# Patient Record
Sex: Female | Born: 1950 | Race: White | Hispanic: No | Marital: Married | Smoking: Current every day smoker
Health system: Southern US, Community
[De-identification: ages and names within clinical notes are randomized; demographics above are authoritative.]

## PROBLEM LIST (undated history)

## (undated) DIAGNOSIS — I499 Cardiac arrhythmia, unspecified: Secondary | ICD-10-CM

## (undated) DIAGNOSIS — D649 Anemia, unspecified: Secondary | ICD-10-CM

## (undated) DIAGNOSIS — G4730 Sleep apnea, unspecified: Secondary | ICD-10-CM

## (undated) DIAGNOSIS — I10 Essential (primary) hypertension: Secondary | ICD-10-CM

## (undated) DIAGNOSIS — Z9981 Dependence on supplemental oxygen: Secondary | ICD-10-CM

## (undated) DIAGNOSIS — R053 Chronic cough: Secondary | ICD-10-CM

## (undated) DIAGNOSIS — R0902 Hypoxemia: Secondary | ICD-10-CM

## (undated) DIAGNOSIS — I4891 Unspecified atrial fibrillation: Secondary | ICD-10-CM

## (undated) DIAGNOSIS — C541 Malignant neoplasm of endometrium: Secondary | ICD-10-CM

## (undated) DIAGNOSIS — C801 Malignant (primary) neoplasm, unspecified: Secondary | ICD-10-CM

## (undated) DIAGNOSIS — R000 Tachycardia, unspecified: Secondary | ICD-10-CM

## (undated) DIAGNOSIS — J189 Pneumonia, unspecified organism: Secondary | ICD-10-CM

## (undated) DIAGNOSIS — J449 Chronic obstructive pulmonary disease, unspecified: Secondary | ICD-10-CM

## (undated) DIAGNOSIS — E669 Obesity, unspecified: Secondary | ICD-10-CM

## (undated) DIAGNOSIS — Z720 Tobacco use: Secondary | ICD-10-CM

## (undated) DIAGNOSIS — R05 Cough: Secondary | ICD-10-CM

## (undated) DIAGNOSIS — K8020 Calculus of gallbladder without cholecystitis without obstruction: Principal | ICD-10-CM

## (undated) DIAGNOSIS — J45909 Unspecified asthma, uncomplicated: Secondary | ICD-10-CM

## (undated) HISTORY — DX: Tobacco use: Z72.0

## (undated) HISTORY — PX: TUBAL LIGATION: SHX77

## (undated) HISTORY — PX: EYE SURGERY: SHX253

## (undated) HISTORY — DX: Unspecified asthma, uncomplicated: J45.909

## (undated) HISTORY — DX: Obesity, unspecified: E66.9

## (undated) HISTORY — PX: ABDOMINAL HYSTERECTOMY: SHX81

## (undated) HISTORY — DX: Malignant neoplasm of endometrium: C54.1

## (undated) HISTORY — DX: Calculus of gallbladder without cholecystitis without obstruction: K80.20

---

## 2012-07-04 ENCOUNTER — Emergency Department: Payer: Self-pay | Admitting: Unknown Physician Specialty

## 2012-07-04 LAB — BASIC METABOLIC PANEL
Anion Gap: 7 (ref 7–16)
BUN: 9 mg/dL (ref 7–18)
Calcium, Total: 8.9 mg/dL (ref 8.5–10.1)
Chloride: 104 mmol/L (ref 98–107)
EGFR (Non-African Amer.): >60
Glucose: 98 mg/dL (ref 65–99)
Osmolality: 274 (ref 275–301)

## 2012-07-04 LAB — CBC WITH DIFFERENTIAL/PLATELET
Basophil #: 0 10*3/uL (ref 0.0–0.1)
Eosinophil #: 0.2 10*3/uL (ref 0.0–0.7)
Eosinophil %: 1.1 %
HCT: 52.9 % — ABNORMAL HIGH (ref 35.0–47.0)
HGB: 18.3 g/dL — ABNORMAL HIGH (ref 12.0–16.0)
Lymphocyte #: 2.7 10*3/uL (ref 1.0–3.6)
MCV: 92 fL (ref 80–100)
Monocyte #: 1.2 x10 3/mm — ABNORMAL HIGH (ref 0.2–0.9)
Monocyte %: 8.5 %
Neutrophil #: 10.2 10*3/uL — ABNORMAL HIGH (ref 1.4–6.5)
Platelet: 203 10*3/uL (ref 150–440)
RBC: 5.72 10*6/uL — ABNORMAL HIGH (ref 3.80–5.20)
WBC: 14.2 10*3/uL — ABNORMAL HIGH (ref 3.6–11.0)

## 2013-12-09 HISTORY — PX: CATARACT EXTRACTION, BILATERAL: SHX1313

## 2016-09-16 ENCOUNTER — Other Ambulatory Visit: Payer: Self-pay | Admitting: Obstetrics and Gynecology

## 2016-09-16 DIAGNOSIS — C541 Malignant neoplasm of endometrium: Secondary | ICD-10-CM

## 2016-09-18 ENCOUNTER — Encounter (INDEPENDENT_AMBULATORY_CARE_PROVIDER_SITE_OTHER): Payer: Self-pay

## 2016-09-18 ENCOUNTER — Encounter: Payer: Self-pay | Admitting: *Deleted

## 2016-09-18 ENCOUNTER — Other Ambulatory Visit: Payer: Self-pay

## 2016-09-18 ENCOUNTER — Inpatient Hospital Stay: Payer: Medicare HMO | Attending: Obstetrics and Gynecology | Admitting: Obstetrics and Gynecology

## 2016-09-18 VITALS — BP 137/82 | HR 93 | Temp 97.6°F | Ht 60.0 in | Wt 226.4 lb

## 2016-09-18 DIAGNOSIS — Z6841 Body Mass Index (BMI) 40.0 and over, adult: Secondary | ICD-10-CM | POA: Insufficient documentation

## 2016-09-18 DIAGNOSIS — E669 Obesity, unspecified: Secondary | ICD-10-CM | POA: Insufficient documentation

## 2016-09-18 DIAGNOSIS — J45909 Unspecified asthma, uncomplicated: Secondary | ICD-10-CM | POA: Insufficient documentation

## 2016-09-18 DIAGNOSIS — R197 Diarrhea, unspecified: Secondary | ICD-10-CM | POA: Insufficient documentation

## 2016-09-18 DIAGNOSIS — K5900 Constipation, unspecified: Secondary | ICD-10-CM | POA: Diagnosis not present

## 2016-09-18 DIAGNOSIS — C541 Malignant neoplasm of endometrium: Secondary | ICD-10-CM | POA: Insufficient documentation

## 2016-09-18 DIAGNOSIS — F17210 Nicotine dependence, cigarettes, uncomplicated: Secondary | ICD-10-CM | POA: Insufficient documentation

## 2016-09-18 DIAGNOSIS — Z780 Asymptomatic menopausal state: Secondary | ICD-10-CM | POA: Insufficient documentation

## 2016-09-18 DIAGNOSIS — G893 Neoplasm related pain (acute) (chronic): Secondary | ICD-10-CM | POA: Diagnosis not present

## 2016-09-18 DIAGNOSIS — Z299 Encounter for prophylactic measures, unspecified: Secondary | ICD-10-CM

## 2016-09-18 NOTE — Progress Notes (Signed)
Patient here for referral. Has complaints of abdominal pain that are constant. She cerrently uses tylenol for pain relief. She has been spotting for "couple of years".

## 2016-09-18 NOTE — Patient Instructions (Signed)
Smoking Cessation, Tips for Success If you are ready to quit smoking, congratulations! You have chosen to help yourself be healthier. Cigarettes bring nicotine, tar, carbon monoxide, and other irritants into your body. Your lungs, heart, and blood vessels will be able to work better without these poisons. There are many different ways to quit smoking. Nicotine gum, nicotine patches, a nicotine inhaler, or nicotine nasal spray can help with physical craving. Hypnosis, support groups, and medicines help break the habit of smoking. WHAT THINGS CAN I DO TO MAKE QUITTING EASIER?  Here are some tips to help you quit for good:  Pick a date when you will quit smoking completely. Tell all of your friends and family about your plan to quit on that date.  Do not try to slowly cut down on the number of cigarettes you are smoking. Pick a quit date and quit smoking completely starting on that day.  Throw away all cigarettes.   Clean and remove all ashtrays from your home, work, and car.  On a card, write down your reasons for quitting. Carry the card with you and read it when you get the urge to smoke.  Cleanse your body of nicotine. Drink enough water and fluids to keep your urine clear or pale yellow. Do this after quitting to flush the nicotine from your body.  Learn to predict your moods. Do not let a bad situation be your excuse to have a cigarette. Some situations in your life might tempt you into wanting a cigarette.  Never have "just one" cigarette. It leads to wanting another and another. Remind yourself of your decision to quit.  Change habits associated with smoking. If you smoked while driving or when feeling stressed, try other activities to replace smoking. Stand up when drinking your coffee. Brush your teeth after eating. Sit in a different chair when you read the paper. Avoid alcohol while trying to quit, and try to drink fewer caffeinated beverages. Alcohol and caffeine may urge you to  smoke.  Avoid foods and drinks that can trigger a desire to smoke, such as sugary or spicy foods and alcohol.  Ask people who smoke not to smoke around you.  Have something planned to do right after eating or having a cup of coffee. For example, plan to take a walk or exercise.  Try a relaxation exercise to calm you down and decrease your stress. Remember, you may be tense and nervous for the first 2 weeks after you quit, but this will pass.  Find new activities to keep your hands busy. Play with a pen, coin, or rubber band. Doodle or draw things on paper.  Brush your teeth right after eating. This will help cut down on the craving for the taste of tobacco after meals. You can also try mouthwash.   Use oral substitutes in place of cigarettes. Try using lemon drops, carrots, cinnamon sticks, or chewing gum. Keep them handy so they are available when you have the urge to smoke.  When you have the urge to smoke, try deep breathing.  Designate your home as a nonsmoking area.  If you are a heavy smoker, ask your health care provider about a prescription for nicotine chewing gum. It can ease your withdrawal from nicotine.  Reward yourself. Set aside the cigarette money you save and buy yourself something nice.  Look for support from others. Join a support group or smoking cessation program. Ask someone at home or at work to help you with your plan   to quit smoking.  Always ask yourself, "Do I need this cigarette or is this just a reflex?" Tell yourself, "Today, I choose not to smoke," or "I do not want to smoke." You are reminding yourself of your decision to quit.  Do not replace cigarette smoking with electronic cigarettes (commonly called e-cigarettes). The safety of e-cigarettes is unknown, and some may contain harmful chemicals.  If you relapse, do not give up! Plan ahead and think about what you will do the next time you get the urge to smoke. HOW WILL I FEEL WHEN I QUIT SMOKING? You  may have symptoms of withdrawal because your body is used to nicotine (the addictive substance in cigarettes). You may crave cigarettes, be irritable, feel very hungry, cough often, get headaches, or have difficulty concentrating. The withdrawal symptoms are only temporary. They are strongest when you first quit but will go away within 10-14 days. When withdrawal symptoms occur, stay in control. Think about your reasons for quitting. Remind yourself that these are signs that your body is healing and getting used to being without cigarettes. Remember that withdrawal symptoms are easier to treat than the major diseases that smoking can cause.  Even after the withdrawal is over, expect periodic urges to smoke. However, these cravings are generally short lived and will go away whether you smoke or not. Do not smoke! WHAT RESOURCES ARE AVAILABLE TO HELP ME QUIT SMOKING? Your health care provider can direct you to community resources or hospitals for support, which may include:  Group support.  Education.  Hypnosis.  Therapy.   This information is not intended to replace advice given to you by your health care provider. Make sure you discuss any questions you have with your health care provider.   Document Released: 08/23/2004 Document Revised: 12/16/2014 Document Reviewed: 05/13/2013 Elsevier Interactive Patient Education 2016 Elsevier Inc.    Uterine Cancer Uterine cancer is an abnormal growth of tissue (tumor) in the uterus that is cancerous (malignant). Unlike noncancerous (benign) tumors, malignant tumors can spread to other parts of your body. The wall of the uterus has two layers of tissue. The inner layer is the endometrium. The outer layer of muscle tissue is the myometrium. The most common type of uterine cancer begins in the endometrium. This is called endometrial cancer. Cancer that begins in the myometrium is called uterine sarcoma, which is very rare.  RISK FACTORS  Although the exact  cause of uterine cancer is unknown, there are a number of risk factors that can increase your chances of getting uterine cancer. They include:  Your age. Uterine cancer occurs mostly in women older than 50 years.   Having an enlarged endometrium (endometrial hyperplasia).   Using hormone therapy.   Obesity.   Taking the drug tamoxifen.   White race.   Infertility.   Never being pregnant.   Beginning menstrual periods at an age younger than 12 years.   Having menstrual periods at an age older than 61 years.   Personal history of ovarian, intestinal, or colorectal cancer.   Having a family history of uterine cancer.   Having a family history of hereditary nonpolyposis colon cancer (HNPCC).   Having diabetes, high blood pressure, thyroid disease, or gallbladder disease.   Long-term use of high-dose birth control pills.   Exposure to radiation.   Smoking.  SIGNS AND SYMPTOMS   Abnormal vaginal bleeding or discharge. Bleeding may start as a watery, blood-streaked flow that gradually contains more blood.   Any vaginal bleeding  after menopause.   Difficult or painful urination.   Pain during intercourse.   Pain in the pelvic area.  Mass in the vagina.  Pain or fullness in the abdomen.  Frequent urination.  Bleeding between periods.  Growth of the stomach.   Unexplained weight loss.  Uterine cancer usually occurs after menopause. However, it may also occur around the time that menopause begins. Abnormal vaginal bleeding is the most common symptom of uterine cancer. Women should not assume that abnormal vaginal bleeding is part of menopause. DIAGNOSIS  Your health care provider will ask about your medical history. He or she may also perform a number of procedures, such as:  A physical and pelvic exam. Your health care provider will feel your pelvis for any lumps.   Blood and urine tests.   X-rays.   Imaging tests, such as CT scans,  ultrasonography, or MRIs.   A hysteroscopy to view the inside of your uterus.   A Pap test to sample cells from the cervix and upper vagina to check for abnormal cells.   Taking a tissue sample (biopsy) from the uterine lining to look for cancer cells.   A dilation and curettage (D&C). This involves stretching (dilation) the cervix and scraping (curettage) the inside lining of the uterus to get a tissue sample. The sample is examined under a microscope to look for cancer cells.  Your cancer will be staged to determine its severity and extent. Staging is a careful attempt to find out the size of the tumor, whether the cancer has spread, and if so, to what parts of the body. You may need to have more tests to determine the stage of your cancer. The test results will help determine what treatment plan is best for you. Cancer stages include:   Stage I. The cancer is only found in the uterus.  Stage II. The cancer has spread to the cervix.  Stage III. The cancer has spread outside the uterus, but not outside the pelvis. The cancer may have spread to the lymph nodes in the pelvis.  Stage IV. The cancer has spread to other parts of the body, such as the bladder or rectum. TREATMENT  Most women with uterine cancer are treated with surgery. This includes removing the uterus, cervix, fallopian tubes, and ovaries (total hysterectomy). Your lymph nodes near the tumor may also be removed. Some women have radiation, chemotherapy, or hormonal therapy. Other women have a combination of these therapies. HOME CARE INSTRUCTIONS   Take medicines only as directed by your health care provider.   Maintain a healthy diet.  Exercise regularly.   If you have diabetes, high blood pressure, thyroid disease, or gallbladder disease, follow your health care provider's instructions to keep it under control.   Do not smoke.   Consider joining a support group. This may help you learn to cope with the stress  of having uterine cancer.   Seek advice to help you manage treatment side effects.   Keep all follow-up visits as directed by your health care provider.  SEEK MEDICAL CARE IF:  You have increased stomach or pelvic pain.  You cannot urinate.  You have abnormal bleeding.   This information is not intended to replace advice given to you by your health care provider. Make sure you discuss any questions you have with your health care provider.   Document Released: 11/25/2005 Document Revised: 12/16/2014 Document Reviewed: 05/14/2013 Elsevier Interactive Patient Education [employer].

## 2016-09-18 NOTE — Progress Notes (Signed)
Gynecologic Oncology Consult Visit   Referring Provider: Boykin Nearing, MD [address] [facility] [place], [place] [address] [phone]   Chief Concern: endometrial cancer  Subjective:  Jessica Dalton is a 65 y.o. female who is seen in consultation from Dr. Ouida Sills for grade 1 endometrial cancer.  She was referred to Dr. Ouida Sills by Dr. Netty Starring for PMB.  grade 1 endometrial cancer. EMBx and Pap performed on 09/09/2016. Uterus sounded to 10 cm. 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 Pap NILM.   Dr. Ouida Sills also ordered a CT scan to evaluate upper abdominal pain symptoms.     Problem List: Patient Active Problem List   Diagnosis Date Noted  . Endometrial cancer ([place]) 09/18/2016    Past Medical History: Past Medical History:  Diagnosis Date  . Asthma   . Obesity   . Tobacco use     Past Surgical History: Past Surgical History:  Procedure Laterality Date  . TUBAL LIGATION      Past Gynecologic History:  Menarche: unknown Menstrual details: postmenopausal Last Menstrual Period: 15 years ago History of OCP/HRT use: None History of Abnormal pap: no, benign cellular changes Last pap: as per HPI   OB History:  OB History  Gravida Para Term Preterm AB Living  3 3 3     3   SAB TAB Ectopic Multiple Live Births               # Outcome Date GA Lbr Len/2nd Weight Sex Delivery Anes PTL Lv  3 Term           2 Term           1 Term               Family History: Family History  Problem Relation Age of Onset  . Cancer Mother     renal cell  . Cancer Father     colon cancer  . Cancer Sister     melanoma    Social History: Social History   Social History  . Marital status: Married    Spouse name: N/A  . Number of children: N/A  . Years of education: N/A   Occupational History  . Not on file.   Social History Main Topics  . Smoking status: Current Every Day Smoker    Packs/day: 1.00   . Smokeless tobacco: Never Used     Comment: 74 pk yr - 1 ppd started age 9  . Alcohol use No  . Drug use: No  . Sexual activity: Not on file   Other Topics Concern  . Not on file   Social History Narrative  . No narrative on file    Allergies: No Known Allergies  Current Medications: No current outpatient prescriptions on file.   No current facility-administered medications for this visit.     Review of Systems General: night sweats  HEENT: no complaints  Lungs: cough, wheezing; no h/o severe asthma attacks  Cardiac: no complaints; specifically no chest pain or discomfort with activity.   GI: diarrhea/constipation/persistent diffuse abdominal pain  GU: PMB; urinary frequency  Musculoskeletal: no complaints  Extremities: no complaints  Skin: no complaints  Neuro: no complaints  Endocrine: no complaints  Psych: no complaints       Objective:  Physical Examination:  BP 137/82 (BP Location: Left Arm, Patient Position: Sitting)   Pulse 93   Temp 97.6 F (36.4 C) (Tympanic)   Ht 5' (1.524 m)  Wt 226 lb 6.6 oz (102.7 kg)   BMI 44.22 kg/m    ECOG Performance Status: 0 - Asymptomatic  General appearance: alert, cooperative and appears stated age HEENT:PERRLA, extra ocular movement intact and sclera clear, anicteric Lymph node survey: non-palpable, axillary, inguinal, supraclavicular Cardiovascular: regular rate and rhythm Respiratory: normal air entry, lungs clear to auscultation but decreased air movement in bilateral posterior lower fields. Abdomen: soft, non-tender, without masses or organomegaly and no hernias. Obese.  Back: inspection of back is normal Extremities: extremities normal, atraumatic, no cyanosis or edema Neurological exam reveals alert, oriented, normal speech, no focal findings or movement disorder noted.  Pelvic: exam chaperoned by nurse;  Vulva: normal appearing vulva with no masses, tenderness or lesions; Vagina: normal vagina; Adnexa:  limited by exam but no obvious masses; Uterus:  limited by exam, unable to determine normal size, shape, consistency, but uterus seems mobile and nontender; Cervix: no lesions; Rectal: not indicated   Lab Review Labs on site today: Pending  Radiologic Imaging: CXR pending    Assessment:  Jessica Dalton is a 65 y.o. female diagnosed with grade 1 endometrioid endometrial cancer. Medical co-morbidities complicating care: asthma, tobacco use, and obesity.  Plan:   Problem List Items Addressed This Visit      Genitourinary   Endometrial cancer ([place]) - Primary    Other Visit Diagnoses   None.     We discussed options for management including hormonal therapy, radiation, and surgery. Based on her history and optimal outcomes , we recommend definitive surgical evaluationwith either laparoscopic or robotic TLH, BSO, SLN injection, mapping, and biopsy. We will order preop labs including HbA1c, CXR, and EKG. We will defer to Preop screening regarding the need for PFTs. She has not had PFTs per her knowledge and she is very inactive so difficult to determine her pulmonary function.   I recommended that she stop smoking in preparation for surgery.   Risks were discussed in detail. These include infection, anesthesia, bleeding, transfusion, wound separation, medical issues (blood clots, stroke, heart attack, fluid in the lungs, pneumonia, abnormal heart rhythm, death), possible exploratory surgery with larger incision, lymphedema, lymphocyst, allergic reaction.   Suggested return to clinic in  4 - 6 weeks for postoperative evaluation.    The patient's diagnosis, an outline of the further diagnostic and laboratory studies which will be required, the recommendation, and alternatives were discussed.  All questions were answered to the patient's satisfaction.  A total of 90 minutes were spent in education, counseling and coordination of care for endometrial cancer.    Gillis Ends,  MD    CC:  Boykin Nearing, MD [address] [facility] [place], [place] [address] [phone]  Dr. Dion Body

## 2016-09-18 NOTE — Progress Notes (Signed)
  Oncology Nurse Navigator Documentation Chaperoned pelvic exam. Will arrange surgery with patient when date/assistant surgeon available.  Navigator Location: [facility] (09/18/16 1000) Navigator Encounter Type: Clinic/MDC (09/18/16 1000)                                Acuity: Level 3 (09/18/16 1000)     Acuity Level 3: Coordination of multimodality treatment;Emotional needs;Ongoing guidance and education provided throughout treatment (09/18/16 1000)   Time Spent with Patient: 45 (09/18/16 1000)

## 2016-09-19 ENCOUNTER — Telehealth: Payer: Self-pay

## 2016-09-19 NOTE — Telephone Encounter (Signed)
  Oncology Nurse Navigator Documentation Received call from [place] at [facility]. Surgery will be with Dr Beasley/[place] on 10/25. Ms. Stoutamire notified. She will be contacted regarding her preop appt when available. Dr. Leafy Ro will also like to see her prior to surgery. [place] will call her forthat appt.  Navigator Location: [facility] (09/19/16 1100) Navigator Encounter Type: Telephone (09/19/16 1100) Telephone:  (surgery) (09/19/16 1100)                                        Time Spent with Patient: 15 (09/19/16 1100)

## 2016-09-19 NOTE — Telephone Encounter (Signed)
  Oncology Nurse Navigator Documentation Spoke with Anderson Malta at [facility]. Dates of 10/18 and 10/25 provided as possible surgery dates. She will call me back when a date is confirmed. Navigator Location: [facility] (09/19/16 1000) Navigator Encounter Type: Telephone (09/19/16 1000)                                          Time Spent with Patient: 15 (09/19/16 1000)

## 2016-09-20 ENCOUNTER — Other Ambulatory Visit: Payer: Self-pay

## 2016-09-20 NOTE — Progress Notes (Signed)
  Oncology Nurse Navigator Documentation Ms. Porreca surgical booking has been completed and sent to scheduling. Hgb A1c order added per Dr. Theora Gianotti. Message sent that this is needed during preop visit. Spoke with preop and they stated orders will be released once surgery scheduled, no date change required on the surgery orders. Navigator Location: [facility] (09/20/16 1100) Navigator Encounter Type: Other (surgery booking) (09/20/16 1100)                                          Time Spent with Patient: 60 (09/20/16 1100)

## 2016-09-23 ENCOUNTER — Telehealth: Payer: Self-pay

## 2016-09-23 NOTE — Telephone Encounter (Signed)
  Oncology Nurse Navigator Documentation Notified [name] that the OR is unavailable 10/25. Per Dr. Fransisca Connors reschedule surgery for 11/1. Awaiting confirmation of [place] assist. Navigator Location: [facility] (09/23/16 0900) Navigator Encounter Type: Telephone (09/23/16 0900)                                          Time Spent with Patient: 15 (09/23/16 0900)

## 2016-09-24 ENCOUNTER — Ambulatory Visit
Admission: RE | Admit: 2016-09-24 | Discharge: 2016-09-24 | Disposition: A | Discharge status: Home / Self Care | Payer: Medicare HMO | Source: Ambulatory Visit | Attending: Obstetrics and Gynecology | Admitting: Obstetrics and Gynecology

## 2016-09-24 DIAGNOSIS — C541 Malignant neoplasm of endometrium: Secondary | ICD-10-CM | POA: Diagnosis present

## 2016-09-24 DIAGNOSIS — E278 Other specified disorders of adrenal gland: Secondary | ICD-10-CM | POA: Insufficient documentation

## 2016-09-24 DIAGNOSIS — R590 Localized enlarged lymph nodes: Secondary | ICD-10-CM | POA: Diagnosis not present

## 2016-09-24 HISTORY — DX: Malignant (primary) neoplasm, unspecified: C80.1

## 2016-09-24 LAB — POCT I-STAT CREATININE: Creatinine, Ser: 0.6 mg/dL (ref 0.44–1.00)

## 2016-09-24 MED ORDER — IOPAMIDOL (ISOVUE-300) INJECTION 61%
100.0000 mL | Freq: Once | INTRAVENOUS | Status: AC | PRN
  Administered 2016-09-24: 100 mL via INTRAVENOUS

## 2016-10-07 ENCOUNTER — Inpatient Hospital Stay: Admission: RE | Admit: 2016-10-07 | Payer: Medicare HMO | Source: Ambulatory Visit

## 2016-10-10 ENCOUNTER — Encounter
Admission: RE | Admit: 2016-10-10 | Discharge: 2016-10-10 | Disposition: A | Discharge status: Home / Self Care | Payer: Medicare HMO | Source: Ambulatory Visit | Attending: Obstetrics and Gynecology | Admitting: Obstetrics and Gynecology

## 2016-10-10 ENCOUNTER — Ambulatory Visit
Admission: RE | Admit: 2016-10-10 | Discharge: 2016-10-10 | Disposition: A | Discharge status: Home / Self Care | Payer: Medicare HMO | Source: Ambulatory Visit | Attending: Anesthesiology | Admitting: Anesthesiology

## 2016-10-10 DIAGNOSIS — D649 Anemia, unspecified: Secondary | ICD-10-CM | POA: Diagnosis not present

## 2016-10-10 DIAGNOSIS — Z01810 Encounter for preprocedural cardiovascular examination: Secondary | ICD-10-CM | POA: Insufficient documentation

## 2016-10-10 DIAGNOSIS — Z01812 Encounter for preprocedural laboratory examination: Secondary | ICD-10-CM | POA: Diagnosis not present

## 2016-10-10 DIAGNOSIS — J410 Simple chronic bronchitis: Secondary | ICD-10-CM | POA: Insufficient documentation

## 2016-10-10 DIAGNOSIS — E669 Obesity, unspecified: Secondary | ICD-10-CM | POA: Insufficient documentation

## 2016-10-10 DIAGNOSIS — C541 Malignant neoplasm of endometrium: Secondary | ICD-10-CM | POA: Insufficient documentation

## 2016-10-10 DIAGNOSIS — I700 Atherosclerosis of aorta: Secondary | ICD-10-CM | POA: Insufficient documentation

## 2016-10-10 DIAGNOSIS — F17200 Nicotine dependence, unspecified, uncomplicated: Secondary | ICD-10-CM | POA: Insufficient documentation

## 2016-10-10 HISTORY — DX: Anemia, unspecified: D64.9

## 2016-10-10 LAB — CBC
HCT: 53.4 % — ABNORMAL HIGH (ref 35.0–47.0)
HEMOGLOBIN: 18.2 g/dL — AB (ref 12.0–16.0)
MCH: 31.8 pg (ref 26.0–34.0)
MCHC: 34 g/dL (ref 32.0–36.0)
MCV: 93.6 fL (ref 80.0–100.0)
PLATELETS: 192 10*3/uL (ref 150–440)
RBC: 5.71 MIL/uL — AB (ref 3.80–5.20)
RDW: 13.3 % (ref 11.5–14.5)
WBC: 9.1 10*3/uL (ref 3.6–11.0)

## 2016-10-10 LAB — COMPREHENSIVE METABOLIC PANEL
ALK PHOS: 79 U/L (ref 38–126)
ALT: 11 U/L — AB (ref 14–54)
AST: 13 U/L — AB (ref 15–41)
Albumin: 4.1 g/dL (ref 3.5–5.0)
Anion gap: 6 (ref 5–15)
BUN: 11 mg/dL (ref 6–20)
CALCIUM: 9 mg/dL (ref 8.9–10.3)
CHLORIDE: 102 mmol/L (ref 101–111)
CO2: 31 mmol/L (ref 22–32)
CREATININE: 0.55 mg/dL (ref 0.44–1.00)
GFR calc Af Amer: >60 mL/min (ref >60)
Glucose, Bld: 92 mg/dL (ref 65–99)
Potassium: 4.1 mmol/L (ref 3.5–5.1)
Sodium: 139 mmol/L (ref 135–145)
Total Bilirubin: 0.4 mg/dL (ref 0.3–1.2)
Total Protein: 7.2 g/dL (ref 6.5–8.1)

## 2016-10-10 LAB — TYPE AND SCREEN
ABO/RH(D): A POS
Antibody Screen: NEGATIVE

## 2016-10-10 NOTE — Patient Instructions (Signed)
Your procedure is scheduled on: Wednesday 10/16/16 Report to Day Surgery. 2ND FLOOR MEDICAL MALL ENTRANCE To find out your arrival time please call [phone] between 1PM - 3PM on Tuesday 10/15/16.  Remember: Instructions that are not followed completely may result in serious medical risk, up to and including death, or upon the discretion of your surgeon and anesthesiologist your surgery may need to be rescheduled.    __X__ 1. Do not eat food or drink liquids after midnight. No gum chewing or hard candies.     __X__ 2. No Alcohol for 24 hours before or after surgery.   ____ 3. Bring all medications with you on the day of surgery if instructed.    __X__ 4. Notify your doctor if there is any change in your medical condition     (cold, fever, infections).     Do not wear jewelry, make-up, hairpins, clips or nail polish.  Do not wear lotions, powders, or perfumes.   Do not shave 48 hours prior to surgery. Men may shave face and neck.  Do not bring valuables to the hospital.    [facility] is not responsible for any belongings or valuables.               Contacts, dentures or bridgework may not be worn into surgery.  Leave your suitcase in the car. After surgery it may be brought to your room.  For patients admitted to the hospital, discharge time is determined by your                treatment team.   Patients discharged the day of surgery will not be allowed to drive home.   Please read over the following fact sheets that you were given:   Pain Booklet   ____ Take these medicines the morning of surgery with A SIP OF WATER:    1. NONE  2.   3.   4.  5.  6.  ____ Fleet Enema (as directed)   __X__ Use CHG Soap as directed  ____ Use inhalers on the day of surgery  ____ Stop metformin 2 days prior to surgery    ____ Take 1/2 of usual insulin dose the night before surgery and none on the morning of surgery.   ____ Stop Coumadin/Plavix/aspirin on   __X__ Stop  Anti-inflammatories on STOP IBUPROFEN TODAY   ____ Stop supplements until after surgery.    ____ Bring C-Pap to the hospital.

## 2016-10-11 ENCOUNTER — Other Ambulatory Visit: Payer: Self-pay

## 2016-10-11 DIAGNOSIS — C541 Malignant neoplasm of endometrium: Secondary | ICD-10-CM

## 2016-10-12 LAB — HEMOGLOBIN A1C
HEMOGLOBIN A1C: 5.5 % (ref 4.8–5.6)
MEAN PLASMA GLUCOSE: 111 mg/dL

## 2016-10-15 NOTE — Pre-Procedure Instructions (Signed)
CLEARED LOW RISK BY DR Netty Starring. RECHECK HGB 12.8 ON 10/14/16. RECOMMENDS F/U WITH HEMATOLOGY. [place] PATIENT AGAIN 12/12

## 2016-10-16 ENCOUNTER — Encounter: Payer: Self-pay | Admitting: *Deleted

## 2016-10-16 ENCOUNTER — Observation Stay
Admission: RE | Admit: 2016-10-16 | Discharge: 2016-10-18 | Disposition: A | Discharge status: Home / Self Care | Payer: Medicare HMO | Source: Ambulatory Visit | Attending: Obstetrics and Gynecology | Admitting: Obstetrics and Gynecology

## 2016-10-16 ENCOUNTER — Inpatient Hospital Stay: Payer: Medicare HMO | Admitting: Anesthesiology

## 2016-10-16 ENCOUNTER — Observation Stay: Payer: Medicare HMO

## 2016-10-16 ENCOUNTER — Encounter
Admission: RE | Disposition: A | Discharge status: Home / Self Care | Payer: Self-pay | Source: Ambulatory Visit | Attending: Obstetrics and Gynecology

## 2016-10-16 ENCOUNTER — Other Ambulatory Visit: Payer: Self-pay

## 2016-10-16 DIAGNOSIS — D751 Secondary polycythemia: Secondary | ICD-10-CM | POA: Insufficient documentation

## 2016-10-16 DIAGNOSIS — J9589 Other postprocedural complications and disorders of respiratory system, not elsewhere classified: Secondary | ICD-10-CM | POA: Insufficient documentation

## 2016-10-16 DIAGNOSIS — J449 Chronic obstructive pulmonary disease, unspecified: Secondary | ICD-10-CM | POA: Insufficient documentation

## 2016-10-16 DIAGNOSIS — R000 Tachycardia, unspecified: Secondary | ICD-10-CM

## 2016-10-16 DIAGNOSIS — E669 Obesity, unspecified: Secondary | ICD-10-CM | POA: Insufficient documentation

## 2016-10-16 DIAGNOSIS — R062 Wheezing: Secondary | ICD-10-CM

## 2016-10-16 DIAGNOSIS — Z800 Family history of malignant neoplasm of digestive organs: Secondary | ICD-10-CM | POA: Insufficient documentation

## 2016-10-16 DIAGNOSIS — C541 Malignant neoplasm of endometrium: Principal | ICD-10-CM | POA: Insufficient documentation

## 2016-10-16 DIAGNOSIS — Z8051 Family history of malignant neoplasm of kidney: Secondary | ICD-10-CM | POA: Insufficient documentation

## 2016-10-16 DIAGNOSIS — F17210 Nicotine dependence, cigarettes, uncomplicated: Secondary | ICD-10-CM | POA: Diagnosis not present

## 2016-10-16 DIAGNOSIS — Z299 Encounter for prophylactic measures, unspecified: Secondary | ICD-10-CM

## 2016-10-16 DIAGNOSIS — D72829 Elevated white blood cell count, unspecified: Secondary | ICD-10-CM | POA: Diagnosis not present

## 2016-10-16 DIAGNOSIS — D259 Leiomyoma of uterus, unspecified: Secondary | ICD-10-CM | POA: Insufficient documentation

## 2016-10-16 DIAGNOSIS — J9601 Acute respiratory failure with hypoxia: Secondary | ICD-10-CM | POA: Diagnosis not present

## 2016-10-16 DIAGNOSIS — I472 Ventricular tachycardia: Secondary | ICD-10-CM | POA: Insufficient documentation

## 2016-10-16 DIAGNOSIS — N72 Inflammatory disease of cervix uteri: Secondary | ICD-10-CM | POA: Diagnosis not present

## 2016-10-16 DIAGNOSIS — D649 Anemia, unspecified: Secondary | ICD-10-CM | POA: Insufficient documentation

## 2016-10-16 DIAGNOSIS — Z6841 Body Mass Index (BMI) 40.0 and over, adult: Secondary | ICD-10-CM | POA: Insufficient documentation

## 2016-10-16 DIAGNOSIS — K8020 Calculus of gallbladder without cholecystitis without obstruction: Secondary | ICD-10-CM | POA: Insufficient documentation

## 2016-10-16 DIAGNOSIS — I4891 Unspecified atrial fibrillation: Secondary | ICD-10-CM | POA: Insufficient documentation

## 2016-10-16 DIAGNOSIS — R0902 Hypoxemia: Secondary | ICD-10-CM

## 2016-10-16 HISTORY — DX: Calculus of gallbladder without cholecystitis without obstruction: K80.20

## 2016-10-16 HISTORY — PX: PELVIC LYMPH NODE DISSECTION: SHX6543

## 2016-10-16 HISTORY — PX: SENTINEL NODE BIOPSY: SHX6608

## 2016-10-16 HISTORY — PX: ROBOTIC ASSISTED TOTAL HYSTERECTOMY WITH BILATERAL SALPINGO OOPHERECTOMY: SHX6086

## 2016-10-16 LAB — CBC WITH DIFFERENTIAL/PLATELET
Basophils Absolute: 0 10*3/uL (ref 0–0.1)
Basophils Relative: 0 %
EOS PCT: 0 %
Eosinophils Absolute: 0 10*3/uL (ref 0–0.7)
HCT: 52.7 % — ABNORMAL HIGH (ref 35.0–47.0)
Hemoglobin: 17.7 g/dL — ABNORMAL HIGH (ref 12.0–16.0)
LYMPHS ABS: 0.5 10*3/uL — AB (ref 1.0–3.6)
Lymphocytes Relative: 3 %
MCH: 31 pg (ref 26.0–34.0)
MCHC: 33.6 g/dL (ref 32.0–36.0)
MCV: 92.1 fL (ref 80.0–100.0)
MONOS PCT: 1 %
Monocytes Absolute: 0.2 10*3/uL (ref 0.2–0.9)
Neutro Abs: 17.1 10*3/uL — ABNORMAL HIGH (ref 1.4–6.5)
Neutrophils Relative %: 96 %
Platelets: 169 10*3/uL (ref 150–440)
RBC: 5.72 MIL/uL — AB (ref 3.80–5.20)
RDW: 13.3 % (ref 11.5–14.5)
WBC: 17.8 10*3/uL — AB (ref 3.6–11.0)

## 2016-10-16 LAB — BASIC METABOLIC PANEL
ANION GAP: 9 (ref 5–15)
BUN: 9 mg/dL (ref 6–20)
CALCIUM: 8.7 mg/dL — AB (ref 8.9–10.3)
CO2: 26 mmol/L (ref 22–32)
Chloride: 104 mmol/L (ref 101–111)
Creatinine, Ser: 0.57 mg/dL (ref 0.44–1.00)
GFR calc Af Amer: >60 mL/min (ref >60)
GFR calc non Af Amer: >60 mL/min (ref >60)
GLUCOSE: 165 mg/dL — AB (ref 65–99)
Potassium: 4 mmol/L (ref 3.5–5.1)
Sodium: 139 mmol/L (ref 135–145)

## 2016-10-16 LAB — TROPONIN I
Troponin I: <0.03 ng/mL (ref <0.03)
Troponin I: <0.03 ng/mL (ref <0.03)

## 2016-10-16 LAB — ABO/RH: ABO/RH(D): A POS

## 2016-10-16 LAB — MAGNESIUM: Magnesium: 1.7 mg/dL (ref 1.7–2.4)

## 2016-10-16 SURGERY — ROBOTIC ASSISTED TOTAL HYSTERECTOMY WITH BILATERAL SALPINGO OOPHORECTOMY
Anesthesia: General

## 2016-10-16 MED ORDER — METHYLPREDNISOLONE SODIUM SUCC 40 MG IJ SOLR
40.0000 mg | Freq: Two times a day (BID) | INTRAMUSCULAR | Status: DC
  Administered 2016-10-16: 40 mg via INTRAVENOUS
  Filled 2016-10-16: qty 1

## 2016-10-16 MED ORDER — ALBUTEROL SULFATE (2.5 MG/3ML) 0.083% IN NEBU: 2.5000 mg | INHALATION_SOLUTION | Freq: Four times a day (QID) | RESPIRATORY_TRACT | Status: DC | PRN

## 2016-10-16 MED ORDER — IBUPROFEN 400 MG PO TABS
600.0000 mg | ORAL_TABLET | Freq: Four times a day (QID) | ORAL | Status: DC | PRN
  Administered 2016-10-16: 600 mg via ORAL
  Filled 2016-10-16: qty 2

## 2016-10-16 MED ORDER — FENTANYL CITRATE (PF) 100 MCG/2ML IJ SOLN
INTRAMUSCULAR | Status: AC
  Administered 2016-10-16: 25 ug via INTRAVENOUS
  Filled 2016-10-16: qty 2

## 2016-10-16 MED ORDER — LACTATED RINGERS IV SOLN
INTRAVENOUS | Status: DC
  Administered 2016-10-16 (×3): via INTRAVENOUS

## 2016-10-16 MED ORDER — LEVALBUTEROL HCL 1.25 MG/0.5ML IN NEBU: 1.2500 mg | INHALATION_SOLUTION | Freq: Four times a day (QID) | RESPIRATORY_TRACT | Status: DC

## 2016-10-16 MED ORDER — CEFAZOLIN SODIUM-DEXTROSE 2-4 GM/100ML-% IV SOLN
INTRAVENOUS | Status: AC
  Filled 2016-10-16: qty 100

## 2016-10-16 MED ORDER — ONDANSETRON HCL 4 MG/2ML IJ SOLN
INTRAMUSCULAR | Status: DC | PRN
  Administered 2016-10-16: 4 mg via INTRAVENOUS

## 2016-10-16 MED ORDER — KETOROLAC TROMETHAMINE 30 MG/ML IJ SOLN: 30.0000 mg | Freq: Once | INTRAMUSCULAR | Status: DC

## 2016-10-16 MED ORDER — MIDAZOLAM HCL 2 MG/2ML IJ SOLN
INTRAMUSCULAR | Status: DC | PRN
  Administered 2016-10-16: 2 mg via INTRAVENOUS

## 2016-10-16 MED ORDER — INDOCYANINE GREEN 25 MG IV SOLR
INTRAVENOUS | Status: DC | PRN
  Administered 2016-10-16: 20 mg

## 2016-10-16 MED ORDER — METOPROLOL TARTRATE 5 MG/5ML IV SOLN: 2.5000 mg | Freq: Four times a day (QID) | INTRAVENOUS | Status: DC | PRN

## 2016-10-16 MED ORDER — ONDANSETRON HCL 4 MG/2ML IJ SOLN
4.0000 mg | Freq: Four times a day (QID) | INTRAMUSCULAR | Status: DC | PRN
  Administered 2016-10-16 (×2): 4 mg via INTRAVENOUS
  Filled 2016-10-16 (×2): qty 2

## 2016-10-16 MED ORDER — MORPHINE SULFATE (PF) 4 MG/ML IV SOLN
1.0000 mg | INTRAVENOUS | Status: DC | PRN
  Administered 2016-10-17: 2 mg via INTRAVENOUS
  Filled 2016-10-16: qty 1

## 2016-10-16 MED ORDER — METHYLPREDNISOLONE SODIUM SUCC 125 MG IJ SOLR
125.0000 mg | Freq: Once | INTRAMUSCULAR | Status: DC
  Filled 2016-10-16: qty 2

## 2016-10-16 MED ORDER — BUPIVACAINE HCL (PF) 0.5 % IJ SOLN
INTRAMUSCULAR | Status: AC
  Filled 2016-10-16: qty 30

## 2016-10-16 MED ORDER — FENTANYL CITRATE (PF) 100 MCG/2ML IJ SOLN
INTRAMUSCULAR | Status: AC
  Filled 2016-10-16: qty 2

## 2016-10-16 MED ORDER — FAMOTIDINE 20 MG PO TABS
ORAL_TABLET | ORAL | Status: AC
  Administered 2016-10-16: 20 mg via ORAL
  Filled 2016-10-16: qty 1

## 2016-10-16 MED ORDER — ALBUTEROL SULFATE (2.5 MG/3ML) 0.083% IN NEBU: 2.5000 mg | INHALATION_SOLUTION | RESPIRATORY_TRACT | Status: DC

## 2016-10-16 MED ORDER — IPRATROPIUM-ALBUTEROL 0.5-2.5 (3) MG/3ML IN SOLN
RESPIRATORY_TRACT | Status: AC
  Filled 2016-10-16: qty 3

## 2016-10-16 MED ORDER — BUPIVACAINE HCL 0.5 % IJ SOLN
INTRAMUSCULAR | Status: DC | PRN
  Administered 2016-10-16: 16 mL

## 2016-10-16 MED ORDER — PROPOFOL 10 MG/ML IV BOLUS
INTRAVENOUS | Status: DC | PRN
  Administered 2016-10-16: 140 mg via INTRAVENOUS

## 2016-10-16 MED ORDER — IPRATROPIUM-ALBUTEROL 0.5-2.5 (3) MG/3ML IN SOLN
RESPIRATORY_TRACT | Status: AC
  Administered 2016-10-16: 3 mL
  Filled 2016-10-16: qty 3

## 2016-10-16 MED ORDER — IPRATROPIUM-ALBUTEROL 0.5-2.5 (3) MG/3ML IN SOLN: 3.0000 mL | Freq: Four times a day (QID) | RESPIRATORY_TRACT | Status: DC

## 2016-10-16 MED ORDER — FENTANYL CITRATE (PF) 100 MCG/2ML IJ SOLN
INTRAMUSCULAR | Status: DC | PRN
  Administered 2016-10-16: 100 ug via INTRAVENOUS
  Administered 2016-10-16 (×2): 50 ug via INTRAVENOUS

## 2016-10-16 MED ORDER — MENTHOL 3 MG MT LOZG
1.0000 | LOZENGE | OROMUCOSAL | Status: DC | PRN
  Filled 2016-10-16: qty 9

## 2016-10-16 MED ORDER — FENTANYL CITRATE (PF) 100 MCG/2ML IJ SOLN
25.0000 ug | INTRAMUSCULAR | Status: DC | PRN
  Administered 2016-10-16 (×5): 25 ug via INTRAVENOUS

## 2016-10-16 MED ORDER — LACTATED RINGERS IV SOLN
INTRAVENOUS | Status: DC
  Administered 2016-10-16 – 2016-10-17 (×4): via INTRAVENOUS
  Administered 2016-10-17: 125 mL/h via INTRAVENOUS
  Administered 2016-10-18: 12:00:00 via INTRAVENOUS
  Administered 2016-10-18: 125 mL/h via INTRAVENOUS

## 2016-10-16 MED ORDER — ONDANSETRON HCL 4 MG PO TABS: 4.0000 mg | ORAL_TABLET | Freq: Four times a day (QID) | ORAL | Status: DC | PRN

## 2016-10-16 MED ORDER — LABETALOL HCL 5 MG/ML IV SOLN
INTRAVENOUS | Status: DC | PRN
  Administered 2016-10-16: 5 mg via INTRAVENOUS

## 2016-10-16 MED ORDER — MORPHINE SULFATE (PF) 2 MG/ML IV SOLN: 1.0000 mg | INTRAVENOUS | Status: DC | PRN

## 2016-10-16 MED ORDER — INDOCYANINE GREEN 25 MG IV SOLR
INTRAVENOUS | Status: AC
  Filled 2016-10-16: qty 25

## 2016-10-16 MED ORDER — ONDANSETRON HCL 4 MG/2ML IJ SOLN: 4.0000 mg | Freq: Once | INTRAMUSCULAR | Status: DC | PRN

## 2016-10-16 MED ORDER — ROCURONIUM BROMIDE 100 MG/10ML IV SOLN
INTRAVENOUS | Status: DC | PRN
  Administered 2016-10-16: 30 mg via INTRAVENOUS
  Administered 2016-10-16: 50 mg via INTRAVENOUS
  Administered 2016-10-16: 20 mg via INTRAVENOUS

## 2016-10-16 MED ORDER — IPRATROPIUM-ALBUTEROL 0.5-2.5 (3) MG/3ML IN SOLN
3.0000 mL | RESPIRATORY_TRACT | Status: DC
  Filled 2016-10-16: qty 3

## 2016-10-16 MED ORDER — SUCCINYLCHOLINE CHLORIDE 20 MG/ML IJ SOLN
INTRAMUSCULAR | Status: DC | PRN
  Administered 2016-10-16: 100 mg via INTRAVENOUS

## 2016-10-16 MED ORDER — CEFAZOLIN SODIUM-DEXTROSE 2-4 GM/100ML-% IV SOLN
2.0000 g | INTRAVENOUS | Status: AC
  Administered 2016-10-16: 2 g via INTRAVENOUS

## 2016-10-16 MED ORDER — POLYETHYLENE GLYCOL 3350 17 G PO PACK
17.0000 g | PACK | Freq: Every day | ORAL | Status: DC | PRN
  Filled 2016-10-16: qty 1

## 2016-10-16 MED ORDER — METOPROLOL TARTRATE 5 MG/5ML IV SOLN
2.5000 mg | Freq: Once | INTRAVENOUS | Status: AC
  Administered 2016-10-16: 2.5 mg via INTRAVENOUS

## 2016-10-16 MED ORDER — LEVALBUTEROL HCL 0.63 MG/3ML IN NEBU
0.6300 mg | INHALATION_SOLUTION | Freq: Four times a day (QID) | RESPIRATORY_TRACT | Status: DC | PRN
  Filled 2016-10-16: qty 3

## 2016-10-16 MED ORDER — METHYLPREDNISOLONE SODIUM SUCC 125 MG IJ SOLR
INTRAMUSCULAR | Status: AC
  Filled 2016-10-16: qty 2

## 2016-10-16 MED ORDER — EPHEDRINE SULFATE 50 MG/ML IJ SOLN
INTRAMUSCULAR | Status: DC | PRN
  Administered 2016-10-16 (×2): 10 mg via INTRAVENOUS

## 2016-10-16 MED ORDER — OXYCODONE-ACETAMINOPHEN 5-325 MG PO TABS
1.0000 | ORAL_TABLET | ORAL | Status: DC | PRN
  Administered 2016-10-16 – 2016-10-18 (×5): 2 via ORAL
  Filled 2016-10-16 (×5): qty 2

## 2016-10-16 MED ORDER — METHYLPREDNISOLONE SODIUM SUCC 125 MG IJ SOLR
125.0000 mg | Freq: Once | INTRAMUSCULAR | Status: AC
  Administered 2016-10-16: 125 mg via INTRAVENOUS

## 2016-10-16 MED ORDER — SUGAMMADEX SODIUM 500 MG/5ML IV SOLN
INTRAVENOUS | Status: DC | PRN
  Administered 2016-10-16: 200 mg via INTRAVENOUS

## 2016-10-16 MED ORDER — HYDROMORPHONE HCL 1 MG/ML IJ SOLN: 0.2000 mg | INTRAMUSCULAR | Status: DC | PRN

## 2016-10-16 MED ORDER — IPRATROPIUM-ALBUTEROL 0.5-2.5 (3) MG/3ML IN SOLN
3.0000 mL | Freq: Once | RESPIRATORY_TRACT | Status: AC
  Administered 2016-10-16: 3 mL via RESPIRATORY_TRACT

## 2016-10-16 MED ORDER — FAMOTIDINE 20 MG PO TABS
20.0000 mg | ORAL_TABLET | Freq: Once | ORAL | Status: AC
  Administered 2016-10-16: 20 mg via ORAL

## 2016-10-16 MED ORDER — DOCUSATE SODIUM 100 MG PO CAPS
100.0000 mg | ORAL_CAPSULE | Freq: Two times a day (BID) | ORAL | Status: DC
  Administered 2016-10-16 – 2016-10-18 (×4): 100 mg via ORAL
  Filled 2016-10-16 (×4): qty 1

## 2016-10-16 MED ORDER — LIDOCAINE HCL (CARDIAC) 20 MG/ML IV SOLN
INTRAVENOUS | Status: DC | PRN
  Administered 2016-10-16: 50 mg via INTRAVENOUS

## 2016-10-16 MED ORDER — LEVALBUTEROL HCL 0.63 MG/3ML IN NEBU
0.6300 mg | INHALATION_SOLUTION | RESPIRATORY_TRACT | Status: DC
  Administered 2016-10-16 – 2016-10-17 (×4): 0.63 mg via RESPIRATORY_TRACT
  Filled 2016-10-16 (×5): qty 3

## 2016-10-16 MED ORDER — METOPROLOL TARTRATE 5 MG/5ML IV SOLN
INTRAVENOUS | Status: AC
  Administered 2016-10-16: 2.5 mg via INTRAVENOUS
  Filled 2016-10-16: qty 5

## 2016-10-16 MED ORDER — BENEFIBER PO POWD: Freq: Every day | ORAL | Status: DC

## 2016-10-16 SURGICAL SUPPLY — 91 items
ANCHOR TIS RET SYS 235ML (MISCELLANEOUS) ×4 IMPLANT
BAG URO DRAIN 2000ML W/SPOUT (MISCELLANEOUS) ×4 IMPLANT
BASIN GRAD PLASTIC 32OZ STRL (MISCELLANEOUS) ×4 IMPLANT
BLADE SURG 11 STRL SS SAFETY (MISCELLANEOUS) ×4 IMPLANT
CANISTER SUCT 1200ML W/VALVE (MISCELLANEOUS) ×4 IMPLANT
CANNULA DILATOR 10 W/SLV (CANNULA) ×6 IMPLANT
CANNULA DILATOR 10MM W/SLV (CANNULA) ×2
CATH FOLEY 2WAY  5CC 16FR (CATHETERS) ×2
CATH TRAY 16F METER LATEX (MISCELLANEOUS) IMPLANT
CATH URTH 16FR FL 2W BLN LF (CATHETERS) ×2 IMPLANT
CHLORAPREP W/TINT 26ML (MISCELLANEOUS) ×4 IMPLANT
CNTNR SPEC 2.5X3XGRAD LEK (MISCELLANEOUS) ×2
CONT SPEC 4OZ STER OR WHT (MISCELLANEOUS) ×2
CONTAINER SPEC 2.5X3XGRAD LEK (MISCELLANEOUS) ×2 IMPLANT
CORD BIP STRL DISP 12FT (MISCELLANEOUS) ×4 IMPLANT
CORD MONOPOLAR M/FML 12FT (MISCELLANEOUS) ×4 IMPLANT
COVER TIP SHEARS 8 DVNC (MISCELLANEOUS) ×2 IMPLANT
COVER TIP SHEARS 8MM DA VINCI (MISCELLANEOUS) ×2
DEFOGGER SCOPE WARMER CLEARIFY (MISCELLANEOUS) ×4 IMPLANT
DRAPE LAPAROTOMY 100X77 ABD (DRAPES) ×4 IMPLANT
DRAPE LEGGINS SURG 28X43 STRL (DRAPES) ×4 IMPLANT
DRAPE SHEET LG 3/4 BI-LAMINATE (DRAPES) ×8 IMPLANT
DRAPE UNDER BUTTOCK W/FLU (DRAPES) ×4 IMPLANT
DRESSING SURGICEL FIBRLLR 1X2 (HEMOSTASIS) ×6 IMPLANT
DRSG SURGICEL FIBRILLAR 1X2 (HEMOSTASIS) ×12
DRSG TELFA 3X8 NADH (GAUZE/BANDAGES/DRESSINGS) IMPLANT
ELECT BLADE 6 FLAT ULTRCLN (ELECTRODE) IMPLANT
ELECT CAUTERY BLADE 6.4 (BLADE) IMPLANT
ELECT REM PT RETURN 9FT ADLT (ELECTROSURGICAL) ×4
ELECTRODE REM PT RTRN 9FT ADLT (ELECTROSURGICAL) ×2 IMPLANT
FILTER LAP SMOKE EVAC STRL (MISCELLANEOUS) ×4 IMPLANT
GAUZE SPONGE 4X4 12PLY STRL (GAUZE/BANDAGES/DRESSINGS) IMPLANT
GLOVE BIO SURGEON STRL SZ 6.5 (GLOVE) ×24 IMPLANT
GLOVE BIO SURGEONS STRL SZ 6.5 (GLOVE) ×8
GLOVE INDICATOR 7.0 STRL GRN (GLOVE) ×32 IMPLANT
GOWN STRL REUS W/ TWL LRG LVL3 (GOWN DISPOSABLE) ×8 IMPLANT
GOWN STRL REUS W/TWL LRG LVL3 (GOWN DISPOSABLE) ×8
GRASPER SUT TROCAR 14GX15 (MISCELLANEOUS) ×4 IMPLANT
IRRIGATION STRYKERFLOW (MISCELLANEOUS) ×2 IMPLANT
IRRIGATOR STRYKERFLOW (MISCELLANEOUS) ×4
KIT ACCESSORY DA VINCI DISP (KITS) ×2
KIT ACCESSORY DVNC DISP (KITS) ×2 IMPLANT
KIT PINK PAD W/HEAD ARE REST (MISCELLANEOUS) ×4
KIT PINK PAD W/HEAD ARM REST (MISCELLANEOUS) ×2 IMPLANT
KIT RM TURNOVER CYSTO AR (KITS) ×4 IMPLANT
LABEL OR SOLS (LABEL) ×4 IMPLANT
LIQUID BAND (GAUZE/BANDAGES/DRESSINGS) ×4 IMPLANT
MANIPULATOR VCARE LG CRV RETR (MISCELLANEOUS) IMPLANT
MANIPULATOR VCARE STD CRV RETR (MISCELLANEOUS) IMPLANT
NDL INSUFF 14G 150MM VS150000 (NEEDLE) ×8 IMPLANT
NDL INSUFF ACCESS 14 VERSASTEP (NEEDLE) ×4 IMPLANT
NDL SAFETY 22GX1.5 (NEEDLE) ×8 IMPLANT
NS IRRIG 1000ML POUR BTL (IV SOLUTION) ×4 IMPLANT
NS IRRIG 500ML POUR BTL (IV SOLUTION) ×4 IMPLANT
OCCLUDER COLPOPNEUMO (BALLOONS) ×4 IMPLANT
PACK BASIN MAJOR ARMC (MISCELLANEOUS) IMPLANT
PACK GYN LAPAROSCOPIC (MISCELLANEOUS) ×4 IMPLANT
PAD OB MATERNITY 4.3X12.25 (PERSONAL CARE ITEMS) ×4 IMPLANT
PAD PREP 24X41 OB/GYN DISP (PERSONAL CARE ITEMS) ×4 IMPLANT
PENCIL ELECTRO HAND CTR (MISCELLANEOUS) ×4 IMPLANT
RETRACTOR GRASP SM DA VINCI (INSTRUMENTS)
RETRACTOR GRASP SM DVNC (INSTRUMENTS) IMPLANT
SCISSORS METZENBAUM CVD 33 (INSTRUMENTS) ×4 IMPLANT
SLEEVE VERSASTEP EXPAND ONEST (MISCELLANEOUS) ×20 IMPLANT
SOLUTION ELECTROLUBE (MISCELLANEOUS) ×4 IMPLANT
SPONGE LAP 18X18 5 PK (GAUZE/BANDAGES/DRESSINGS) IMPLANT
STAPLER SKIN PROX 35W (STAPLE) IMPLANT
SUT DVC VLOC 180 0 12IN GS21 (SUTURE)
SUT MAXON ABS #0 GS21 30IN (SUTURE) IMPLANT
SUT PDS AB 1 TP1 96 (SUTURE) IMPLANT
SUT VIC AB 0 CT1 27 (SUTURE) ×4
SUT VIC AB 0 CT1 27XCR 8 STRN (SUTURE) ×4 IMPLANT
SUT VIC AB 0 CT1 36 (SUTURE) ×4 IMPLANT
SUT VIC AB 1 CT1 36 (SUTURE) ×8 IMPLANT
SUT VIC AB 2-0 CT1 27 (SUTURE)
SUT VIC AB 2-0 CT1 TAPERPNT 27 (SUTURE) IMPLANT
SUT VICRYL 0 AB UR-6 (SUTURE) ×8 IMPLANT
SUT VICRYL AB 3-0 FS1 BRD 27IN (SUTURE) ×4 IMPLANT
SUTURE DVC VLC 180 0 12IN GS21 (SUTURE) IMPLANT
SUTURE MNCRYL 4-0 (SUTURE) ×4 IMPLANT
SYR 3ML LL SCALE MARK (SYRINGE) ×4 IMPLANT
SYR 50ML LL SCALE MARK (SYRINGE) ×4 IMPLANT
SYR BULB IRRIG 60ML STRL (SYRINGE) IMPLANT
SYR CONTROL 10ML (SYRINGE) ×4 IMPLANT
SYRINGE 10CC LL (SYRINGE) ×8 IMPLANT
TROCAR 130MM GELPORT  DAV (MISCELLANEOUS) ×4 IMPLANT
TROCAR DISP BLADELESS 8 DVNC (TROCAR) ×2 IMPLANT
TROCAR DISP BLADELESS 8MM (TROCAR) ×2
TROCAR VERSASTEP 12M LG PL (TROCAR) ×4 IMPLANT
TROCAR VERSASTEP PLUS 12MM (TROCAR) ×4 IMPLANT
TUBING INSUFFLATOR HEATED (MISCELLANEOUS) ×4 IMPLANT

## 2016-10-16 NOTE — Progress Notes (Signed)
  Oncology Nurse Navigator Documentation Referral placed to Dr. Bary Castilla per Dr. Theora Gianotti for symptomatic gallstones     )

## 2016-10-16 NOTE — Op Note (Signed)
Operative Note   10/16/2016 11:40 AM  PRE-OP DIAGNOSIS: endometrial cancer, grade 1 endometrioid   POST-OP DIAGNOSIS:  endometrial cancer, grade 1 endometrioid  SURGEON: Surgeon(s) and Role:    * Benjaman Kindler, MD - Primary       ASSISTANT: Sanaai Doane Gaetana Michaelis, MD  ANESTHESIA: Choice   PROCEDURE: Procedure(s): ROBOTIC ASSISTED TOTAL HYSTERECTOMY WITH BILATERAL SALPINGO OOPHORECTOMY SENTINEL NODE INJECTION PELVIC LYMPH NODE DISSECTION   ESTIMATED BLOOD LOSS: less than 100 mL  DRAINS: None  TOTAL IV FLUIDS: as per anesthesia  SPECIMENS:  Uterus, cervix, bilateral tubes and ovaries, bilateral SLN (right and left proximal obturator nodes), washings  COMPLICATIONS: None  DISPOSITION: PACU - hemodynamically stable.  CONDITION: stable  INDICATIONS: endometrial cancer, grade 1 endometrioid  FINDINGS: EUA reveals small uterus and no masses or nodularity. Uterus sounded to 6 cm. Intraoperative findings revealed small uterus, normal bilateral tubes and ovaries, normal upper abdomen/omentum/bowel/diaphragm. Gallbladder mildly distended but does not appear inflamed. Possible fatty liver.  Bilateral SLN (right and left proximal obturator nodes) identified. No enlarged pelvic or para-aortic nodes.   PROCEDURE IN DETAIL: After informed consent was obtained, the patient was taken to the operating room where anesthesia was obtained without difficulty. The patient was positioned in the dorsal lithotomy position in [place] and her arms were carefully tucked at her sides and the usual precautions were taken.  She was prepped and draped in normal sterile fashion.  The patient was placed in Trendelenburg to test her pulmonary function. Her pulmonary function was compromised but she was able to tolerate this position. Time-out was performed and a Foley catheter was placed into the bladder and the cervix was infiltrated with 4 ml of ICG at 3 an 9 o'clock both superficial and deep  injections. A standard VCare uterine manipulator was then placed in the uterus without incident.   An open Hasson technique was used to place an supraumbilical [id number] baloon trocar under direct visualization. The laparoscope was introduced and CO2 gas was infused slowly infused for pneumoperitoneum to a pressure of 12 mm Hg.  Right and left lateral 8-mm ports and a 5-12 mm LUQ port were placed under direct visualization of the laparoscope using an EndoStep technique.  Cytologic washings were obtained.  The patient was placed in Trendelenburg and the bowel was displaced up into the upper abdomen. This was limited due to adhesions in the right mid quadrant involving the ascending colon.  Round ligaments were divided on each side with the EndoShears and the retroperitoneal space was opened bilaterally.  The ureters were identified and preserved.  At this point the retroperitoneal spaces were developed and the lymphatic channels were mapped to each side.  The sentinel node on the right side was then identified, skeletonized and removed taking care not to injure the  obturator nerve, the ureter or the pelvic vasculature.  Similarly on the left side, the retroperitoneal spaces were developed, the lymphatic channels mapped to identify the sentinel node(s) and they were similarly removed with care to preserve the obturator nerve, the ureter and the pelvic vasculature. With hemostasis secured, the infundibulopelvic ligaments were skeletonized, sealed and divided with the LigaSure device.  A bladder flap was created. The bladder was adherent to the lower uterine segment and dissection proceeded with care until the bladder was dissected down off the lower uterine segment and cervix using endoshears and electrocautery.  The uterine arteries were skeletonized bilaterally, sealed and divided with the LigaSure device.  A colpotomy was performed circumferentially along the  V-Care ring with electrocautery and the cervix was incised  from the vagina and the specimen was removed through the vagina.  A pneumo balloon was placed in the vagina and the vaginal cuff was then closed in a running continuous fashion using the EndoStitch technique with 0 V-Lock suture with careful attention to include the vaginal cuff angles and the vaginal mucosa within the closure.  Intraoperative pathologic evaluation revealed favorable risk criteria and therefore further dissection was not performed. Hemostasis was observed. The intraperitoneal pressure was dropped, and all planes of dissection, vascular pedicles and the vaginal cuff were found to be hemostatic. The bladder was back filled to ensure the bladder was intact. The LUQ trocar was removed and the fascia was closed with 0 Vicryl suture using the Endoclose technique. The lateral trocars were removed under visualization.   Before the supraumbilical trocar was removed the CO2 gas was released.  The fascia there was closed with 0 Vicryl suture in interrupted technique.   The skin incision at the umbilicus was closed with subcuticular stitch.  The remaining skin incisions were closed with glue.  The patient tolerated the procedure well.  Sponge, lap and needle counts were correct x2.  The patient was taken to recovery room in excellent condition.  Antibiotics: Given 1st or 2nd generation cephalosporin, given prior to the start of the procedure for reasons not described in this operative report, and discontinued appropriately.   VTE prophylaxis: was ordered perioperatively.    Gillis Ends, MD

## 2016-10-16 NOTE — H&P (View-Only) (Signed)
  Oncology Nurse Navigator Documentation Chaperoned pelvic exam. Will arrange surgery with patient when date/assistant surgeon available.  Navigator Location: [facility] (09/18/16 1000) Navigator Encounter Type: Clinic/[facility]C (09/18/16 1000)                                Acuity: Level 3 (09/18/16 1000)     Acuity Level 3: Coordination of multimodality treatment;Emotional needs;Ongoing guidance and education provided throughout treatment (09/18/16 1000)   Time Spent with Patient: 45 (09/18/16 1000)

## 2016-10-16 NOTE — H&P (View-Only) (Signed)
Gynecologic Oncology Consult Visit   Referring Provider: Boykin Nearing, MD [address] [facility] [place], [place] [address] [phone]   Chief Concern: endometrial cancer  Subjective:  Jessica Dalton is a 65 y.o. female who is seen in consultation from Dr. Ouida Sills for grade 1 endometrial cancer.  She was referred to Dr. Ouida Sills by Dr. Netty Starring for PMB.  grade 1 endometrial cancer. EMBx and Pap performed on 09/09/2016. Uterus sounded to 10 cm. 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 Pap NILM.   Dr. Ouida Sills also ordered a CT scan to evaluate upper abdominal pain symptoms.     Problem List: Patient Active Problem List   Diagnosis Date Noted  . Endometrial cancer ([place]) 09/18/2016    Past Medical History: Past Medical History:  Diagnosis Date  . Asthma   . Obesity   . Tobacco use     Past Surgical History: Past Surgical History:  Procedure Laterality Date  . TUBAL LIGATION      Past Gynecologic History:  Menarche: unknown Menstrual details: postmenopausal Last Menstrual Period: 15 years ago History of OCP/HRT use: None History of Abnormal pap: no, benign cellular changes Last pap: as per HPI   OB History:  OB History  Gravida Para Term Preterm AB Living  3 3 3     3   SAB TAB Ectopic Multiple Live Births               # Outcome Date GA Lbr Len/2nd Weight Sex Delivery Anes PTL Lv  3 Term           2 Term           1 Term               Family History: Family History  Problem Relation Age of Onset  . Cancer Mother     renal cell  . Cancer Father     colon cancer  . Cancer Sister     melanoma    Social History: Social History   Social History  . Marital status: Married    Spouse name: N/A  . Number of children: N/A  . Years of education: N/A   Occupational History  . Not on file.   Social History Main Topics  . Smoking status: Current Every Day Smoker    Packs/day: 1.00   . Smokeless tobacco: Never Used     Comment: 75 pk yr - 1 ppd started age 44  . Alcohol use No  . Drug use: No  . Sexual activity: Not on file   Other Topics Concern  . Not on file   Social History Narrative  . No narrative on file    Allergies: No Known Allergies  Current Medications: No current outpatient prescriptions on file.   No current facility-administered medications for this visit.     Review of Systems General: night sweats  HEENT: no complaints  Lungs: cough, wheezing; no h/o severe asthma attacks  Cardiac: no complaints; specifically no chest pain or discomfort with activity.   GI: diarrhea/constipation/persistent diffuse abdominal pain  GU: PMB; urinary frequency  Musculoskeletal: no complaints  Extremities: no complaints  Skin: no complaints  Neuro: no complaints  Endocrine: no complaints  Psych: no complaints       Objective:  Physical Examination:  BP 137/82 (BP Location: Left Arm, Patient Position: Sitting)   Pulse 93   Temp 97.6 F (36.4 C) (Tympanic)   Ht 5' (1.524 m)  Wt 226 lb 6.6 oz (102.7 kg)   BMI 44.22 kg/m    ECOG Performance Status: 0 - Asymptomatic  General appearance: alert, cooperative and appears stated age HEENT:PERRLA, extra ocular movement intact and sclera clear, anicteric Lymph node survey: non-palpable, axillary, inguinal, supraclavicular Cardiovascular: regular rate and rhythm Respiratory: normal air entry, lungs clear to auscultation but decreased air movement in bilateral posterior lower fields. Abdomen: soft, non-tender, without masses or organomegaly and no hernias. Obese.  Back: inspection of back is normal Extremities: extremities normal, atraumatic, no cyanosis or edema Neurological exam reveals alert, oriented, normal speech, no focal findings or movement disorder noted.  Pelvic: exam chaperoned by nurse;  Vulva: normal appearing vulva with no masses, tenderness or lesions; Vagina: normal vagina; Adnexa:  limited by exam but no obvious masses; Uterus:  limited by exam, unable to determine normal size, shape, consistency, but uterus seems mobile and nontender; Cervix: no lesions; Rectal: not indicated   Lab Review Labs on site today: Pending  Radiologic Imaging: CXR pending    Assessment:  Jessica Dalton is a 65 y.o. female diagnosed with grade 1 endometrioid endometrial cancer. Medical co-morbidities complicating care: asthma, tobacco use, and obesity.  Plan:   Problem List Items Addressed This Visit      Genitourinary   Endometrial cancer ([place]) - Primary    Other Visit Diagnoses   None.     We discussed options for management including hormonal therapy, radiation, and surgery. Based on her history and optimal outcomes , we recommend definitive surgical evaluationwith either laparoscopic or robotic TLH, BSO, SLN injection, mapping, and biopsy. We will order preop labs including HbA1c, CXR, and EKG. We will defer to Preop screening regarding the need for PFTs. She has not had PFTs per her knowledge and she is very inactive so difficult to determine her pulmonary function.   I recommended that she stop smoking in preparation for surgery.   Risks were discussed in detail. These include infection, anesthesia, bleeding, transfusion, wound separation, medical issues (blood clots, stroke, heart attack, fluid in the lungs, pneumonia, abnormal heart rhythm, death), possible exploratory surgery with larger incision, lymphedema, lymphocyst, allergic reaction.   Suggested return to clinic in  4 - 6 weeks for postoperative evaluation.    The patient's diagnosis, an outline of the further diagnostic and laboratory studies which will be required, the recommendation, and alternatives were discussed.  All questions were answered to the patient's satisfaction.  A total of 90 minutes were spent in education, counseling and coordination of care for endometrial cancer.    Gillis Ends,  MD    CC:  Boykin Nearing, MD [address] [facility] [place], [place] [address] [phone]  Dr. Dion Body

## 2016-10-16 NOTE — Anesthesia Preprocedure Evaluation (Addendum)
Anesthesia Evaluation  Patient identified by MRN, date of birth, ID band Patient awake    Reviewed: Allergy & Precautions, NPO status , Patient's Chart, lab work & pertinent test results  Airway Mallampati: III       Dental  (+) Chipped, Missing   Pulmonary asthma , Current Smoker,     + wheezing      Cardiovascular negative cardio ROS Normal cardiovascular exam     Neuro/Psych negative neurological ROS  negative psych ROS   GI/Hepatic negative GI ROS, Neg liver ROS,   Endo/Other  negative endocrine ROS  Renal/GU negative Renal ROS  negative genitourinary   Musculoskeletal negative musculoskeletal ROS (+)   Abdominal (+) + obese,   Peds negative pediatric ROS (+)  Hematology  (+) anemia ,   Anesthesia Other Findings Past Medical History: No date: Anemia No date: Asthma No date: Cancer ([place])     Comment: uterine ca No date: Obesity No date: Tobacco use  Reproductive/Obstetrics                             Anesthesia Physical Anesthesia Plan  ASA: II  Anesthesia Plan: General   Post-op Pain Management:    Induction: Intravenous  Airway Management Planned: Oral ETT  Additional Equipment:   Intra-op Plan:   Post-operative Plan: Extubation in OR  Informed Consent: I have reviewed the patients History and Physical, chart, labs and discussed the procedure including the risks, benefits and alternatives for the proposed anesthesia with the patient or authorized representative who has indicated his/her understanding and acceptance.   Dental advisory given  Plan Discussed with: CRNA and Surgeon  Anesthesia Plan Comments:         Anesthesia Quick Evaluation

## 2016-10-16 NOTE — Interval H&P Note (Signed)
History and Physical Interval Note:  10/16/2016 7:25 AM  Jessica Dalton  has presented today for surgery, with the diagnosis of endometrial cancer  The various methods of treatment have been discussed with the patient and family. After consideration of risks, benefits and other options for treatment, the patient has consented to  Procedure(s): ROBOTIC ASSISTED TOTAL HYSTERECTOMY WITH BILATERAL SALPINGO OOPHORECTOMY (Bilateral) SENTINEL NODE INJECTION (N/A) PELVIC LYMPH NODE DISSECTION (N/A) as a surgical intervention .    EKG - WNL. CXR - no mets.  CT scan - cholelithiasis - today she reports she is symptomatic. I checked with the nursing team and unlikely to have [place] available for surgery. She also has borderline enlarged porta hepatis node; indeterminant adrenal nodule. We reviewed these findings. I will page Dr. Fleet Contras regarding management.   The patient's history has been reviewed, patient examined, no change in status, stable for surgery.  I have reviewed the patient's chart and labs.  Questions were answered to the patient's satisfaction.     Jessica Dalton ALVAREZ

## 2016-10-16 NOTE — Progress Notes (Signed)
Report given to Maddie, RN on 2A- Patient transported on bed with Monitor and Oxygen and belongings sent including flowers and clothing. Inpatient meds taken to unit; patient taken to room 239- Care transferred.

## 2016-10-16 NOTE — Anesthesia Postprocedure Evaluation (Signed)
Anesthesia Post Note  Patient: Jessica Dalton  Procedure(s) Performed: Procedure(s) (LRB): ROBOTIC ASSISTED TOTAL HYSTERECTOMY WITH BILATERAL SALPINGO OOPHORECTOMY (Bilateral) SENTINEL NODE INJECTION (N/A) PELVIC LYMPH NODE DISSECTION (N/A)  Patient location during evaluation: PACU Anesthesia Type: General Level of consciousness: awake and alert and oriented Pain management: pain level controlled Vital Signs Assessment: post-procedure vital signs reviewed and stable Respiratory status: spontaneous breathing Cardiovascular status: blood pressure returned to baseline Anesthetic complications: no    Last Vitals:  Vitals:   10/16/16 1400 10/16/16 1500  BP: (!) 158/102 (!) 152/67  Pulse: (!) 110 (!) 104  Resp: 16 18  Temp: 36.6 C 36.6 C    Last Pain:  Vitals:   10/16/16 1500  TempSrc: Oral  PainSc:                  Connor Foxworthy

## 2016-10-16 NOTE — Interval H&P Note (Signed)
Discussed with Dr. Bary Castilla Plan for referral postop to him for further evaluation.  Gillis Ends, MD

## 2016-10-16 NOTE — Anesthesia Procedure Notes (Signed)
Procedure Name: Intubation Date/Time: 10/16/2016 7:55 AM Performed by: [id number], Avni Traore Pre-anesthesia Checklist: Patient identified, Emergency Drugs available, Suction available, Timeout performed and Patient being monitored Patient Re-evaluated:Patient Re-evaluated prior to inductionOxygen Delivery Method: Circle system utilized Preoxygenation: Pre-oxygenation with 100% oxygen Intubation Type: IV induction, Rapid sequence and Cricoid Pressure applied Laryngoscope Size: Mac and 3 Grade View: Grade II Tube type: Oral Number of attempts: 1 Airway Equipment and Method: Stylet Placement Confirmation: ETT inserted through vocal cords under direct vision,  positive ETCO2,  CO2 detector and breath sounds checked- equal and bilateral Secured at: 21 cm Tube secured with: Tape

## 2016-10-16 NOTE — Transfer of Care (Signed)
Immediate Anesthesia Transfer of Care Note  Patient: Jessica Dalton  Procedure(s) Performed: Procedure(s): ROBOTIC ASSISTED TOTAL HYSTERECTOMY WITH BILATERAL SALPINGO OOPHORECTOMY (Bilateral) SENTINEL NODE INJECTION (N/A) PELVIC LYMPH NODE DISSECTION (N/A)  Patient Location: PACU  Anesthesia Type:General  Level of Consciousness: awake, alert  and oriented  Airway & Oxygen Therapy: Patient Spontanous Breathing  Post-op Assessment: Report given to RN  Post vital signs: Reviewed and stable  Last Vitals:  Vitals:   10/16/16 1130 10/16/16 1132  BP: (!) 162/63 (!) 162/63  Pulse: 82 80  Resp: 16 17  Temp: (!) 36 C 37 C    Last Pain:  Vitals:   10/16/16 1130  TempSrc:   PainSc: (P) Asleep         Complications: No apparent anesthesia complications

## 2016-10-16 NOTE — Consult Note (Signed)
Name: Jessica Dalton MRN: [id number] DOB: [birth year]    ADMISSION DATE:  10/16/2016 CONSULTATION DATE:  10/16/2016  REFERRING MD :  Dr. Leafy Ro  CHIEF COMPLAINT:  Oxygen Desaturation  BRIEF PATIENT DESCRIPTION: This is a 65 yo female presented to [facility] on 11/8 for an elective robotic assisted total hysterectomy with bilateral salpingo oophorectomy and pelvic lymph node dissection due to recent diagnosis of grade 1 endometrial cancer. On 11/8 developed acute hypoxic respiratory failure s/p total hysterectomy.   SIGNIFICANT EVENTS  11/8-Pt had an elective robotic assisted total hysterectomy due to recent diagnosis of grade 1 endometrial cancer 11/8-PCCM consulted for acute hypoxic respiratory failure s/p total hysterectomy and pain management 11/8-Pt transferred to the telemetry unit   STUDIES:  None  HISTORY OF PRESENT ILLNESS:   This is a 65 yo female with a PMH of Differentiated Endometrial Carcinoma (diagnosed 09/16/16), Asthma without status asthmaticus, Morbid obesity, Current everyday smoker (1 to 1.5 PPD for 50 yrs), Gallstones, and Anemia.  She presented to [place] [facility] on 11/8 for an elective robotic assisted total hysterectomy with bilateral salpingo oophorectomy and pelvic lymph node dissection due to recent diagnosis of grade 1 endometrial cancer.  S/P surgery the pt began to desat with an O2 sat of 88% on 3L O2 via nasal canula, therefore she was placed on Bipap for 30 minutes, given duonebs x2 doses, and 125 mg iv solumedrol x1 dose in the PACU symptoms improved. She was subsequently transferred to the OBGYN floor where she continued to desat on nasal canula O2 sats 88% and developed new onset atrial fibrillation requiring transfer to the telemetry unit.  She does endorse wheezing, however states she always wheezes at home she has never had PFT's.  PCCM consulted 11/8 for acute hypoxic respiratory failure s/p total hysterectomy and pain management.   PAST MEDICAL HISTORY :   has a past  medical history of Anemia; Asthma; Cancer ([place]); Gallstones (10/16/2016); Obesity; and Tobacco use.  has a past surgical history that includes Tubal ligation and Cataract extraction, bilateral (2015). Prior to Admission medications   Medication Sig Start Date End Date Taking? Authorizing Provider  acetaminophen (TYLENOL) 500 MG tablet Take 1,500 mg by mouth daily as needed for mild pain.   Yes Historical Provider, MD  ibuprofen (ADVIL,MOTRIN) 200 MG tablet Take 400 mg by mouth every 6 (six) hours as needed for headache or moderate pain.   Yes Historical Provider, MD  polyethylene glycol (MIRALAX / GLYCOLAX) packet Take 17 g by mouth daily as needed for mild constipation or moderate constipation.   Yes Historical Provider, MD  Wheat Dextrin (BENEFIBER PO) Take 1 Dose by mouth daily.   Yes Historical Provider, MD   No Known Allergies  FAMILY HISTORY:  family history includes Cancer in her father, mother, and sister. SOCIAL HISTORY:  reports that she has been smoking.  She has been smoking about 1.00 pack per day. She has never used smokeless tobacco. She reports that she does not drink alcohol or use drugs.  REVIEW OF SYSTEMS:  Positives in BOLD Constitutional: Negative for fever, chills, weight loss, malaise/fatigue and diaphoresis.  HENT: Negative for hearing loss, ear pain, nosebleeds, congestion, sore throat, neck pain, tinnitus and ear discharge.   Eyes: Negative for blurred vision, double vision, photophobia, pain, discharge and redness.  Respiratory: cough, hemoptysis, sputum production, shortness of breath, wheezing and stridor.   Cardiovascular: Negative for chest pain, palpitations, orthopnea, claudication, leg swelling and PND.  Gastrointestinal: heartburn, nausea, vomiting, abdominal pain, diarrhea, constipation, blood  in stool and melena.  Genitourinary: Negative for dysuria, urgency, frequency, hematuria and flank pain.  Musculoskeletal: Negative for myalgias, back pain, joint pain  and falls.  Skin: Negative for itching and rash.  Neurological: Negative for dizziness, tingling, tremors, sensory change, speech change, focal weakness, seizures, loss of consciousness, weakness and headaches.  Endo/Heme/Allergies: Negative for environmental allergies and polydipsia. Does not bruise/bleed easily.  SUBJECTIVE:  Pt states she is frustrated she wants to get up and move around she is tired of laying in her bed.  She says she is having difficulty coughing due to abdominal pain while coughing.  She states she is currently no short of breath.  VITAL SIGNS: Temp:  [96.8 F (36 C)-98.6 F (37 C)] 98.3 F (36.8 C) (11/08 1626) Pulse Rate:  [77-110] 88 (11/08 1626) Resp:  [13-22] 22 (11/08 1626) BP: (141-170)/(59-102) 146/74 (11/08 1626) SpO2:  [89 %-95 %] 95 % (11/08 1626) Weight:  [237 lb (107.5 kg)] 237 lb (107.5 kg) (11/08 [id number])  PHYSICAL EXAMINATION: General:  Obese Caucasian female, well nourished  Neuro:  Alert and oriented, follows commands, PERRLA HEENT:  Supple, no JVD Cardiovascular:  Irregular, irregular, no M/R/G Lungs:  Expiratory wheezes throughout, even, non labored on nasal canula no respiratory distress Abdomen:  Hypoactive BS x4, soft, obese, tender, non distended Musculoskeletal:  Normal bulk and tone Skin: 4 laparoscopic abdominal incision sites open to air, clean, well approximated, no rashes present   Recent Labs Lab 10/10/16 1052  NA 139  K 4.1  CL 102  CO2 31  BUN 11  CREATININE 0.55  GLUCOSE 92    Recent Labs Lab 10/10/16 1052  HGB 18.2*  HCT 53.4*  WBC 9.1  PLT 192   No results found.  ASSESSMENT / PLAN: Acute hypoxic respiratory failure s/p total hysterectomy Likely has undiagnosed COPD vs. Chronic bronchitis due to extensive smoking history Hx: Current everyday smoker (1 to 1.5 PPD for 50 yrs) Acute pain s/p total hysterectomy New onset atrial fibrillation s/p total hysterectomy-currently rate controlled P: Bipap as needed  during sleep Supplemental O2 to maintain O2 sats 88% or above Trend troponin's Cardiology consulted appreciate input IV Metoprolol for heart rate greater than 120 Scheduled and prn bronchodilators IV solumedrol wean as tolerated Prn iv morphine for pain management Instructed pt to cough and deep breath Incentive spirometry and flutter valve CXR today-11/8 Smoking Cessation counseling  Will need outpatient PFT's once discharged    Marda Stalker, [place] Pager [phone] (please enter 7 digits) PCCM Consult Pager [phone] (please enter 7 digits)  STAFF NOTE: I, Dr. Vilinda Boehringer have personally reviewed patient's available data, including medical history, events of note, physical examination and test results as part of my evaluation. I have discussed with NP Blakeney and other care providers such as pharmacist, RN and RRT.  In addition,  I personally evaluated patient and elicited key findings of   HPI:  65 year old female past medical history of endometrial carcinoma now status post elective robotic-assisted total hysterectomy with bilateral salpingo-oophorectomy and lymph node dissection, back obese, morbid obesity, seen in consultation for post operative hypoxia. Shortly after the patient's procedure, she was noted to be descending down to 85% requiring 2-4 L of supplemental oxygen to keep her sats above 88%. Pulmonary was consulted for further management and care. Patient has a significant smoking history of about a pack per days she was about 65 years old. She denies any chronic shortness of breath or chronic cough. Off note, patient noted  to have new onset atrial fibrillation with heart rate in the 120s initially now rate controlled. She also endorses intermittent wheezing at times at home  O:  GEN-no acute distress, sitting in chair,  HEENT-PERRLA, [place]/AT, no lesions CVS-s1,s2, RRR, no murmurs LUNGS-fair air way entry, diffuse expiratory wheezes  (mild) ABD-+bs, nt, nd, soft MSK-no lesions   Recent Labs CBC Latest Ref Rng & Units 10/17/2016 10/16/2016 10/10/2016  WBC 3.6 - 11.0 K/uL 18.4(H) 17.8(H) 9.1  Hemoglobin 12.0 - 16.0 g/dL 16.1(H) 17.7(H) 18.2(H)  Hematocrit 35.0 - 47.0 % 48.1(H) 52.7(H) 53.4(H)  Platelets 150 - 440 K/uL 150 169 192      Recent Labs BMP Latest Ref Rng & Units 10/17/2016 10/16/2016 10/10/2016  Glucose 65 - 99 mg/dL 142(H) 165(H) 92  BUN 6 - 20 mg/dL 7 9 11   Creatinine 0.44 - 1.00 mg/dL 0.41(L) 0.57 0.55  Sodium 135 - 145 mmol/L 139 139 139  Potassium 3.5 - 5.1 mmol/L 3.9 4.0 4.1  Chloride 101 - 111 mmol/L 102 104 102  CO2 22 - 32 mmol/L 31 26 31   Calcium 8.9 - 10.3 mg/dL 8.6(L) 8.7(L) 9.0       (The following images and results were reviewed by Dr. Stevenson Clinch on 10/17/2016). Dg Chest 1 View  Result Date: 10/16/2016 CLINICAL DATA:  Pain and discomfort postop hysterectomy. EXAM: CHEST 1 VIEW COMPARISON:  10/10/2016 FINDINGS: The heart size and mediastinal contours are within normal limits. No pneumonic consolidation, CHF, effusion or pneumothorax. Mild diffuse interstitial prominence as before which may reflect bronchitic change. The visualized skeletal structures are unremarkable. IMPRESSION: No pneumonic consolidation. Mild interstitial prominence as before consistent with bronchitic change. Electronically Signed   By: Ashley Royalty M.D.   On: 10/16/2016 17:28      A: 65 year old female history of endometrial carcinoma now status post robotic hysterectomy and bilateral salpingo-oophorectomy with postoperative hypoxia  Postoperative hypoxia Acute respiratory failure-hypoxia History of asthma Tobacco abuse New onset atrial fibrillation-now rate controlled  P:   -Continue with bronchodilators -Pulmonary hygiene with incentive spirometry will be paramount to her overall improvement -Tobacco cessation counseling given to the patient -Out of bed to chair and movement will also be helpful in expanding her  lungs and diaphragm -Anticipate she has some postoperative atelectasis, again walking and use of incentive spirometry will be helpful with this -Heart rate regular this morning with rate control, negative troponins, urology consult pending -Anticipate patient may need short-term oxygen, hopefully we can wean her off in the next day or 2 -Patient has the option to follow-up with pulmonary upon discharge. -Patient chart has a diagnosis of asthma however given her prolonged smoking history and intermittent wheezing, I believe that she more likely has COPD versus asthma. Classification of her COPD (chronic bronchitis versus emphysema) will be based on a number of clinical factors and pulmonary function testing; both can be performed as an outpatient.   .  Rest per NP/medical resident whose note is outlined above and that I agree with  The patient is critically ill with multiple organ systems failure and requires high complexity decision making for assessment and support, frequent evaluation and titration of therapies, application of advanced monitoring technologies and extensive interpretation of multiple databases.   Critical Care Time devoted to patient care services described in this note is  45 Minutes.   This time reflects time of care of this signee Dr Vilinda Boehringer.  This critical care time does not reflect procedure time, or teaching time or supervisory time of  PA/NP/Med-student/Med Resident etc but could involve care discussion time.  Vilinda Boehringer, MD [facility] Pager [phone] (please enter 7-digits) On Call Pager [phone] (please enter 7-digits)  Note: This note was prepared with Dragon dictation along with smaller phrase technology. Any transcriptional errors that result from this process are unintentional.

## 2016-10-16 NOTE — Progress Notes (Signed)
PHARMACIST - PHYSICIAN ORDER COMMUNICATION  CONCERNING: P&T Medication Policy on Herbal Medications  DESCRIPTION:  This patient's order for:  Benefiber  has been noted.  This product(s) is classified as an "herbal" or natural product. Due to a lack of definitive safety studies or FDA approval, nonstandard manufacturing practices, plus the potential risk of unknown drug-drug interactions while on inpatient medications, the Pharmacy and Therapeutics Committee does not permit the use of "herbal" or natural products of this type within [facility]   ACTION TAKEN: The pharmacy department is unable to verify this order at this time and your patient has been informed of this safety policy. Please reevaluate patient's clinical condition at discharge and address if the herbal or natural product(s) should be resumed at that time.

## 2016-10-16 NOTE — OR Nursing (Signed)
Patient has required vigorous pulmonary intervention for decreased.  Duoneb x 2, bi- pap for 30 minutes and solumedrol.  Dr. Kayleen Memos in to check patient repeatedly.  Patient to be on pulse oximetry on the floor.

## 2016-10-16 NOTE — Progress Notes (Signed)
Pt. admitted to unit, [id number] from PACU/MB, report from [place], Therapist, sports. Oriented to room, call bell, Ascom phones and staff. Bed in low position. Fall safety plan reviewed, yellow non-skid socks in place, bed alarm on. Full assessment to Epic; skin assessed with Gildardo Pounds RN. Telemetry box verified with CCMD and Gildardo Pounds, RN: [phone]. Will continue to monitor.

## 2016-10-17 ENCOUNTER — Encounter: Payer: Self-pay | Admitting: Obstetrics and Gynecology

## 2016-10-17 DIAGNOSIS — J9601 Acute respiratory failure with hypoxia: Secondary | ICD-10-CM | POA: Diagnosis not present

## 2016-10-17 DIAGNOSIS — I4891 Unspecified atrial fibrillation: Secondary | ICD-10-CM | POA: Diagnosis not present

## 2016-10-17 DIAGNOSIS — R000 Tachycardia, unspecified: Secondary | ICD-10-CM

## 2016-10-17 DIAGNOSIS — C541 Malignant neoplasm of endometrium: Secondary | ICD-10-CM | POA: Diagnosis not present

## 2016-10-17 DIAGNOSIS — F17210 Nicotine dependence, cigarettes, uncomplicated: Secondary | ICD-10-CM | POA: Diagnosis not present

## 2016-10-17 DIAGNOSIS — R062 Wheezing: Secondary | ICD-10-CM

## 2016-10-17 LAB — CBC
HCT: 48.1 % — ABNORMAL HIGH (ref 35.0–47.0)
HEMOGLOBIN: 16.1 g/dL — AB (ref 12.0–16.0)
MCH: 31 pg (ref 26.0–34.0)
MCHC: 33.5 g/dL (ref 32.0–36.0)
MCV: 92.4 fL (ref 80.0–100.0)
PLATELETS: 150 10*3/uL (ref 150–440)
RBC: 5.21 MIL/uL — ABNORMAL HIGH (ref 3.80–5.20)
RDW: 13.3 % (ref 11.5–14.5)
WBC: 18.4 10*3/uL — ABNORMAL HIGH (ref 3.6–11.0)

## 2016-10-17 LAB — BASIC METABOLIC PANEL
Anion gap: 6 (ref 5–15)
BUN: 7 mg/dL (ref 6–20)
CHLORIDE: 102 mmol/L (ref 101–111)
CO2: 31 mmol/L (ref 22–32)
CREATININE: 0.41 mg/dL — AB (ref 0.44–1.00)
Calcium: 8.6 mg/dL — ABNORMAL LOW (ref 8.9–10.3)
GFR calc Af Amer: >60 mL/min (ref >60)
GFR calc non Af Amer: >60 mL/min (ref >60)
GLUCOSE: 142 mg/dL — AB (ref 65–99)
Potassium: 3.9 mmol/L (ref 3.5–5.1)
SODIUM: 139 mmol/L (ref 135–145)

## 2016-10-17 LAB — MAGNESIUM: MAGNESIUM: 1.8 mg/dL (ref 1.7–2.4)

## 2016-10-17 LAB — TROPONIN I

## 2016-10-17 MED ORDER — IPRATROPIUM-ALBUTEROL 0.5-2.5 (3) MG/3ML IN SOLN: 3.0000 mL | RESPIRATORY_TRACT | Status: DC | PRN

## 2016-10-17 MED ORDER — IPRATROPIUM-ALBUTEROL 0.5-2.5 (3) MG/3ML IN SOLN
3.0000 mL | Freq: Four times a day (QID) | RESPIRATORY_TRACT | Status: DC
  Administered 2016-10-17 – 2016-10-18 (×5): 3 mL via RESPIRATORY_TRACT
  Filled 2016-10-17 (×5): qty 3

## 2016-10-17 MED ORDER — PREDNISONE 20 MG PO TABS
20.0000 mg | ORAL_TABLET | Freq: Every day | ORAL | Status: DC
  Administered 2016-10-17 – 2016-10-18 (×2): 20 mg via ORAL
  Filled 2016-10-17 (×2): qty 1

## 2016-10-17 MED ORDER — MAGNESIUM SULFATE 2 GM/50ML IV SOLN
2.0000 g | Freq: Once | INTRAVENOUS | Status: AC
  Administered 2016-10-17: 2 g via INTRAVENOUS
  Filled 2016-10-17: qty 50

## 2016-10-17 NOTE — Care Management (Signed)
Patient placed in observation s/p lap total hysterectomy with oophorectomy.  She experienced low oxygenation in PACU.  Informed during progression that just before progression report,  patient desatted at rest on room air to 86% and required 4 liters of oxygen to bring sats up into the nineties.  There does not appears to be any chronic cardiopulmonary diagnosis.  Asked primary nurse to document this information and notified UR to watch for indications patient may meet inpatient criteria. Also had an eight beat run of V Tach on tele. No change in orders.

## 2016-10-17 NOTE — Progress Notes (Signed)
Obstetric and Gynecology  POD 1  Subjective  Patient doing well, no complaints, tolerating PO intake, tolerating pain with PO meds,  Patient sitting in chair having nebulizer treatment. Foley in place.     Denies CP, SOB, F/C, N/V/D, or leg pain.  Telemetry: NSR except for an 8 beat run of V-tach overnight.  Patient says she was startled, then this happened.   Objective  Objective:     Current Vital Signs 24h Vital Sign Ranges  T 99.5 F (37.5 C) Temp  Avg: 98.1 F (36.7 C)  Min: 96.8 F (36 C)  Max: 99.5 F (37.5 C)  BP (!) 131/48 BP  Min: 131/48  Max: 170/97  HR 96 Pulse  Avg: 96.5  Min: 77  Max: 127  RR 18 Resp  Avg: 17.8  Min: 13  Max: 22  SaO2 92 % Nasal Cannula SpO2  Avg: 92.7 %  Min: 89 %  Max: 96 %           24 Hour I/O Current Shift I/O  Time Ins Outs 11/08 0701 - 11/09 0700 In: 5010.4 [P.O.:100; I.V.:4160.4] Out: 2710 [Urine:2460] 11/09 0701 - 11/09 1900 In: -  Out: 600 [Urine:600]   General: NAD Cardiovascular: RRR, no murmurs Pulmonary: CTAB, normal respiratory effort Abdomen: Benign. Non-tender, +BS, no guarding. Incisions c/d/i Extremities: No erythema or cords, no calf tenderness, with normal peripheral pulses.  Labs: Results for orders placed or performed during the hospital encounter of 10/16/16 (from the past 24 hour(s))  Troponin I (q 6hr x 3)     Status: None   Collection Time: 10/16/16  4:58 PM  Result Value Ref Range   Troponin I <0.03 <0.03 ng/mL  CBC with Differential/Platelet     Status: Abnormal   Collection Time: 10/16/16  4:58 PM  Result Value Ref Range   WBC 17.8 (H) 3.6 - 11.0 K/uL   RBC 5.72 (H) 3.80 - 5.20 MIL/uL   Hemoglobin 17.7 (H) 12.0 - 16.0 g/dL   HCT 52.7 (H) 35.0 - 47.0 %   MCV 92.1 80.0 - 100.0 fL   MCH 31.0 26.0 - 34.0 pg   MCHC 33.6 32.0 - 36.0 g/dL   RDW 13.3 11.5 - 14.5 %   Platelets 169 150 - 440 K/uL   Neutrophils Relative % 96 %   Neutro Abs 17.1 (H) 1.4 - 6.5 K/uL   Lymphocytes Relative 3 %   Lymphs  Abs 0.5 (L) 1.0 - 3.6 K/uL   Monocytes Relative 1 %   Monocytes Absolute 0.2 0.2 - 0.9 K/uL   Eosinophils Relative 0 %   Eosinophils Absolute 0.0 0 - 0.7 K/uL   Basophils Relative 0 %   Basophils Absolute 0.0 0 - 0.1 K/uL  Basic metabolic panel     Status: Abnormal   Collection Time: 10/16/16  4:58 PM  Result Value Ref Range   Sodium 139 135 - 145 mmol/L   Potassium 4.0 3.5 - 5.1 mmol/L   Chloride 104 101 - 111 mmol/L   CO2 26 22 - 32 mmol/L   Glucose, Bld 165 (H) 65 - 99 mg/dL   BUN 9 6 - 20 mg/dL   Creatinine, Ser 0.57 0.44 - 1.00 mg/dL   Calcium 8.7 (L) 8.9 - 10.3 mg/dL   GFR calc non Af Amer >60 >60 mL/min   GFR calc Af Amer >60 >60 mL/min   Anion gap 9 5 - 15  Magnesium     Status: None   Collection Time: 10/16/16  4:58 PM  Result Value Ref Range   Magnesium 1.7 1.7 - 2.4 mg/dL  Troponin I (q 6hr x 3)     Status: None   Collection Time: 10/16/16 10:36 PM  Result Value Ref Range   Troponin I <0.03 <0.03 ng/mL  Troponin I (q 6hr x 3)     Status: None   Collection Time: 10/17/16  3:55 AM  Result Value Ref Range   Troponin I <0.03 <0.03 ng/mL  CBC     Status: Abnormal   Collection Time: 10/17/16  3:55 AM  Result Value Ref Range   WBC 18.4 (H) 3.6 - 11.0 K/uL   RBC 5.21 (H) 3.80 - 5.20 MIL/uL   Hemoglobin 16.1 (H) 12.0 - 16.0 g/dL   HCT 48.1 (H) 35.0 - 47.0 %   MCV 92.4 80.0 - 100.0 fL   MCH 31.0 26.0 - 34.0 pg   MCHC 33.5 32.0 - 36.0 g/dL   RDW 13.3 11.5 - 14.5 %   Platelets 150 150 - 440 K/uL  Basic metabolic panel     Status: Abnormal   Collection Time: 10/17/16  3:55 AM  Result Value Ref Range   Sodium 139 135 - 145 mmol/L   Potassium 3.9 3.5 - 5.1 mmol/L   Chloride 102 101 - 111 mmol/L   CO2 31 22 - 32 mmol/L   Glucose, Bld 142 (H) 65 - 99 mg/dL   BUN 7 6 - 20 mg/dL   Creatinine, Ser 0.41 (L) 0.44 - 1.00 mg/dL   Calcium 8.6 (L) 8.9 - 10.3 mg/dL   GFR calc non Af Amer >60 >60 mL/min   GFR calc Af Amer >60 >60 mL/min   Anion gap 6 5 - 15  Magnesium      Status: None   Collection Time: 10/17/16  3:55 AM  Result Value Ref Range   Magnesium 1.8 1.7 - 2.4 mg/dL    Cultures: No results found for this or any previous visit.  Imaging: Dg Chest 1 View  Result Date: 10/16/2016 CLINICAL DATA:  Pain and discomfort postop hysterectomy. EXAM: CHEST 1 VIEW COMPARISON:  10/10/2016 FINDINGS: The heart size and mediastinal contours are within normal limits. No pneumonic consolidation, CHF, effusion or pneumothorax. Mild diffuse interstitial prominence as before which may reflect bronchitic change. The visualized skeletal structures are unremarkable. IMPRESSION: No pneumonic consolidation. Mild interstitial prominence as before consistent with bronchitic change. Electronically Signed   By: Ashley Royalty M.D.   On: 10/16/2016 17:44      Assessment   [address] Hospital Day: 2 Postop day 1 RATLH BSO, Bilateral Pelvic Sentinel Lymphadenectomy  Plan   1. Pulm: chronic respiratory disease, continue nebulizers/O2 delivery PRN  2. CV: had a run of VTach per nursing x 8 beats overnight, however noted in computer as Afib.  Troponins negative x3.  Currently and normally NSR.  Continue telemetry until discharge.  Does not have Cardiologist; will likely need followup with one. 3. Post-op: remove foley catheter, encourage ambulation, advance diet as tolerated.  4. Dispo: pending cardiac and pulmonary status.  ----- Larey Days, MD Attending Obstetrician and Gynecologist [facility], Department of [facility]

## 2016-10-17 NOTE — Care Management Obs Status (Signed)
[place] NOTIFICATION   Patient Details  Name: Jessica Dalton MRN: [id number] Date of Birth: [birth year]   Medicare Observation Status Notification Given:  Yes    Beau Fanny, RN 10/17/2016, 8:33 AM

## 2016-10-18 DIAGNOSIS — J9601 Acute respiratory failure with hypoxia: Secondary | ICD-10-CM | POA: Diagnosis not present

## 2016-10-18 DIAGNOSIS — C541 Malignant neoplasm of endometrium: Secondary | ICD-10-CM | POA: Diagnosis not present

## 2016-10-18 DIAGNOSIS — I480 Paroxysmal atrial fibrillation: Secondary | ICD-10-CM

## 2016-10-18 DIAGNOSIS — F17210 Nicotine dependence, cigarettes, uncomplicated: Secondary | ICD-10-CM | POA: Diagnosis not present

## 2016-10-18 LAB — CBC
HCT: 43.7 % (ref 35.0–47.0)
HEMOGLOBIN: 15 g/dL (ref 12.0–16.0)
MCH: 31.4 pg (ref 26.0–34.0)
MCHC: 34.3 g/dL (ref 32.0–36.0)
MCV: 91.7 fL (ref 80.0–100.0)
PLATELETS: 134 10*3/uL — AB (ref 150–440)
RBC: 4.76 MIL/uL (ref 3.80–5.20)
RDW: 13.2 % (ref 11.5–14.5)
WBC: 12.7 10*3/uL — ABNORMAL HIGH (ref 3.6–11.0)

## 2016-10-18 MED ORDER — DOCUSATE SODIUM 100 MG PO CAPS: 100.0000 mg | ORAL_CAPSULE | Freq: Every day | ORAL | 3 refills | Status: DC | PRN

## 2016-10-18 MED ORDER — IBUPROFEN 800 MG PO TABS: 800.0000 mg | ORAL_TABLET | Freq: Three times a day (TID) | ORAL | 0 refills | Status: AC | PRN

## 2016-10-18 MED ORDER — OXYCODONE HCL 5 MG PO CAPS: 5.0000 mg | ORAL_CAPSULE | Freq: Four times a day (QID) | ORAL | 0 refills | Status: DC | PRN

## 2016-10-18 MED ORDER — ONDANSETRON 4 MG PO TBDP: 4.0000 mg | ORAL_TABLET | Freq: Three times a day (TID) | ORAL | 0 refills | Status: DC | PRN

## 2016-10-18 NOTE — Care Management (Signed)
Patient has qualified for home 02  for  diagnosis of copd. Updated primary nurse.  Set up with Advanced 02 and portable delivered to the room

## 2016-10-18 NOTE — Discharge Summary (Signed)
  2 Days Post-Op Subjective: The patient is doing well.  No nausea or vomiting. Pain is adequately controlled. Pulmonology was consulted postoperatively due to acute hypoxic episode postoperatively.   Pulmonology treated her for COPD versus her diagnosis of asthma. They gave her bronchodilators, reviewed pulmonary hygiene, and counseled tobacco cessation. She was on 2-3 L of oxygen by nasal cannula while in the hospital. I will send her home with oxygen, and she will follow up with pulmonology as an outpatient for pulmonary function testing, which she has never had.  She also had an episode of tachycardia postoperatively. This is likely due to multiple bronchodilators given to assist her breathing. She did have negative troponins 3, a negative chest x-ray, and by postoperative day 1 her heart rate was regular with normal rate control.  From a postoperative standpoint, she did well. Her pain was controlled with oral medications pretty quickly, her Foley came out and she voided spontaneously by postoperative day 1, and she did well with pain medications.  Objective: Vital signs in last 24 hours: Temp:  [97.6 F (36.4 C)-98.9 F (37.2 C)] 97.6 F (36.4 C) (11/10 1135) Pulse Rate:  [83-88] 86 (11/10 1135) Resp:  [14-18] 14 (11/10 1135) BP: (119-137)/(53-60) 137/58 (11/10 1135) SpO2:  [92 %-98 %] 98 % (11/10 1135) FiO2 (%):  [32 %] 32 % (11/10 0738)  Intake/Output  Intake/Output Summary (Last 24 hours) at 10/18/16 1345 Last data filed at 10/18/16 1055  Gross per 24 hour  Intake          3059.58 ml  Output             2150 ml  Net           909.58 ml    Physical Exam:  General: Alert and oriented. CV: RRR Lungs: Clear bilaterally. GI: Soft, Nondistended. Incisions: Clean and dry. Urine: Clear, Foley in place Extremities: Nontender, no erythema, no edema.  Lab Results:  Recent Labs  10/16/16 1658 10/17/16 0355  HGB 17.7* 16.1*  HCT 52.7* 48.1*  WBC 17.8* 18.4*  PLT 169  150    Imaging: Dg Chest 1 View  Result Date: 10/16/2016 CLINICAL DATA:  Pain and discomfort postop hysterectomy. EXAM: CHEST 1 VIEW COMPARISON:  10/10/2016 FINDINGS: The heart size and mediastinal contours are within normal limits. No pneumonic consolidation, CHF, effusion or pneumothorax. Mild diffuse interstitial prominence as before which may reflect bronchitic change. The visualized skeletal structures are unremarkable. IMPRESSION: No pneumonic consolidation. Mild interstitial prominence as before consistent with bronchitic change. Electronically Signed   By: Ashley Royalty M.D.   On: 10/16/2016 17:44    Assessment/Plan: POD# 2 s/p robotically-assisted total laparoscopic hysterectomy with bilateral salpingectomy, and bilateral pelvic sentinel lymphadenectomy. Poor pulmonary function status. Leukocytosis - afebrile, and no evidence of infection. Precautions given. Repeat CBC prior to discharge pending. Polycythemia  1) Ambulate, Incentive spirometry 2) follow-up with pulmonology as scheduled. 3) follow-up with oncology as scheduled in 2 weeks. 4) she is being discharged home on oxygen, which has already been arranged.   Benjaman Kindler, MD   LOS: 1 day   Benjaman Kindler 10/18/2016, 1:45 PM

## 2016-10-18 NOTE — Discharge Instructions (Signed)
Discharge instructions after  robotically-assisted total laparoscopic hysterectomy  1. Please followup with the pulmonologist to keep your lungs working as well as possible! Signs and Symptoms to Report Call our office at [phone] if you have any of the following.   Fever over 100.4 degrees or higher  Severe stomach pain not relieved with pain medications  Bright red bleeding thats heavier than a period that does not slow with rest  To go the bathroom a lot (frequency), you cant hold your urine (urgency), or it hurts when you empty your bladder (urinate)  Chest pain  Shortness of breath that is new for you  Pain in the calves of your legs  Severe nausea and vomiting not relieved with anti-nausea medications  Signs of infection around your wounds, such as redness, hot to touch, swelling, green/yellow drainage (like pus), bad smelling discharge  Any concerns  What You Can Expect after Surgery  You may see some pink tinged, bloody fluid and bruising around the wound. This is normal.  You may notice shoulder and neck pain. This is caused by the gas used during surgery to expand your abdomen so your surgeon could get to the uterus easier.  You may have a sore throat because of the tube in your mouth during general anesthesia. This will go away in 2 to 3 days.  You may have some stomach cramps.  You may notice spotting on your panties.  You may have pain around the incision sites.   Activities after Your Discharge Follow these guidelines to help speed your recovery at home:  Do the coughing and deep breathing as you did in the hospital for 2 weeks. Use the small blue breathing device, called the incentive spirometer for 2 weeks.  Dont drive if you are in pain or taking narcotic pain medicine. You may drive when you can safely slam on the brakes, turn the wheel forcefully, and rotate your torso comfortably. This is typically 1-2 weeks. Practice in a parking lot or  side street prior to attempting to drive regularly.   Ask others to help with household chores for 4 weeks.  Do not lift anything heavier that 10 pounds for 4-6 weeks. This includes pets, children, and groceries.  Dont do strenuous activities, exercises, or sports like vacuuming, tennis, squash, etc. until your doctor says it is safe to do so. ---Maintain pelvic rest for 12 weeks. This means nothing in the vagina at all (no douching, tampons, intercourse) for 12 weeks.   Walk as you feel able. Rest often since it may take two or three weeks for your energy level to return to normal.   You may climb stairs  Avoid constipation:   -Eat fruits, vegetables, and whole grains. Eat small meals as your appetite will take time to return to normal.   -Drink 6 to 8 glasses of water each day unless your doctor has told you to limit your fluids.   -Use a laxative or stool softener as needed if constipation becomes a problem. You may take Miralax, metamucil, Citrucil, Colace, Senekot, FiberCon, etc. If this does not relieve the constipation, try two tablespoons of Milk Of Magnesia every 8 hours until your bowels move.   You may shower. Gently wash the wounds with a mild soap and water. Pat dry.  Do not get in a hot tub, swimming pool, etc. for 6 weeks.  Do not use lotions, oils, powders on the wounds.  Do not douche, use tampons, or have sex until  your doctor says it is okay.  Take your pain medicine when you need it. The medicine may not work as well if the pain is bad.  Take the medicines you were taking before surgery. Other medications you will need are pain medications (Norco or Percocet) and nausea medications (Zofran).

## 2016-10-18 NOTE — Progress Notes (Signed)
SATURATION QUALIFICATIONS: (This note is used to comply with regulatory documentation for home oxygen)  Patient Saturations on Room Air at Rest 85 %  at 10:07 A 11.10.2017

## 2016-10-18 NOTE — Progress Notes (Signed)
Name: Jessica Dalton MRN: [id number] DOB: [birth year]    ADMISSION DATE:  10/16/2016 CONSULTATION DATE:  10/16/2016  REFERRING MD :  Dr. Leafy Ro  CHIEF COMPLAINT:  Oxygen Desaturation  BRIEF PATIENT DESCRIPTION: This is a 65 yo female presented to [facility] on 11/8 for an elective robotic assisted total hysterectomy with bilateral salpingo oophorectomy and pelvic lymph node dissection due to recent diagnosis of grade 1 endometrial cancer. On 11/8 developed acute hypoxic respiratory failure s/p total hysterectomy.   SIGNIFICANT EVENTS  11/8-Pt had an elective robotic assisted total hysterectomy due to recent diagnosis of grade 1 endometrial cancer 11/8-PCCM consulted for acute hypoxic respiratory failure s/p total hysterectomy and pain management 11/8-Pt transferred to the telemetry unit   STUDIES:  None  HISTORY OF PRESENT ILLNESS:   This is a 65 yo female with a PMH of Differentiated Endometrial Carcinoma (diagnosed 09/16/16), Asthma without status asthmaticus, Morbid obesity, Current everyday smoker (1 to 1.5 PPD for 50 yrs), Gallstones, and Anemia.  She presented to [facility] on 11/8 for an elective robotic assisted total hysterectomy with bilateral salpingo oophorectomy and pelvic lymph node dissection due to recent diagnosis of grade 1 endometrial cancer.  S/P surgery the pt began to desat with an O2 sat of 88% on 3L O2 via nasal canula, therefore she was placed on Bipap for 30 minutes, given duonebs x2 doses, and 125 mg iv solumedrol x1 dose in the PACU symptoms improved. She was subsequently transferred to the OBGYN floor where she continued to desat on nasal canula O2 sats 88% and developed new onset atrial fibrillation requiring transfer to the telemetry unit.  She does endorse wheezing, however states she always wheezes at home she has never had PFT's.  PCCM consulted 11/8 for acute hypoxic respiratory failure s/p total hysterectomy and pain management.   PAST MEDICAL HISTORY :   has a past  medical history of Anemia; Asthma; Cancer ([place]); Gallstones (10/16/2016); Obesity; and Tobacco use.  has a past surgical history that includes Tubal ligation; Cataract extraction, bilateral (2015); Robotic assisted total hysterectomy with bilateral salpingo oophorectomy (Bilateral, 10/16/2016); Sentinel node biopsy (N/A, 10/16/2016); and Pelvic lymph node dissection (N/A, 10/16/2016).  REVIEW OF SYSTEMS:  Positives in BOLD Constitutional: Negative for fever, chills, weight loss, malaise/fatigue and diaphoresis.  HENT: Negative for hearing loss, ear pain, nosebleeds, congestion, sore throat, neck pain, tinnitus and ear discharge.   Eyes: Negative for blurred vision, double vision, photophobia, pain, discharge and redness.  Respiratory: cough, hemoptysis, sputum production, shortness of breath, wheezing and stridor.   Cardiovascular: Negative for chest pain, palpitations, orthopnea, claudication, leg swelling and PND.  Gastrointestinal: heartburn, nausea, vomiting, abdominal pain, diarrhea, constipation, blood in stool and melena.  Genitourinary: Negative for dysuria, urgency, frequency, hematuria and flank pain.  Musculoskeletal: Negative for myalgias, back pain, joint pain and falls.  Skin: Negative for itching and rash.  Neurological: Negative for dizziness, tingling, tremors, sensory change, speech change, focal weakness, seizures, loss of consciousness, weakness and headaches.  Endo/Heme/Allergies: Negative for environmental allergies and polydipsia. Does not bruise/bleed easily.  SUBJECTIVE:  Pt states she is feeling significantly better her only complaint is mild abdominal pain.  VITAL SIGNS: Temp:  [97.6 F (36.4 C)-98.9 F (37.2 C)] 97.6 F (36.4 C) (11/10 1135) Pulse Rate:  [83-88] 86 (11/10 1135) Resp:  [14-18] 14 (11/10 1135) BP: (119-137)/(53-60) 137/58 (11/10 1135) SpO2:  [92 %-98 %] 98 % (11/10 1135) FiO2 (%):  [32 %] 32 % (11/10 0738)  PHYSICAL EXAMINATION: General:  Obese  Caucasian  female, well nourished  Neuro:  Alert and oriented, follows commands, PERRLA HEENT:  Supple, no JVD Cardiovascular:  s1s2, rrr, no M/R/G Lungs:  Mild expiratory wheezes throughout, even, non labored on nasal canula no respiratory distress Abdomen:  Hypoactive BS x4, soft, obese, tender, non distended Musculoskeletal:  Normal bulk and tone Skin: 4 laparoscopic abdominal incision sites open to air, clean, well approximated, no rashes present  ASSESSMENT / PLAN: Acute hypoxic respiratory failure s/p total hysterectomy-improving Likely has undiagnosed COPD (Emphysema vs. Chronic bronchitis) due to extensive smoking history Hx: Current everyday smoker (1 to 1.5 PPD for 50 yrs) Acute pain s/p total hysterectomy New onset atrial fibrillation s/p total hysterectomy-resolved P: Supplemental O2 to maintain O2 sats 88% or above Pt may need home O2 IV Metoprolol for heart rate greater than 120 Scheduled and prn bronchodilators Continue po Prednisone Prn Percocet for pain management Instructed pt to cough and deep breath Incentive spirometry and flutter valve Prn CXR Smoking Cessation counseling  Will need outpatient PFT's once discharged-Establish care with [facility]  Will sign off today 11/10 we appreciate you allowing PCCM to be involved in her care if further assistance is needed please contact us.  Marda Stalker, [place] Pager [phone] (please enter 7 digits) PCCM Consult Pager [phone] (please enter 7 digits)  STAFF NOTE: I, Dr. Vilinda Boehringer have personally reviewed patient's available data, including medical history, events of note, physical examination and test results as part of my evaluation. I have discussed with NP Demaris Callander  and other care providers such as pharmacist, RN and RRT.     A: 65 year old female history of endometrial carcinoma now status post robotic hysterectomy and bilateral salpingo-oophorectomy  with postoperative hypoxia  Postoperative hypoxia Acute respiratory failure-hypoxia History of asthma Tobacco abuse New onset atrial fibrillation-now rate controlled  P:  -Continue with bronchodilators -Pulmonary hygiene with incentive spirometry will be paramount to her overall improvement -Tobacco cessation counseling given to the patient -Out of bed to chair and movement will also be helpful in expanding her lungs and diaphragm -Anticipate she has some postoperative atelectasis, again walking and use of incentive spirometry will be helpful with this -Patient chart has a diagnosis of asthma however given her prolonged smoking history and intermittent wheezing, I believe that she more likely has COPD versus asthma. Classification of her COPD (chronic bronchitis versus emphysema) will be based on a number of clinical factors and pulmonary function testing; both can be performed as an outpatient.  Discharge recommendations: -Continue supplemental oxygen 2 L until follow-up with pulmonary in 2 weeks. -Primary clinic will call patient with follow-up appointment -Continue with prednisone taper and bronchodilators   .  Rest per NP/medical resident whose note is outlined above and that I agree with  Pulmonary Care Time devoted to patient care services described in this note is  35 Minutes.   This time reflects time of care of this signee Dr Vilinda Boehringer.  This critical care time does not reflect procedure time, or teaching time or supervisory time of PA/NP/Med-student/Med Resident etc but could involve care discussion time.  Vilinda Boehringer, MD [facility] Pager [phone] (please enter 7-digits) On Call Pager [phone] (please enter 7-digits)  Note: This note was prepared with Dragon dictation along with smaller phrase technology. Any transcriptional errors that result from this process are unintentional.

## 2016-10-18 NOTE — Progress Notes (Signed)
Patient is discharge home with 02 [place] in use in a stable condition, summary and f/u care given to both pt's and husband, verbalized understanding, left with husband

## 2016-10-18 NOTE — Progress Notes (Signed)
RT placed patient on room air per request from Dr. Stevenson Clinch.  After 15 minutes on room air standing at side of bed, patient sat 85%, c/o dizziness.  This RT let Dr. Stevenson Clinch know about room air sat in person.  RT placed patient back on 2LPM [place], will continue to monitor.

## 2016-10-21 LAB — SURGICAL PATHOLOGY

## 2016-10-22 LAB — CYTOLOGY - NON PAP

## 2016-10-23 ENCOUNTER — Telehealth: Payer: Self-pay | Admitting: Obstetrics and Gynecology

## 2016-10-23 NOTE — Telephone Encounter (Signed)
I contacted Ms. Jessica Dalton regarding her pathology noted below. All great news and no need for further therapy. Plan for follow up as scheduled on 11/13/2016 at [facility].    Cytology: Negative  DIAGNOSIS:  A. SENTINEL LYMPH NODE, LEFT; EXCISION:  - NO TUMOR SEEN IN ONE LYMPH NODE (0/1).   B. SENTINEL LYMPH NODE, LEFT; EXCISION:  - NO TUMOR SEEN IN TWO LYMPH NODES (0/2).   C. UTERUS WITH CERVIX, BILATERAL FALLOPIAN TUBES AND OVARIES;  HYSTERECTOMY WITH BILATERAL SALPINGO-OOPHORECTOMY:  - ENDOMETRIOID ADENOCARCINOMA FIGO I.  - SURFACE DEGENERATIVE PAPILLARY FEATURES.  - MYOMETRIAL INVASION IS NOT IDENTIFIED.  - CHRONIC CERVICITIS.  - STROMAL HYPERPLASIA OF BILATERAL OVARIES.  - NO PATHOLOGIC CHANGE, BILATERAL FALLOPIAN TUBES.  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    ENDOMETRIUM: Hysterectomy, With or Without Other Organs or Tissues  Specimens InvolvedA: Sentinel lymph node, left  B: Sentinel lymph node, right proximal obturator  C: Uterus with cervix, bilateral tubes and ovaries   Endometrium, Hysterectomy, With or Without Other Organs or Tissues  Cancer Case Summary  Specimen: Uterine corpus  SPECIMEN  Procedure  Simple hysterectomy  Additional Procedures:  Bilateral salpingo-oophorectomy  Lymph Node Sampling:   Performed  Pelvic lymph nodes  Specimen Integrity: Intact hysterectomy specimen  TUMOR  Histologic Type:  Endometrioid adenocarcinoma, not otherwise  characterized  Histologic Grade:  FIGO grade 1  EXTENT  Tumor Size:  Greatest dimension (cm)  4.2cm  Myometrial Invasion:   Not identified  Involvement of Cervix:  Not involved  Other Organs Submitted: Right ovary  Not involved  Left ovary  Not involved  Right fallopian tube  Not involved  Left fallopian tube  Not involved  ACCESSORY FINDINGS  Lymph-Vascular Invasion: Not identified  STAGE (pTNM [FIGO])  Primary Tumor (pT):  pT1a [IA]: Tumor limited to  endometrium or invades less than one-half  of the myometrium  Regional Lymph Nodes (pN)  pN0: No regional lymph node metastasis  Pelvic Lymph Nodes: Number of Lymph Nodes Examined:  Specify  3  Number of Lymph Nodes Involved:  Specify  0  Para-aortic Lymph Nodes: No para-aortic nodes submitted or found  Distant Metastasis (pM): Not applicable   Gillis Ends, MD

## 2016-10-24 ENCOUNTER — Encounter: Payer: Self-pay | Admitting: *Deleted

## 2016-10-30 ENCOUNTER — Ambulatory Visit: Payer: Medicare HMO

## 2016-11-06 ENCOUNTER — Encounter: Payer: Self-pay | Admitting: General Surgery

## 2016-11-06 ENCOUNTER — Ambulatory Visit (INDEPENDENT_AMBULATORY_CARE_PROVIDER_SITE_OTHER): Payer: Medicare HMO | Admitting: General Surgery

## 2016-11-06 VITALS — BP 164/84 | HR 82 | Resp 16 | Ht 61.0 in | Wt 229.0 lb

## 2016-11-06 DIAGNOSIS — K8020 Calculus of gallbladder without cholecystitis without obstruction: Secondary | ICD-10-CM

## 2016-11-06 NOTE — Patient Instructions (Signed)
The patient is aware to call back for any questions or concerns.  Laparoscopic Cholecystectomy Laparoscopic cholecystectomy is surgery to remove the gallbladder. The gallbladder is a pear-shaped organ that lies beneath the liver on the right side of the body. The gallbladder stores bile, which is a fluid that helps the body to digest fats. Cholecystectomy is often done for inflammation of the gallbladder (cholecystitis). This condition is usually caused by a buildup of gallstones (cholelithiasis) in the gallbladder. Gallstones can block the flow of bile, which can result in inflammation and pain. In severe cases, emergency surgery may be required. This procedure is done though small incisions in your abdomen (laparoscopic surgery). A thin scope with a camera (laparoscope) is inserted through one incision. Thin surgical instruments are inserted through the other incisions. In some cases, a laparoscopic procedure may be turned into a type of surgery that is done through a larger incision (open surgery). Tell a health care provider about:  Any allergies you have.  All medicines you are taking, including vitamins, herbs, eye drops, creams, and over-the-counter medicines.  Any problems you or family members have had with anesthetic medicines.  Any blood disorders you have.  Any surgeries you have had.  Any medical conditions you have.  Whether you are pregnant or may be pregnant. What are the risks? Generally, this is a safe procedure. However, problems may occur, including:  Infection.  Bleeding.  Allergic reactions to medicines.  Damage to other structures or organs.  A stone remaining in the common bile duct. The common bile duct carries bile from the gallbladder into the small intestine.  A bile leak from the cyst duct that is clipped when your gallbladder is removed. What happens before the procedure? Staying hydrated  Follow instructions from your health care provider about  hydration, which may include:  Up to 2 hours before the procedure - you may continue to drink clear liquids, such as water, clear fruit juice, black coffee, and plain tea. Eating and drinking restrictions  Follow instructions from your health care provider about eating and drinking, which may include:  8 hours before the procedure - stop eating heavy meals or foods such as meat, fried foods, or fatty foods.  6 hours before the procedure - stop eating light meals or foods, such as toast or cereal.  6 hours before the procedure - stop drinking milk or drinks that contain milk.  2 hours before the procedure - stop drinking clear liquids. Medicines   Ask your health care provider about:  Changing or stopping your regular medicines. This is especially important if you are taking diabetes medicines or blood thinners.  Taking medicines such as aspirin and ibuprofen. These medicines can thin your blood. Do not take these medicines before your procedure if your health care provider instructs you not to.  You may be given antibiotic medicine to help prevent infection. General instructions   Let your health care provider know if you develop a cold or an infection before surgery.  Plan to have someone take you home from the hospital or clinic.  Ask your health care provider how your surgical site will be marked or identified. What happens during the procedure?  To reduce your risk of infection:  Your health care team will wash or sanitize their hands.  Your skin will be washed with soap.  Hair may be removed from the surgical area.  An IV tube may be inserted into one of your veins.  You will be given   one or more of the following:  A medicine to help you relax (sedative).  A medicine to make you fall asleep (general anesthetic).  A breathing tube will be placed in your mouth.  Your surgeon will make several small cuts (incisions) in your abdomen.  The laparoscope will be  inserted through one of the small incisions. The camera on the laparoscope will send images to a TV screen (monitor) in the operating room. This lets your surgeon see inside your abdomen.  Air-like gas will be pumped into your abdomen. This will expand your abdomen to give the surgeon more room to perform the surgery.  Other tools that are needed for the procedure will be inserted through the other incisions. The gallbladder will be removed through one of the incisions.  Your common bile duct may be examined. If stones are found in the common bile duct, they may be removed.  After your gallbladder has been removed, the incisions will be closed with stitches (sutures), staples, or skin glue.  Your incisions may be covered with a bandage (dressing). The procedure may vary among health care providers and hospitals. What happens after the procedure?  Your blood pressure, heart rate, breathing rate, and blood oxygen level will be monitored until the medicines you were given have worn off.  You will be given medicines as needed to control your pain.  Do not drive for 24 hours if you were given a sedative. This information is not intended to replace advice given to you by your health care provider. Make sure you discuss any questions you have with your health care provider. Document Released: 11/25/2005 Document Revised: 06/16/2016 Document Reviewed: 05/13/2016 Elsevier Interactive Patient Education  2017 [employer]   

## 2016-11-06 NOTE — Progress Notes (Addendum)
Patient ID: Jessica Dalton, female   DOB: Dec 05, 1951, 64 y.o.   MRN: [id number]  Chief Complaint  Patient presents with  . Abdominal Pain    HPI Jessica Dalton is a 65 y.o. female here today for a evaluation of gallstones. Patient had a CT scan done on 09/24/2016. The CT scan was done prior to her hysterectomy and gallstones was seen on the scan. She states she does have some right upper abdominal pain on a daily basis. She states when she first wakes up she does not have pain. She notices the pain comes after sitting. Described as a "pinching" pain. Not associated with foods other than chinese foods. She has vomited with the pain in the past. Last episode of vomiting was 3 months ago, woke her up at night. She states the pain has been occurring for several years.   Weight is stable since retirement 2 years ago.  HPI  Past Medical History:  Diagnosis Date  . Anemia   . Asthma   . Cancer ([place])    uterine ca  . Endometrial cancer ([place])   . Gallstones 10/16/2016  . Obesity   . Tobacco use     Past Surgical History:  Procedure Laterality Date  . CATARACT EXTRACTION, BILATERAL  2015  . PELVIC LYMPH NODE DISSECTION N/A 10/16/2016   Procedure: PELVIC LYMPH NODE DISSECTION;  Surgeon: Gillis Ends, MD;  Location: [facility] ORS;  Service: Gynecology;  Laterality: N/A;  . ROBOTIC ASSISTED TOTAL HYSTERECTOMY WITH BILATERAL SALPINGO OOPHERECTOMY Bilateral 10/16/2016   Procedure: ROBOTIC ASSISTED TOTAL HYSTERECTOMY WITH BILATERAL SALPINGO OOPHORECTOMY;  Surgeon: Gillis Ends, MD;  Location: [facility] ORS;  Service: Gynecology;  Laterality: Bilateral;  . SENTINEL NODE BIOPSY N/A 10/16/2016   Procedure: SENTINEL NODE INJECTION;  Surgeon: Gillis Ends, MD;  Location: [facility] ORS;  Service: Gynecology;  Laterality: N/A;  . TUBAL LIGATION      Family History  Problem Relation Age of Onset  . Cancer Mother     renal cell cancer early 91's  . Cancer Father     colon cancer late  60's   . Cancer Sister     melanoma  age 33; cervical vs endometrial cancer age?    Social History Social History  Substance Use Topics  . Smoking status: Current Every Day Smoker    Packs/day: 0.50    Years: 50.00  . Smokeless tobacco: Never Used     Comment: 75 pk yr - 1.5 ppd; 81 years; started age 38  . Alcohol use No    No Known Allergies  Current Outpatient Prescriptions  Medication Sig Dispense Refill  . acetaminophen (TYLENOL) 500 MG tablet Take 1,500 mg by mouth daily as needed for mild pain.    [name] docusate sodium (COLACE) 100 MG capsule Take 1 capsule (100 mg total) by mouth daily as needed for mild constipation. 60 capsule 3  . ibuprofen (ADVIL,MOTRIN) 200 MG tablet Take 400 mg by mouth every 6 (six) hours as needed for headache or moderate pain.    . polyethylene glycol (MIRALAX / GLYCOLAX) packet Take 17 g by mouth daily as needed for mild constipation or moderate constipation.    [name] albuterol (PROVENTIL HFA;VENTOLIN HFA) 108 (90 Base) MCG/ACT inhaler Inhale 2 puffs into the lungs every 6 (six) hours as needed for wheezing or shortness of breath. 1 Inhaler 1  . apixaban (ELIQUIS) 5 MG TABS tablet Take by mouth.    [name] aspirin EC 81 MG EC tablet Take 1 tablet (81  mg total) by mouth daily.    . calcium carbonate (TUMS - DOSED IN MG ELEMENTAL CALCIUM) 500 MG chewable tablet Chew 1 tablet (200 mg of elemental calcium total) by mouth 2 (two) times daily.    [name] diltiazem (CARDIZEM CD) 120 MG 24 hr capsule Take 1 capsule (120 mg total) by mouth daily. 30 capsule 0  . furosemide (LASIX) 20 MG tablet Take 1 tablet (20 mg total) by mouth 2 (two) times daily. 60 tablet 0  . nicotine (NICODERM CQ - DOSED IN MG/24 HOURS) 14 mg/24hr patch Place 1 patch (14 mg total) onto the skin daily. 30 patch 10  . nicotine (NICOTROL) 10 MG inhaler Inhale 1 Cartridge (1 continuous puffing total) into the lungs as needed for smoking cessation. 42 each 10  . potassium chloride (K-DUR) 10 MEQ tablet Take 1  tablet (10 mEq total) by mouth daily. 30 tablet 0  . predniSONE (STERAPRED UNI-PAK 21 TAB) 10 MG (21) TBPK tablet Take 1 tablet (10 mg total) by mouth daily. Take 6 tablets by mouth for 1 day followed by  5 tablets by mouth for 1 day followed by  4 tablets by mouth for 1 day followed by  3 tablets by mouth for 1 day followed by  2 tablets by mouth for 1 day followed by  1 tablet by mouth for a day and stop 21 tablet 0  . traMADol (ULTRAM) 50 MG tablet Take 1 tablet (50 mg total) by mouth every 6 (six) hours as needed for moderate pain. 30 tablet 0  . umeclidinium-vilanterol (ANORO ELLIPTA) 62.5-25 MCG/INH AEPB Inhale 1 puff into the lungs daily. 60 each 10   No current facility-administered medications for this visit.     Review of Systems Review of Systems  Constitutional: Negative.   Respiratory: Negative.   Cardiovascular: Negative.   Gastrointestinal: Positive for abdominal pain and vomiting.    Blood pressure (!) 164/84, pulse 82, resp. rate 16, height 5\' 1"  (1.549 m), weight 229 lb (103.9 kg), SpO2 95 %.  Physical Exam Physical Exam  Constitutional: She is oriented to person, place, and time. She appears well-developed and well-nourished.  HENT:  Mouth/Throat: Oropharynx is clear and moist.  Eyes: Conjunctivae are normal. No scleral icterus.  Neck: Neck supple.  Cardiovascular: Normal rate, regular rhythm and normal heart sounds.   Pulmonary/Chest: Effort normal and breath sounds normal.  Red rash right lower breast.  Abdominal: Soft. Normal appearance and bowel sounds are normal. There is tenderness in the right upper quadrant.  4 abdominal lap sites well healed.  Lymphadenopathy:    She has no cervical adenopathy.  Neurological: She is alert and oriented to person, place, and time.  Skin: Skin is warm and dry.  Psychiatric: Her behavior is normal.    Data Reviewed Pathology showed endometrial cancer, pTa, N0. FICO  Grade 1.(October 16, 2016)  CT scan of the  abdomen and pelvis obtained 09/24/2016 showed gallstones measuring up to 3 cm in diameter. Calcification noted. No gallbladder wall thickening. No extrahepatic biliary distention. 1.4 cm indeterminate left adrenal nodule. Porta hepatic lymph node measuring 1.5 cm. The CT scan was reviewed with special attention of the right upper quadrant. No evidence of colonic abnormality in this area to account for her long-standing abdominal pain. No duodenal distention. The gallbladder is significantly distended extending to the anterior abdominal wall.  Comprehensive metabolic panel dated [id number] was unremarkable.  CBC of the same date showed a hemoglobin of 18.2 and MCV of 94, white  blood cell count 9100. (Elevated hemoglobin likely secondary to long-term smoking).  Postprocedure hemoglobin 16.1 with a white blood cell count of 18,400 on 10/17/2016. No differential.  Assessment    Long-standing abdominal pain, possible biliary source.  Chronic cholecystitis with calcified gallstones.  Good recovery post robotic-assisted hysterectomy. Postoperative hypoxia requiring O2 supplementation resolved.  Decrease smoking since surgery, 2 packs per day one half pack per day.    Plan    The patient reports years of abdominal pain, worse in the last year and this is indeed what prompted her initial medical assessment and as an "old by the by" she mentioned postmenopausal bleeding resulting in her identification with early stage endometrial cancer.  The patient's aware that no guarantee that her abdominal symptoms will resolve with cholecystectomy, but as this is the only abnormality on clinical exam or imaging this is appropriate procedure to consider.     Recommend screening colonoscopy in the future.  Laparoscopic Cholecystectomy with Intraoperative Cholangiogram. The procedure, including it's potential risks and complications (including but not limited to infection, bleeding, injury to intra-abdominal  organs or bile ducts, bile leak, poor cosmetic result, sepsis and death) were discussed with the patient in detail. Non-operative options, including their inherent risks (acute calculous cholecystitis with possible choledocholithiasis or gallstone pancreatitis, with the risk of ascending cholangitis, sepsis, and death) were discussed as well. The patient expressed and understanding of what we discussed and wishes to proceed with laparoscopic cholecystectomy. The patient further understands that if it is technically not possible, or it is unsafe to proceed laparoscopically, that I will convert to an open cholecystectomy.  She had requested something for pain, but was discouraged from use of narcotics prior to procedure. Symptomatic measures with OTC anti-inflammatories/Tylenol/heating pad has been encouraged.  Patient's surgery has been scheduled for 11-18-16 at [facility].  This information has been scribed by Karie Fetch RN, BSN,BC.  Robert Bellow 12/12/2016, 8:44 AM

## 2016-11-07 ENCOUNTER — Ambulatory Visit (INDEPENDENT_AMBULATORY_CARE_PROVIDER_SITE_OTHER): Payer: Medicare HMO | Admitting: Pulmonary Disease

## 2016-11-07 ENCOUNTER — Encounter: Payer: Self-pay | Admitting: Pulmonary Disease

## 2016-11-07 VITALS — BP 132/86 | HR 100 | Ht 61.0 in | Wt 230.2 lb

## 2016-11-07 DIAGNOSIS — Z01811 Encounter for preprocedural respiratory examination: Secondary | ICD-10-CM | POA: Diagnosis not present

## 2016-11-07 DIAGNOSIS — J449 Chronic obstructive pulmonary disease, unspecified: Secondary | ICD-10-CM | POA: Diagnosis not present

## 2016-11-07 DIAGNOSIS — J9611 Chronic respiratory failure with hypoxia: Secondary | ICD-10-CM | POA: Diagnosis not present

## 2016-11-07 DIAGNOSIS — J42 Unspecified chronic bronchitis: Secondary | ICD-10-CM | POA: Diagnosis not present

## 2016-11-07 DIAGNOSIS — F17200 Nicotine dependence, unspecified, uncomplicated: Secondary | ICD-10-CM

## 2016-11-07 MED ORDER — NICOTINE 10 MG IN INHA: 1.0000 | RESPIRATORY_TRACT | 10 refills | Status: DC | PRN

## 2016-11-07 MED ORDER — NICOTINE 14 MG/24HR TD PT24: 14.0000 mg | MEDICATED_PATCH | TRANSDERMAL | 10 refills | Status: DC

## 2016-11-07 MED ORDER — UMECLIDINIUM-VILANTEROL 62.5-25 MCG/INH IN AEPB: 1.0000 | INHALATION_SPRAY | Freq: Every day | RESPIRATORY_TRACT | 0 refills | Status: DC

## 2016-11-07 MED ORDER — UMECLIDINIUM-VILANTEROL 62.5-25 MCG/INH IN AEPB: 1.0000 | INHALATION_SPRAY | Freq: Every day | RESPIRATORY_TRACT | 10 refills | Status: DC

## 2016-11-07 NOTE — Progress Notes (Signed)
Patient ID: Jessica Dalton, female   DOB: 11/29/1951, 65 y.o.   MRN: [id number] Patient seen in the office today and instructed on use of ANORO ELLIPTA.  Patient expressed understanding and demonstrated technique.

## 2016-11-07 NOTE — Patient Instructions (Addendum)
1) It is very important that you quit smoking NOW to reduce your risk of complications around the time of surgery!!  2) We discussed nicotine replacement therapy (NRT) and we have decided on the following  - Nicotine patch - 14 mg strength - place in morning and remove at night  - Nicotrol inhaler - may be used for those times when urge is strongest (even with patch on). One cartridge per day  3) Continue oxygen therapy as close to 24 hrs per day as possible  4) Anoro inhaler - one inhalation daily  5) Follow up in 4-6 weeks

## 2016-11-08 ENCOUNTER — Inpatient Hospital Stay: Payer: Medicare HMO

## 2016-11-08 ENCOUNTER — Inpatient Hospital Stay: Payer: Medicare HMO | Attending: Internal Medicine | Admitting: Internal Medicine

## 2016-11-08 ENCOUNTER — Encounter: Payer: Self-pay | Admitting: *Deleted

## 2016-11-08 DIAGNOSIS — Z90722 Acquired absence of ovaries, bilateral: Secondary | ICD-10-CM | POA: Insufficient documentation

## 2016-11-08 DIAGNOSIS — Z79899 Other long term (current) drug therapy: Secondary | ICD-10-CM

## 2016-11-08 DIAGNOSIS — D751 Secondary polycythemia: Secondary | ICD-10-CM | POA: Diagnosis not present

## 2016-11-08 DIAGNOSIS — Z90710 Acquired absence of both cervix and uterus: Secondary | ICD-10-CM | POA: Insufficient documentation

## 2016-11-08 DIAGNOSIS — C541 Malignant neoplasm of endometrium: Secondary | ICD-10-CM | POA: Insufficient documentation

## 2016-11-08 DIAGNOSIS — J449 Chronic obstructive pulmonary disease, unspecified: Secondary | ICD-10-CM | POA: Diagnosis not present

## 2016-11-08 DIAGNOSIS — E669 Obesity, unspecified: Secondary | ICD-10-CM | POA: Diagnosis not present

## 2016-11-08 DIAGNOSIS — F17210 Nicotine dependence, cigarettes, uncomplicated: Secondary | ICD-10-CM | POA: Insufficient documentation

## 2016-11-08 LAB — COMPREHENSIVE METABOLIC PANEL
ALT: 14 U/L (ref 14–54)
ANION GAP: 7 (ref 5–15)
AST: 16 U/L (ref 15–41)
Albumin: 4.2 g/dL (ref 3.5–5.0)
Alkaline Phosphatase: 72 U/L (ref 38–126)
BUN: 15 mg/dL (ref 6–20)
CHLORIDE: 100 mmol/L — AB (ref 101–111)
CO2: 30 mmol/L (ref 22–32)
CREATININE: 0.56 mg/dL (ref 0.44–1.00)
Calcium: 8.8 mg/dL — ABNORMAL LOW (ref 8.9–10.3)
GFR calc non Af Amer: >60 mL/min (ref >60)
Glucose, Bld: 99 mg/dL (ref 65–99)
POTASSIUM: 3.8 mmol/L (ref 3.5–5.1)
SODIUM: 137 mmol/L (ref 135–145)
Total Bilirubin: 0.5 mg/dL (ref 0.3–1.2)
Total Protein: 7.1 g/dL (ref 6.5–8.1)

## 2016-11-08 LAB — CBC WITH DIFFERENTIAL/PLATELET
Basophils Absolute: 0.1 10*3/uL (ref 0–0.1)
Basophils Relative: 1 %
EOS ABS: 0.3 10*3/uL (ref 0–0.7)
EOS PCT: 4 %
HCT: 43.8 % (ref 35.0–47.0)
Hemoglobin: 15.1 g/dL (ref 12.0–16.0)
LYMPHS ABS: 2.8 10*3/uL (ref 1.0–3.6)
Lymphocytes Relative: 35 %
MCH: 31.1 pg (ref 26.0–34.0)
MCHC: 34.5 g/dL (ref 32.0–36.0)
MCV: 90.3 fL (ref 80.0–100.0)
Monocytes Absolute: 0.6 10*3/uL (ref 0.2–0.9)
Monocytes Relative: 8 %
Neutro Abs: 4.2 10*3/uL (ref 1.4–6.5)
Neutrophils Relative %: 52 %
PLATELETS: 223 10*3/uL (ref 150–440)
RBC: 4.85 MIL/uL (ref 3.80–5.20)
RDW: 13 % (ref 11.5–14.5)
WBC: 8 10*3/uL (ref 3.6–11.0)

## 2016-11-08 LAB — LACTATE DEHYDROGENASE: LDH: 135 U/L (ref 98–192)

## 2016-11-08 NOTE — Assessment & Plan Note (Signed)
Hemoglobin 17 hematocrit 52- approximately a month ago. Asymptomatic. Question secondary erythrocytosis versus primary. This factors include smoking/obesity/COPD. Check CBC LDH CMP jack 2 mutation; erythropoietin level. Also discussed the use of phlebotomy for symptomatic relief.  # Endometrial cancer- FIGO stage I- no adjuvant therapy. Patient will continue follow-up with GYN oncology.  # Recommend follow-up in 2-3 weeks to review the above workup.   Thank you Dr. Netty Starring for allowing me to participate in the care of your pleasant patient. Please do not hesitate to contact me with questions or concerns in the interim.

## 2016-11-08 NOTE — Progress Notes (Signed)
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     HISTORY OF PRESENTING ILLNESS:  Jessica Dalton 65 y.o.  female patient history of smoking obesity and also COPD noted to have elevated hemoglobin up to 17 hematocrit 52 approximately a month ago with the PCPs office. She denies any unusual headaches or vision changes. Denies any history of blood clots or other thrombotic complications. Chronic mild fatigue.  Patient also recently diagnosed with endometrial cancer stage I status post TAH/BSO. No adjuvant therapy recommended. Patient is awaiting a gallbladder surgery.   ROS: A complete 10 point review of system is done which is negative except mentioned above in history of present illness  MEDICAL HISTORY:  Past Medical History:  Diagnosis Date  . Anemia   . Asthma   . Cancer ([place])    uterine ca  . Endometrial cancer ([place])   . Gallstones 10/16/2016  . Obesity   . Tobacco use     SURGICAL HISTORY: Past Surgical History:  Procedure Laterality Date  . CATARACT EXTRACTION, BILATERAL  2015  . PELVIC LYMPH NODE DISSECTION N/A 10/16/2016   Procedure: PELVIC LYMPH NODE DISSECTION;  Surgeon: Gillis Ends, MD;  Location: [facility] ORS;  Service: Gynecology;  Laterality: N/A;  . ROBOTIC ASSISTED TOTAL HYSTERECTOMY WITH BILATERAL SALPINGO OOPHERECTOMY Bilateral 10/16/2016   Procedure: ROBOTIC ASSISTED TOTAL HYSTERECTOMY WITH BILATERAL SALPINGO OOPHORECTOMY;  Surgeon: Gillis Ends, MD;  Location: [facility] ORS;  Service: Gynecology;  Laterality:  Bilateral;  . SENTINEL NODE BIOPSY N/A 10/16/2016   Procedure: SENTINEL NODE INJECTION;  Surgeon: Gillis Ends, MD;  Location: [facility] ORS;  Service: Gynecology;  Laterality: N/A;  . TUBAL LIGATION      SOCIAL HISTORY: smoking/cuting down.  Social History   Social History  . Marital status: Married    Spouse name: N/A  . Number of children: N/A  . Years of education: N/A   Occupational History  . Not on file.   Social History Main Topics  . Smoking status: Current Every Day Smoker    Packs/day: 0.50    Years: 50.00  . Smokeless tobacco: Never Used     Comment: 75 pk yr - 1.5 ppd; 66 years; started age 45  . Alcohol use No  . Drug use: No  . Sexual activity: Not on file   Other Topics Concern  . Not on file   Social History Narrative  . No narrative on file    FAMILY HISTORY: Family History  Problem Relation Age of Onset  . Cancer Mother     renal cell cancer early 60's  . Cancer Father     colon cancer late 2's   . Cancer Sister     melanoma  age 53; cervical vs endometrial cancer age?    ALLERGIES:  has No Known Allergies.  MEDICATIONS:  Current Outpatient Prescriptions  Medication Sig Dispense Refill  . acetaminophen (TYLENOL) 500 MG tablet Take 1,500 mg by mouth daily as needed for mild pain.    [name] docusate sodium (COLACE) 100 MG capsule Take 1 capsule (100 mg total) by mouth daily as needed for mild  constipation. 60 capsule 3  . ibuprofen (ADVIL,MOTRIN) 200 MG tablet Take 400 mg by mouth every 6 (six) hours as needed for headache or moderate pain.    . nicotine (NICODERM CQ - DOSED IN MG/24 HOURS) 14 mg/24hr patch Place 1 patch (14 mg total) onto the skin daily. 30 patch 10  . polyethylene glycol (MIRALAX / GLYCOLAX) packet Take 17 g by mouth daily as needed for mild constipation or moderate constipation.    [name] umeclidinium-vilanterol (ANORO ELLIPTA) 62.5-25 MCG/INH AEPB Inhale 1 puff into the lungs daily. 60 each 10  . nicotine (NICOTROL) 10 MG  inhaler Inhale 1 Cartridge (1 continuous puffing total) into the lungs as needed for smoking cessation. (Patient not taking: Reported on 11/08/2016) 42 each 10   No current facility-administered medications for this visit.       [name]  PHYSICAL EXAMINATION: ECOG PERFORMANCE STATUS: 0 - Asymptomatic  Vitals:   11/08/16 1126  BP: 130/61  Pulse: 74  Resp: 18  Temp: (!) 96.9 F (36.1 C)   Filed Weights   11/08/16 1126  Weight: 231 lb 3.2 oz (104.9 kg)    GENERAL: Well-nourished well-developed; Alert, no distress and comfortable.   Alone. Obese.  EYES: no pallor or icterus OROPHARYNX: no thrush or ulceration; good dentition  NECK: supple, no masses felt LYMPH:  no palpable lymphadenopathy in the cervical, axillary or inguinal regions LUNGS: Decreased air entry to auscultation and  No wheeze or crackles HEART/CVS: regular rate & rhythm and no murmurs; No lower extremity edema ABDOMEN: abdomen soft, non-tender and normal bowel sounds Musculoskeletal:no cyanosis of digits and no clubbing  PSYCH: alert & oriented x 3 with fluent speech NEURO: no focal motor/sensory deficits SKIN:  no rashes or significant lesions  LABORATORY DATA:  I have reviewed the data as listed Lab Results  Component Value Date   WBC 8.0 11/08/2016   HGB 15.1 11/08/2016   HCT 43.8 11/08/2016   MCV 90.3 11/08/2016   PLT 223 11/08/2016    Recent Labs  10/10/16 1052 10/16/16 1658 10/17/16 0355 11/08/16 1221  NA 139 139 139 137  K 4.1 4.0 3.9 3.8  CL 102 104 102 100*  CO2 31 26 31 30   GLUCOSE 92 165* 142* 99  BUN 11 9 7 15   CREATININE 0.55 0.57 0.41* 0.56  CALCIUM 9.0 8.7* 8.6* 8.8*  GFRNONAA >60 >60 >60 >60  GFRAA >60 >60 >60 >60  PROT 7.2  --   --  7.1  ALBUMIN 4.1  --   --  4.2  AST 13*  --   --  16  ALT 11*  --   --  14  ALKPHOS 79  --   --  72  BILITOT 0.4  --   --  0.5    RADIOGRAPHIC STUDIES: I have personally reviewed the radiological images as listed and agreed with the findings in  the report. Dg Chest 1 View  Result Date: 10/16/2016 CLINICAL DATA:  Pain and discomfort postop hysterectomy. EXAM: CHEST 1 VIEW COMPARISON:  10/10/2016 FINDINGS: The heart size and mediastinal contours are within normal limits. No pneumonic consolidation, CHF, effusion or pneumothorax. Mild diffuse interstitial prominence as before which may reflect bronchitic change. The visualized skeletal structures are unremarkable. IMPRESSION: No pneumonic consolidation. Mild interstitial prominence as before consistent with bronchitic change. Electronically Signed   By: Ashley Royalty M.D.   On: 10/16/2016 17:44   Dg Chest 2 View  Result Date: 10/10/2016 CLINICAL DATA:  Preoperative examination prior to  hysterectomy. Patient is a current smoker and has a history of asthma and endometrial malignancy. EXAM: CHEST  2 VIEW COMPARISON:  Report only of chest x-ray dated February 25, 2001. FINDINGS: The lungs are hyperinflated with hemidiaphragm flattening. The interstitial markings are coarse. There is no alveolar infiltrate or pleural effusion. The heart and pulmonary vascularity are normal. The mediastinum is normal in width. There is calcification in the wall of the aortic arch. There is mild multilevel degenerative disc disease of the thoracic spine. IMPRESSION: Hyperinflation and mild diffuse interstitial prominence consistent with the history of reactive airway disease. There is no pneumonia, CHF, nor other acute cardiopulmonary abnormality. Aortic atherosclerosis. Electronically Signed   By: David  Martinique M.D.   On: 10/10/2016 13:08    ASSESSMENT & PLAN:   Erythrocytosis Hemoglobin 17 hematocrit 52- approximately a month ago. Asymptomatic. Question secondary erythrocytosis versus primary. This factors include smoking/obesity/COPD. Check CBC LDH CMP jack 2 mutation; erythropoietin level. Also discussed the use of phlebotomy for symptomatic relief.  # Endometrial cancer- FIGO stage I- no adjuvant therapy. Patient will  continue follow-up with GYN oncology.  # Recommend follow-up in 2-3 weeks to review the above workup.   Thank you Dr. Netty Starring for allowing me to participate in the care of your pleasant patient. Please do not hesitate to contact me with questions or concerns in the interim.   All questions were answered. The patient knows to call the clinic with any problems, questions or concerns.    Cammie Sickle, MD 11/08/2016 1:41 PM

## 2016-11-08 NOTE — Progress Notes (Signed)
Patient is here today for new patient appointment she is cutting back on smoking, she has pain but she is having surgery dec 11

## 2016-11-09 LAB — ERYTHROPOIETIN: ERYTHROPOIETIN: 5.5 m[IU]/mL (ref 2.6–18.5)

## 2016-11-10 NOTE — Progress Notes (Signed)
PULMONARY POST HOSPITAL FOLLOW UP  PROBLEMS: Smoker Chronic hypoxemic respiratory failure COPD  PT PROFILE: 65 y.o. F smoker hospitalized 11/08-11/10/17 for elective hysterectomy. Noted to be hypoxemic post operatively. Seen in consultation by Dr Stevenson Clinch. Discharged home on O2@ 2 LPM Jessica Dalton  DATA: CXR 10/10/16: Hyperinflation and mild diffuse interstitial prominence  INTERVAL HISTORY: No major events since discharge  SUBJ: 65 y.o. F smoker hospitalized 11/08-11/10/17 for elective hysterectomy. Noted to be hypoxemic post operatively. Seen in consultation by Dr Stevenson Clinch. Discharged home on O2@ 2 LPM Jessica Dalton. Still smoking 10 cigs/day. Reports clas II/III dyspnea. Has chronic hacking cough. Denies CP, fever, purulent sputum, hemoptysis, LE edema and calf tenderness. Reports plan for laparoscopic cholecystectomy by Dr Tollie Pizza 11/18/16  OBJ: Vitals:   11/07/16 0905  BP: 132/86  Pulse: 100  SpO2: 95%  Weight: 230 lb 3.2 oz (104.4 kg)  Height: 5\' 1"  (1.549 m)  2 LPM Jessica Dalton  NAD HEENT WNL NO JVD noted BS diminished, no wheezes Reg, no M NABS, soft No LE edema  DATA: No new CXR  IMPRESSION: Smoker  Chronic obstructive pulmonary disease, unspecified COPD type ([facility])  Chronic bronchitis, unspecified chronic bronchitis type ([place])  Chronic hypoxemic respiratory failure ([place])  Pre-operative respiratory examination  We discussed the imperative of smoking cessation including strategies. I emphasized that quitting smoking prior to surgery reduces the risk of peri-operative pulmonary complications.   PLAN: 1) Smoking cessation was discussed in detail including the risks associated with continued smoking, the benefits of smoking cessation and strategies that might be helpful. After thorough consideration, we have decided on Nicotine patch 14 mg strength daily plus nicotrol inhaler, one cartridge per day 2) Anoro inhaler - one inhalation daily. Sample provided and Rx sent to pharmacy 3) Continue  oxygen therapy @ 2 lpm [place] as close to 24 hrs per day 4) If she has any pulmonary problems after cholecystectomy and requires hospitalization, the pulmonary service should be notified to participate in her care 5) Otherwise, follow up in 4-6 weeks   Jessica Border, MD PCCM service Mobile [phone] Pager [phone] 11/10/2016

## 2016-11-11 ENCOUNTER — Emergency Department: Payer: Medicare HMO

## 2016-11-11 ENCOUNTER — Inpatient Hospital Stay
Admission: EM | Admit: 2016-11-11 | Discharge: 2016-11-15 | DRG: 871 | Disposition: A | Discharge status: Home / Self Care | Payer: Medicare HMO | Attending: Internal Medicine | Admitting: Internal Medicine

## 2016-11-11 ENCOUNTER — Encounter: Payer: Self-pay | Admitting: Emergency Medicine

## 2016-11-11 DIAGNOSIS — I5031 Acute diastolic (congestive) heart failure: Secondary | ICD-10-CM | POA: Diagnosis present

## 2016-11-11 DIAGNOSIS — K219 Gastro-esophageal reflux disease without esophagitis: Secondary | ICD-10-CM | POA: Diagnosis present

## 2016-11-11 DIAGNOSIS — Z90710 Acquired absence of both cervix and uterus: Secondary | ICD-10-CM

## 2016-11-11 DIAGNOSIS — J441 Chronic obstructive pulmonary disease with (acute) exacerbation: Secondary | ICD-10-CM | POA: Diagnosis present

## 2016-11-11 DIAGNOSIS — R1084 Generalized abdominal pain: Secondary | ICD-10-CM

## 2016-11-11 DIAGNOSIS — A419 Sepsis, unspecified organism: Secondary | ICD-10-CM | POA: Diagnosis present

## 2016-11-11 DIAGNOSIS — E669 Obesity, unspecified: Secondary | ICD-10-CM | POA: Diagnosis present

## 2016-11-11 DIAGNOSIS — J9611 Chronic respiratory failure with hypoxia: Secondary | ICD-10-CM | POA: Diagnosis present

## 2016-11-11 DIAGNOSIS — I480 Paroxysmal atrial fibrillation: Secondary | ICD-10-CM | POA: Diagnosis present

## 2016-11-11 DIAGNOSIS — E876 Hypokalemia: Secondary | ICD-10-CM | POA: Diagnosis present

## 2016-11-11 DIAGNOSIS — F17210 Nicotine dependence, cigarettes, uncomplicated: Secondary | ICD-10-CM | POA: Diagnosis present

## 2016-11-11 DIAGNOSIS — J440 Chronic obstructive pulmonary disease with acute lower respiratory infection: Secondary | ICD-10-CM | POA: Diagnosis present

## 2016-11-11 DIAGNOSIS — J810 Acute pulmonary edema: Secondary | ICD-10-CM

## 2016-11-11 DIAGNOSIS — M6281 Muscle weakness (generalized): Secondary | ICD-10-CM

## 2016-11-11 DIAGNOSIS — Z79899 Other long term (current) drug therapy: Secondary | ICD-10-CM | POA: Diagnosis not present

## 2016-11-11 DIAGNOSIS — J181 Lobar pneumonia, unspecified organism: Secondary | ICD-10-CM

## 2016-11-11 DIAGNOSIS — Z8051 Family history of malignant neoplasm of kidney: Secondary | ICD-10-CM | POA: Diagnosis not present

## 2016-11-11 DIAGNOSIS — Z800 Family history of malignant neoplasm of digestive organs: Secondary | ICD-10-CM | POA: Diagnosis not present

## 2016-11-11 DIAGNOSIS — D751 Secondary polycythemia: Secondary | ICD-10-CM | POA: Diagnosis present

## 2016-11-11 DIAGNOSIS — J189 Pneumonia, unspecified organism: Secondary | ICD-10-CM | POA: Diagnosis present

## 2016-11-11 DIAGNOSIS — Z9981 Dependence on supplemental oxygen: Secondary | ICD-10-CM | POA: Diagnosis not present

## 2016-11-11 DIAGNOSIS — Z6841 Body Mass Index (BMI) 40.0 and over, adult: Secondary | ICD-10-CM | POA: Diagnosis not present

## 2016-11-11 DIAGNOSIS — Z8542 Personal history of malignant neoplasm of other parts of uterus: Secondary | ICD-10-CM

## 2016-11-11 DIAGNOSIS — R0602 Shortness of breath: Secondary | ICD-10-CM

## 2016-11-11 DIAGNOSIS — I4891 Unspecified atrial fibrillation: Secondary | ICD-10-CM | POA: Diagnosis present

## 2016-11-11 LAB — CBC
HEMATOCRIT: 41.4 % (ref 35.0–47.0)
Hemoglobin: 14.2 g/dL (ref 12.0–16.0)
MCH: 30.9 pg (ref 26.0–34.0)
MCHC: 34.3 g/dL (ref 32.0–36.0)
MCV: 90.3 fL (ref 80.0–100.0)
PLATELETS: 151 10*3/uL (ref 150–440)
RBC: 4.58 MIL/uL (ref 3.80–5.20)
RDW: 13.2 % (ref 11.5–14.5)
WBC: 16.6 10*3/uL — AB (ref 3.6–11.0)

## 2016-11-11 LAB — URINALYSIS COMPLETE WITH MICROSCOPIC (ARMC ONLY)
Bilirubin Urine: NEGATIVE
Glucose, UA: NEGATIVE mg/dL
LEUKOCYTES UA: NEGATIVE
NITRITE: NEGATIVE
PH: 5 (ref 5.0–8.0)
PROTEIN: NEGATIVE mg/dL
SPECIFIC GRAVITY, URINE: 1.008 (ref 1.005–1.030)

## 2016-11-11 LAB — LIPASE, BLOOD: Lipase: 13 U/L (ref 11–51)

## 2016-11-11 LAB — COMPREHENSIVE METABOLIC PANEL
ALBUMIN: 2.9 g/dL — AB (ref 3.5–5.0)
ALT: 14 U/L (ref 14–54)
AST: 20 U/L (ref 15–41)
Alkaline Phosphatase: 68 U/L (ref 38–126)
Anion gap: 11 (ref 5–15)
BILIRUBIN TOTAL: 1.4 mg/dL — AB (ref 0.3–1.2)
BUN: 16 mg/dL (ref 6–20)
CHLORIDE: 91 mmol/L — AB (ref 101–111)
CO2: 32 mmol/L (ref 22–32)
CREATININE: 0.51 mg/dL (ref 0.44–1.00)
Calcium: 8.6 mg/dL — ABNORMAL LOW (ref 8.9–10.3)
GFR calc Af Amer: >60 mL/min (ref >60)
GFR calc non Af Amer: >60 mL/min (ref >60)
GLUCOSE: 102 mg/dL — AB (ref 65–99)
POTASSIUM: 3.1 mmol/L — AB (ref 3.5–5.1)
Sodium: 134 mmol/L — ABNORMAL LOW (ref 135–145)
Total Protein: 6.3 g/dL — ABNORMAL LOW (ref 6.5–8.1)

## 2016-11-11 LAB — GLUCOSE, CAPILLARY: GLUCOSE-CAPILLARY: 185 mg/dL — AB (ref 65–99)

## 2016-11-11 LAB — TROPONIN I
Troponin I: 0.06 ng/mL (ref <0.03)
Troponin I: 0.05 ng/mL (ref <0.03)

## 2016-11-11 MED ORDER — INSULIN ASPART 100 UNIT/ML ~~LOC~~ SOLN
0.0000 [IU] | Freq: Every day | SUBCUTANEOUS | Status: DC
  Administered 2016-11-14: 2 [IU] via SUBCUTANEOUS
  Filled 2016-11-11: qty 2

## 2016-11-11 MED ORDER — ONDANSETRON HCL 4 MG/2ML IJ SOLN
4.0000 mg | Freq: Once | INTRAMUSCULAR | Status: AC
  Administered 2016-11-11: 4 mg via INTRAVENOUS
  Filled 2016-11-11: qty 2

## 2016-11-11 MED ORDER — LEVOFLOXACIN IN D5W 750 MG/150ML IV SOLN
750.0000 mg | Freq: Once | INTRAVENOUS | Status: AC
  Administered 2016-11-11 (×2): 750 mg via INTRAVENOUS
  Filled 2016-11-11: qty 150

## 2016-11-11 MED ORDER — DILTIAZEM HCL 25 MG/5ML IV SOLN
10.0000 mg | Freq: Once | INTRAVENOUS | Status: AC
  Administered 2016-11-11: 10 mg via INTRAVENOUS

## 2016-11-11 MED ORDER — DILTIAZEM HCL 30 MG PO TABS
30.0000 mg | ORAL_TABLET | Freq: Four times a day (QID) | ORAL | Status: DC
  Administered 2016-11-11 – 2016-11-12 (×2): 30 mg via ORAL
  Filled 2016-11-11 (×2): qty 1

## 2016-11-11 MED ORDER — METHYLPREDNISOLONE SODIUM SUCC 125 MG IJ SOLR
60.0000 mg | Freq: Four times a day (QID) | INTRAMUSCULAR | Status: DC
  Administered 2016-11-11 – 2016-11-12 (×4): 60 mg via INTRAVENOUS
  Filled 2016-11-11 (×3): qty 2

## 2016-11-11 MED ORDER — POLYETHYLENE GLYCOL 3350 17 G PO PACK
17.0000 g | PACK | Freq: Every day | ORAL | Status: DC | PRN
  Administered 2016-11-13 – 2016-11-14 (×2): 17 g via ORAL
  Filled 2016-11-11 (×2): qty 1

## 2016-11-11 MED ORDER — DILTIAZEM HCL 100 MG IV SOLR
5.0000 mg/h | Freq: Once | INTRAVENOUS | Status: AC
  Administered 2016-11-11: 5 mg/h via INTRAVENOUS
  Filled 2016-11-11: qty 100

## 2016-11-11 MED ORDER — DOCUSATE SODIUM 100 MG PO CAPS
100.0000 mg | ORAL_CAPSULE | Freq: Every day | ORAL | Status: DC | PRN
  Administered 2016-11-12 – 2016-11-14 (×2): 100 mg via ORAL
  Filled 2016-11-11 (×2): qty 1

## 2016-11-11 MED ORDER — IPRATROPIUM-ALBUTEROL 0.5-2.5 (3) MG/3ML IN SOLN
3.0000 mL | Freq: Once | RESPIRATORY_TRACT | Status: AC
  Administered 2016-11-11: 3 mL via RESPIRATORY_TRACT
  Filled 2016-11-11: qty 3

## 2016-11-11 MED ORDER — ONDANSETRON HCL 4 MG/2ML IJ SOLN: 4.0000 mg | Freq: Four times a day (QID) | INTRAMUSCULAR | Status: DC | PRN

## 2016-11-11 MED ORDER — PANTOPRAZOLE SODIUM 40 MG IV SOLR
40.0000 mg | INTRAVENOUS | Status: DC
  Administered 2016-11-11 – 2016-11-13 (×3): 40 mg via INTRAVENOUS
  Filled 2016-11-11 (×3): qty 40

## 2016-11-11 MED ORDER — APIXABAN 5 MG PO TABS
5.0000 mg | ORAL_TABLET | Freq: Two times a day (BID) | ORAL | Status: DC
  Administered 2016-11-11 – 2016-11-15 (×8): 5 mg via ORAL
  Filled 2016-11-11 (×8): qty 1

## 2016-11-11 MED ORDER — MORPHINE SULFATE (PF) 4 MG/ML IV SOLN
4.0000 mg | Freq: Once | INTRAVENOUS | Status: AC
  Administered 2016-11-11: 4 mg via INTRAVENOUS
  Filled 2016-11-11: qty 1

## 2016-11-11 MED ORDER — FUROSEMIDE 10 MG/ML IJ SOLN
20.0000 mg | Freq: Two times a day (BID) | INTRAMUSCULAR | Status: DC
  Administered 2016-11-11 – 2016-11-14 (×6): 20 mg via INTRAVENOUS
  Filled 2016-11-11 (×7): qty 2

## 2016-11-11 MED ORDER — ACETAMINOPHEN 500 MG PO TABS: 500.0000 mg | ORAL_TABLET | Freq: Four times a day (QID) | ORAL | Status: DC | PRN

## 2016-11-11 MED ORDER — INSULIN ASPART 100 UNIT/ML ~~LOC~~ SOLN
0.0000 [IU] | Freq: Three times a day (TID) | SUBCUTANEOUS | Status: DC
  Administered 2016-11-12 (×3): 2 [IU] via SUBCUTANEOUS
  Administered 2016-11-13 (×2): 1 [IU] via SUBCUTANEOUS
  Administered 2016-11-13: 2 [IU] via SUBCUTANEOUS
  Administered 2016-11-14: 1 [IU] via SUBCUTANEOUS
  Administered 2016-11-14 – 2016-11-15 (×2): 2 [IU] via SUBCUTANEOUS
  Administered 2016-11-15: 1 [IU] via SUBCUTANEOUS
  Filled 2016-11-11 (×2): qty 2
  Filled 2016-11-11: qty 1
  Filled 2016-11-11 (×2): qty 2
  Filled 2016-11-11: qty 1
  Filled 2016-11-11: qty 2
  Filled 2016-11-11: qty 1
  Filled 2016-11-11: qty 2
  Filled 2016-11-11: qty 1

## 2016-11-11 MED ORDER — POTASSIUM CHLORIDE CRYS ER 20 MEQ PO TBCR
40.0000 meq | EXTENDED_RELEASE_TABLET | ORAL | Status: AC
Start: 2016-11-11 — End: 2016-11-12
  Administered 2016-11-11 – 2016-11-12 (×2): 40 meq via ORAL
  Filled 2016-11-11 (×2): qty 2

## 2016-11-11 MED ORDER — NICOTINE 21 MG/24HR TD PT24
21.0000 mg | MEDICATED_PATCH | Freq: Every day | TRANSDERMAL | Status: DC
  Administered 2016-11-11 – 2016-11-15 (×5): 21 mg via TRANSDERMAL
  Filled 2016-11-11 (×8): qty 1

## 2016-11-11 MED ORDER — FUROSEMIDE 10 MG/ML IJ SOLN
40.0000 mg | Freq: Once | INTRAMUSCULAR | Status: AC
  Administered 2016-11-11: 40 mg via INTRAVENOUS
  Filled 2016-11-11: qty 4

## 2016-11-11 MED ORDER — IPRATROPIUM-ALBUTEROL 0.5-2.5 (3) MG/3ML IN SOLN
3.0000 mL | Freq: Four times a day (QID) | RESPIRATORY_TRACT | Status: DC
  Administered 2016-11-12 – 2016-11-15 (×14): 3 mL via RESPIRATORY_TRACT
  Filled 2016-11-11 (×15): qty 3

## 2016-11-11 MED ORDER — DILTIAZEM HCL 25 MG/5ML IV SOLN
INTRAVENOUS | Status: AC
  Administered 2016-11-11: 10 mg via INTRAVENOUS
  Filled 2016-11-11: qty 5

## 2016-11-11 MED ORDER — MORPHINE SULFATE (PF) 4 MG/ML IV SOLN
2.0000 mg | INTRAVENOUS | Status: DC | PRN
  Administered 2016-11-11 – 2016-11-12 (×2): 2 mg via INTRAVENOUS
  Filled 2016-11-11 (×2): qty 1

## 2016-11-11 MED ORDER — LEVOFLOXACIN IN D5W 750 MG/150ML IV SOLN
750.0000 mg | INTRAVENOUS | Status: DC
  Filled 2016-11-11: qty 150

## 2016-11-11 MED ORDER — ALBUTEROL SULFATE (2.5 MG/3ML) 0.083% IN NEBU: 2.5000 mg | INHALATION_SOLUTION | RESPIRATORY_TRACT | Status: DC | PRN

## 2016-11-11 MED ORDER — METHYLPREDNISOLONE SODIUM SUCC 125 MG IJ SOLR
125.0000 mg | Freq: Once | INTRAMUSCULAR | Status: AC
  Administered 2016-11-11: 125 mg via INTRAVENOUS
  Filled 2016-11-11: qty 2

## 2016-11-11 MED ORDER — UMECLIDINIUM-VILANTEROL 62.5-25 MCG/INH IN AEPB
1.0000 | INHALATION_SPRAY | Freq: Every day | RESPIRATORY_TRACT | Status: DC
  Filled 2016-11-11: qty 14

## 2016-11-11 MED ORDER — ASPIRIN EC 81 MG PO TBEC
81.0000 mg | DELAYED_RELEASE_TABLET | Freq: Every day | ORAL | Status: DC
  Administered 2016-11-12 – 2016-11-15 (×4): 81 mg via ORAL
  Filled 2016-11-11 (×4): qty 1

## 2016-11-11 NOTE — ED Notes (Signed)
Pt placed on bedpan to obtain urine sample.

## 2016-11-11 NOTE — ED Provider Notes (Addendum)
[facility] Emergency Department Provider Note  Time seen: 3:45 PM  I have reviewed the triage vital signs and the nursing notes.   HISTORY  Chief Complaint Shortness of Breath and Abdominal Pain    HPI Jessica Dalton is a 65 y.o. female who presents the emergency department for cough, shortness of breath, and abdominal pain. According to the patient she has a history of abdominal pain, had a hysterectomy performed last month for the same states continued abdominal pain since. Denies any acute worsening of the abdominal pain. However beginning 3 days ago she has been feeling short of breath with a very frequent cough. States a history of COPD, wears 2 L of oxygen at home at baseline however today the patient is satting 82-84 percent on 2 L per EMS. Patient with frequent wet sounding cough, denies sputum production. Denies fever. States the coughing is making the abdominal pain hurt worse.  Past Medical History:  Diagnosis Date  . Anemia   . Asthma   . Cancer ([place])    uterine ca  . Endometrial cancer ([place])   . Gallstones 10/16/2016  . Obesity   . Tobacco use     Patient Active Problem List   Diagnosis Date Noted  . Erythrocytosis 11/08/2016  . Cigarette smoker   . Gallstones 10/16/2016  . Endometrial cancer determined by uterine biopsy ([place]) 10/16/2016  . Endometrial cancer ([place]) 09/18/2016    Past Surgical History:  Procedure Laterality Date  . CATARACT EXTRACTION, BILATERAL  2015  . PELVIC LYMPH NODE DISSECTION N/A 10/16/2016   Procedure: PELVIC LYMPH NODE DISSECTION;  Surgeon: Gillis Ends, MD;  Location: [facility] ORS;  Service: Gynecology;  Laterality: N/A;  . ROBOTIC ASSISTED TOTAL HYSTERECTOMY WITH BILATERAL SALPINGO OOPHERECTOMY Bilateral 10/16/2016   Procedure: ROBOTIC ASSISTED TOTAL HYSTERECTOMY WITH BILATERAL SALPINGO OOPHORECTOMY;  Surgeon: Gillis Ends, MD;  Location: [facility] ORS;  Service: Gynecology;  Laterality: Bilateral;  .  SENTINEL NODE BIOPSY N/A 10/16/2016   Procedure: SENTINEL NODE INJECTION;  Surgeon: Gillis Ends, MD;  Location: [facility] ORS;  Service: Gynecology;  Laterality: N/A;  . TUBAL LIGATION      Prior to Admission medications   Medication Sig Start Date End Date Taking? Authorizing Provider  acetaminophen (TYLENOL) 500 MG tablet Take 1,500 mg by mouth daily as needed for mild pain.    Historical Provider, MD  docusate sodium (COLACE) 100 MG capsule Take 1 capsule (100 mg total) by mouth daily as needed for mild constipation. 10/18/16   Benjaman Kindler, MD  ibuprofen (ADVIL,MOTRIN) 200 MG tablet Take 400 mg by mouth every 6 (six) hours as needed for headache or moderate pain.    Historical Provider, MD  nicotine (NICODERM CQ - DOSED IN MG/24 HOURS) 14 mg/24hr patch Place 1 patch (14 mg total) onto the skin daily. 11/07/16 11/07/17  Wilhelmina Mcardle, MD  nicotine (NICOTROL) 10 MG inhaler Inhale 1 Cartridge (1 continuous puffing total) into the lungs as needed for smoking cessation. Patient not taking: Reported on 11/08/2016 11/07/16   Wilhelmina Mcardle, MD  polyethylene glycol [facility] / Floria Raveling) packet Take 17 g by mouth daily as needed for mild constipation or moderate constipation.    Historical Provider, MD  umeclidinium-vilanterol (ANORO ELLIPTA) 62.5-25 MCG/INH AEPB Inhale 1 puff into the lungs daily. 11/07/16   Wilhelmina Mcardle, MD    No Known Allergies  Family History  Problem Relation Age of Onset  . Cancer Mother     renal cell cancer early  39's  . Cancer Father     colon cancer late 92's   . Cancer Sister     melanoma  age 65; cervical vs endometrial cancer age?    Social History Social History  Substance Use Topics  . Smoking status: Current Every Day Smoker    Packs/day: 0.50    Years: 50.00  . Smokeless tobacco: Never Used     Comment: 75 pk yr - 1.5 ppd; 24 years; started age 50  . Alcohol use No    Review of Systems Constitutional: Negative for  fever Cardiovascular: Negative for chest pain. Respiratory: Positive for shortness of breath. Positive for cough. Gastrointestinal: Moderate diffuse/lower abdominal pain, chronic per patient. Musculoskeletal: Negative for back pain. Neurological: Negative for headache 10-point ROS otherwise negative.  ____________________________________________   PHYSICAL EXAM:  Constitutional: Alert and oriented. Well appearing and in no distress. Eyes: Normal exam ENT   Head: Normocephalic and atraumatic.   Mouth/Throat: Mucous membranes are moist. Cardiovascular: Normal rate, regular rhythm. No murmur Respiratory: Diffuse expiratory wheeze, frequent wet sounding cough. No obvious rales or rhonchi. Gastrointestinal: Soft and nontender. No distention Musculoskeletal: Nontender with normal range of motion in all extremities.  Neurologic:  Normal speech and language. No gross focal neurologic deficits  Skin:  Skin is warm, dry and intact.  Psychiatric: Mood and affect are normal.   ____________________________________________    EKG  EKG reviewed and interpreted by myself appears to show atrial fibrillation with rapid ventricular response rate of 134 bpm, narrow QRS, normal axis, nonspecific ST changes without obvious ST elevation.  Repeat EKG reviewed and interpreted by myself shows atrial fibrillation at 125 bpm, narrow QRS, normal axis, largely normal intervals with nonspecific ST changes. No ST elevation. ____________________________________________    RADIOLOGY  Chest x-ray consistent with CHF with interstitial edema, possible left lower lobe pneumonia.  ____________________________________________   INITIAL IMPRESSION / ASSESSMENT AND PLAN / ED COURSE  Pertinent labs & imaging results that were available during my care of the patient were reviewed by me and considered in my medical decision making (see chart for details).  Patient presents the emergency department with  cough, shortness of breath for the past 3-4 days. Satting 82-84 percent on her normal 2 L per EMS. Patient with diffuse expiratory wheeze on exam, history of COPD. We'll treat with DuoNeb nebs and Solu-Medrol. Patient states chronic abdominal pain unchanged for the past several months but denies acute worsening. We will check labs including LFTs and lipase, urinalysis. We will obtain a chest x-ray, troponin, and closely monitor in the emergency department while awaiting lab x-ray and EKG results.  Chest x-ray shows CHF with interstitial edema possible left lower lobe pneumonia. Patient continues to be an atrial fibrillation with rapid ventricular response after multiple doses of diltiazem continues with a heart rate around 120. Atrial fibrillation appears to be new onset. We will start on a diltiazem drip, dose Lasix and admit to the hospital for further workup. Blood cultures have been sent and we will start the patient on IV Levaquin for the likely left lower lobe pneumonia. Troponin is slightly elevated.  CRITICAL CARE Performed by: Harvest Dark   Total critical care time: 45 minutes  Critical care time was exclusive of separately billable procedures and treating other patients.  Critical care was necessary to treat or prevent imminent or life-threatening deterioration.  Critical care was time spent personally by me on the following activities: development of treatment plan with patient and/or surrogate as well as nursing,  discussions with consultants, evaluation of patient's response to treatment, examination of patient, obtaining history from patient or surrogate, ordering and performing treatments and interventions, ordering and review of laboratory studies, ordering and review of radiographic studies, pulse oximetry and re-evaluation of patient's condition.   ____________________________________________   FINAL CLINICAL IMPRESSION(S) / ED DIAGNOSES  Dyspnea Abdominal pain COPD  exacerbation New-onset atrial fibrillation with rapid ventricular response   Harvest Dark, MD 11/11/16 1746    Harvest Dark, MD 11/11/16 2026

## 2016-11-11 NOTE — H&P (Signed)
[place] [facility] [place] NAME: Jessica Dalton    MR#:  [id number]  DATE OF BIRTH:  09/14/1951  DATE OF ADMISSION:  11/11/2016  PRIMARY CARE PHYSICIAN: Dion Body, MD   REQUESTING/REFERRING PHYSICIAN: Harvest Dark, MD  CHIEF COMPLAINT:   Shortness of breath and abdominal pain HISTORY OF PRESENT ILLNESS:  Jessica Dalton  is a 65 y.o. female with a known history of COPD, chronic hypoxic respiratory failure lives on 2 L of oxygen is presenting to the emergency department with cough and shortness of breath. Patient is also reporting generalized abdominal pain. Patient recently had laparoscopic hysterectomy performed on November 8. Patient is scheduled to get "cystectomy done on December 11. Patient has new onset atrial fibrillation in the emergency department she was given IV Cardizem with no significant improvement and Cardizem drip was started. Also she has palmar the congestion and possible pneumonia. Patient is a started on IV levofloxacin. Patient was hypoxic at 82% on 2 L as reported by EMS prior to her arrival to emergency department. Denies any nausea or vomiting. Denies any diarrhea. No fevers. Smokes  half pack to 1 pack a day  PAST MEDICAL HISTORY:   Past Medical History:  Diagnosis Date  . Anemia   . Asthma   . Cancer ([place])    uterine ca  . Endometrial cancer ([place])   . Gallstones 10/16/2016  . Obesity   . Tobacco use     PAST SURGICAL HISTOIRY:   Past Surgical History:  Procedure Laterality Date  . CATARACT EXTRACTION, BILATERAL  2015  . PELVIC LYMPH NODE DISSECTION N/A 10/16/2016   Procedure: PELVIC LYMPH NODE DISSECTION;  Surgeon: Gillis Ends, MD;  Location: [facility] ORS;  Service: Gynecology;  Laterality: N/A;  . ROBOTIC ASSISTED TOTAL HYSTERECTOMY WITH BILATERAL SALPINGO OOPHERECTOMY Bilateral 10/16/2016   Procedure: ROBOTIC ASSISTED TOTAL HYSTERECTOMY WITH BILATERAL SALPINGO OOPHORECTOMY;  Surgeon:  Gillis Ends, MD;  Location: [facility] ORS;  Service: Gynecology;  Laterality: Bilateral;  . SENTINEL NODE BIOPSY N/A 10/16/2016   Procedure: SENTINEL NODE INJECTION;  Surgeon: Gillis Ends, MD;  Location: [facility] ORS;  Service: Gynecology;  Laterality: N/A;  . TUBAL LIGATION      SOCIAL HISTORY:   Social History  Substance Use Topics  . Smoking status: Current Every Day Smoker    Packs/day: 0.50    Years: 50.00  . Smokeless tobacco: Never Used     Comment: 75 pk yr - 1.5 ppd; 4 years; started age 60  . Alcohol use No    FAMILY HISTORY:   Family History  Problem Relation Age of Onset  . Cancer Mother     renal cell cancer early 71's  . Cancer Father     colon cancer late 3's   . Cancer Sister     melanoma  age 31; cervical vs endometrial cancer age?    DRUG ALLERGIES:  No Known Allergies  REVIEW OF SYSTEMS:  CONSTITUTIONAL: No fever, fatigue or weakness.  EYES: No blurred or double vision.  EARS, NOSE, AND THROAT: No tinnitus or ear pain.  RESPIRATORY: reporting cough, shortness of breath, wheezing , deniesmoptysis.  CARDIOVASCULAR: No chest pain, orthopnea, edema.  reporting palpitations  GASTROINTESTINAL: No nausea, vomiting, diarrhea ; reporting generalized abdominal pain.  GENITOURINARY: No dysuria, hematuria.  ENDOCRINE: No polyuria, nocturia,  HEMATOLOGY: No anemia, easy bruising or bleeding SKIN: No rash or lesion. MUSCULOSKELETAL: No joint pain or arthritis.   NEUROLOGIC: No tingling, numbness, weakness.  PSYCHIATRY: No anxiety or depression.   MEDICATIONS AT HOME:   Prior to Admission medications   Medication Sig Start Date End Date Taking? Authorizing Provider  umeclidinium-vilanterol (ANORO ELLIPTA) 62.5-25 MCG/INH AEPB Inhale 1 puff into the lungs daily. 11/07/16  Yes Wilhelmina Mcardle, MD  acetaminophen (TYLENOL) 500 MG tablet Take 1,500 mg by mouth daily as needed for mild pain.    Historical Provider, MD  docusate sodium (COLACE) 100 MG  capsule Take 1 capsule (100 mg total) by mouth daily as needed for mild constipation. Patient not taking: Reported on 11/11/2016 10/18/16   Benjaman Kindler, MD  ibuprofen (ADVIL,MOTRIN) 200 MG tablet Take 400 mg by mouth every 6 (six) hours as needed for headache or moderate pain.    Historical Provider, MD  nicotine (NICODERM CQ - DOSED IN MG/24 HOURS) 14 mg/24hr patch Place 1 patch (14 mg total) onto the skin daily. 11/07/16 11/07/17  Wilhelmina Mcardle, MD  nicotine (NICOTROL) 10 MG inhaler Inhale 1 Cartridge (1 continuous puffing total) into the lungs as needed for smoking cessation. Patient not taking: Reported on 11/08/2016 11/07/16   Wilhelmina Mcardle, MD  polyethylene glycol [facility] / Floria Raveling) packet Take 17 g by mouth daily as needed for mild constipation or moderate constipation.    Historical Provider, MD      VITAL SIGNS:  Blood pressure (!) 119/49, pulse (!) 118, temperature 99.2 F (37.3 C), temperature source Oral, resp. rate 20, height 5\' 1"  (1.549 m), weight 103.9 kg (229 lb), SpO2 93 %.  PHYSICAL EXAMINATION:  GENERAL:  65 y.o.-year-old patient lying in the bed with no acute distress.  EYES: Pupils equal, round, reactive to light and accommodation. No scleral icterus. Extraocular muscles intact.  HEENT: Head atraumatic, normocephalic. Oropharynx and nasopharynx clear.  NECK:  Supple, no jugular venous distention. No thyroid enlargement, no tenderness.  LUNGS: Normal breath sounds bilaterally, no wheezing, rales,rhonchi or crepitation. No use of accessory muscles of respiration.  CARDIOVASCULAR: S1, S2 normal. No murmurs, rubs, or gallops.  ABDOMEN: Soft, nontender, nondistended. Bowel sounds present. No organomegaly or mass.  EXTREMITIES: No pedal edema, cyanosis, or clubbing.  NEUROLOGIC: Cranial nerves II through XII are intact. Muscle strength 5/5 in all extremities. Sensation intact. Gait not checked.  PSYCHIATRIC: The patient is alert and oriented x 3.  SKIN: No obvious  rash, lesion, or ulcer.   LABORATORY PANEL:   CBC  Recent Labs Lab 11/11/16 1607  WBC 16.6*  HGB 14.2  HCT 41.4  PLT 151   ------------------------------------------------------------------------------------------------------------------  Chemistries   Recent Labs Lab 11/11/16 1607  NA 134*  K 3.1*  CL 91*  CO2 32  GLUCOSE 102*  BUN 16  CREATININE 0.51  CALCIUM 8.6*  AST 20  ALT 14  ALKPHOS 68  BILITOT 1.4*   ------------------------------------------------------------------------------------------------------------------  Cardiac Enzymes  Recent Labs Lab 11/11/16 1607  TROPONINI 0.06*   ------------------------------------------------------------------------------------------------------------------  RADIOLOGY:  Dg Chest 1 View  Result Date: 11/11/2016 CLINICAL DATA:  Two days of shortness of breath. Two weeks of abdominal pain. Currently has a productive cough. Long history of smoking. EXAM: CHEST 1 VIEW COMPARISON:  Portable chest x-ray of October 16, 2016 FINDINGS: The lungs remain hyperinflated. The interstitial markings are increased bilaterally. The cardiac silhouette is further enlarged. The retrocardiac density on the left has increased. The pulmonary vascularity is engorged. There is calcification in the wall of the aortic arch. IMPRESSION: CHF with mild interstitial edema more conspicuous than on the previous study. New left lower lobe atelectasis  or pneumonia and small left pleural effusion. Followup PA and lateral chest X-ray is recommended in 3-4 weeks following trial of antibiotic therapy to ensure resolution and exclude underlying malignancy. Electronically Signed   By: David  Martinique M.D.   On: 11/11/2016 16:32    EKG:   Orders placed or performed during the hospital encounter of 11/11/16  . ED EKG  . ED EKG    IMPRESSION AND PLAN:   Jessica Dalton  is a 65 y.o. female with a known history of COPD, chronic hypoxic respiratory failure lives  on 2 L of oxygen is presenting to the emergency department with cough and shortness of breath. Patient is also reporting generalized abdominal pain. Patient recently had laparoscopic hysterectomy performed on November 8. Patient is scheduled to get "cystectomy done on December 11. Patient has new onset atrial fibrillation in the emergency department she was given IV Cardizem with no significant improvement and Cardizem drip was started.   # New onset atrial fibrillation with RVR   admit patient to stepdown unit   Cardizem drip Started patient on Cardizem by mouth [place] score is 4-patient is female age 2, congestive heart failure and hypertension. Starting patient on now will oral anticoagulants Eliquis twice a day Cardiac consult is placed-[facility] cardiology Will obtain echocardiogram cardiac biomarkers   #New-onset congestive heart failure Telemetry monitoring, echocardiogram and Lasix 20 g IV every 12 hours Aspirin 81 mg, check fasting lipid panel and beta blocker if blood pressure is reasonable  #Pneumonia - community-acquired Sputum culture and sensitivity if we can get a sample Blood cultures were obtained in the emergency department Levofloxacin  #COPD exacerbation Solu-Medrol, nebulizer treatments and on Levaquin   #Hypokalemia replete potassium and check BMP in a.m.  #Generalized abdominal pain with recent history of laparoscopic hysterectomy Laparoscopy scars are healing well Pain management as needed Will obtain abdominal ultrasound  patient is scheduled to get the cholecystectomy on December 11 #Tobacco abuse disorder counseled patient to quit smoking for 3-5 minutes. We will start the patient on nicotine patch. Patient is agreeable.   GI prophylaxis with Protonix IV.   All the records are reviewed and case discussed with ED provider. Management plans discussed with the patient, family and they are in agreement.  CODE STATUS: fc,Husband is the healthcare power of  attorney  TOTAL CRITICAL CARE TIME TAKING CARE OF THIS PATIENT: 45 minutes.   Note: This dictation was prepared with Dragon dictation along with smaller phrase technology. Any transcriptional errors that result from this process are unintentional.  Nicholes Mango M.D on 11/11/2016 at 6:41 PM  Between 7am to 6pm - Pager - [phone]  After 6pm go to [email or web address] - password [email or web address] [place] [facility]  Office  [phone]  CC: Primary care physician; Dion Body, MD

## 2016-11-11 NOTE — Progress Notes (Signed)
Pharmacy Antibiotic Note  Pricilla Borowy is a 65 y.o. female admitted on 11/11/2016 with pneumonia.  Pharmacy has been consulted for levofloxacin dosing.  Plan: Ordered levofloxacin 750mg  IV Q24h for 5 days.    Height: 5\' 1"  (154.9 cm) Weight: 229 lb (103.9 kg) IBW/kg (Calculated) : 47.8  Temp (24hrs), Avg:99.2 F (37.3 C), Min:99.2 F (37.3 C), Max:99.2 F (37.3 C)   Recent Labs Lab 11/08/16 1221 11/11/16 1607  WBC 8.0 16.6*  CREATININE 0.56 0.51    Estimated Creatinine Clearance: 76.7 mL/min (by C-G formula based on SCr of 0.51 mg/dL).    No Known Allergies  Antimicrobials this admission: Levofloxacin 12/4 >>   Dose adjustments this admission: N/A  Microbiology results: 12/4 BCx: pending  Thank you for allowing pharmacy to be a part of this patient's care.  Olivia Canter, [place] Clinical Pharmacist 11/11/2016 7:38 PM

## 2016-11-11 NOTE — ED Triage Notes (Signed)
SOB x 2 days, ongoing abd pain x 2 weeks. Wet cough on arrival

## 2016-11-12 ENCOUNTER — Inpatient Hospital Stay: Payer: Medicare HMO

## 2016-11-12 ENCOUNTER — Inpatient Hospital Stay
Admit: 2016-11-12 | Discharge: 2016-11-12 | Disposition: A | Discharge status: Home / Self Care | Payer: Medicare HMO | Attending: Internal Medicine | Admitting: Internal Medicine

## 2016-11-12 LAB — BASIC METABOLIC PANEL
Anion gap: 9 (ref 5–15)
BUN: 19 mg/dL (ref 6–20)
CHLORIDE: 96 mmol/L — AB (ref 101–111)
CO2: 31 mmol/L (ref 22–32)
Calcium: 8.3 mg/dL — ABNORMAL LOW (ref 8.9–10.3)
Creatinine, Ser: 0.53 mg/dL (ref 0.44–1.00)
GFR calc Af Amer: >60 mL/min (ref >60)
GLUCOSE: 155 mg/dL — AB (ref 65–99)
POTASSIUM: 2.8 mmol/L — AB (ref 3.5–5.1)
Sodium: 136 mmol/L (ref 135–145)

## 2016-11-12 LAB — LIPID PANEL
Cholesterol: 145 mg/dL (ref 0–200)
HDL: 12 mg/dL — ABNORMAL LOW (ref >40)
LDL CALC: 93 mg/dL (ref 0–99)
Total CHOL/HDL Ratio: 12.1 RATIO
Triglycerides: 198 mg/dL — ABNORMAL HIGH (ref <150)
VLDL: 40 mg/dL (ref 0–40)

## 2016-11-12 LAB — EXPECTORATED SPUTUM ASSESSMENT W REFEX TO RESP CULTURE

## 2016-11-12 LAB — TSH: TSH: 0.188 u[IU]/mL — AB (ref 0.350–4.500)

## 2016-11-12 LAB — EXPECTORATED SPUTUM ASSESSMENT W GRAM STAIN, RFLX TO RESP C

## 2016-11-12 LAB — GLUCOSE, CAPILLARY
GLUCOSE-CAPILLARY: 180 mg/dL — AB (ref 65–99)
GLUCOSE-CAPILLARY: 168 mg/dL — AB (ref 65–99)
Glucose-Capillary: 152 mg/dL — ABNORMAL HIGH (ref 65–99)
Glucose-Capillary: 168 mg/dL — ABNORMAL HIGH (ref 65–99)

## 2016-11-12 LAB — BLOOD CULTURE ID PANEL (REFLEXED)
Acinetobacter baumannii: NOT DETECTED
CANDIDA PARAPSILOSIS: NOT DETECTED
Candida albicans: NOT DETECTED
Candida glabrata: NOT DETECTED
Candida krusei: NOT DETECTED
Candida tropicalis: NOT DETECTED
ENTEROCOCCUS SPECIES: NOT DETECTED
Enterobacter cloacae complex: NOT DETECTED
Enterobacteriaceae species: NOT DETECTED
Escherichia coli: NOT DETECTED
HAEMOPHILUS INFLUENZAE: NOT DETECTED
Klebsiella oxytoca: NOT DETECTED
Klebsiella pneumoniae: NOT DETECTED
LISTERIA MONOCYTOGENES: NOT DETECTED
Neisseria meningitidis: NOT DETECTED
PSEUDOMONAS AERUGINOSA: NOT DETECTED
Proteus species: NOT DETECTED
SERRATIA MARCESCENS: NOT DETECTED
STAPHYLOCOCCUS AUREUS BCID: NOT DETECTED
STREPTOCOCCUS PNEUMONIAE: DETECTED — AB
STREPTOCOCCUS PYOGENES: NOT DETECTED
STREPTOCOCCUS SPECIES: DETECTED — AB
Staphylococcus species: NOT DETECTED
Streptococcus agalactiae: NOT DETECTED

## 2016-11-12 LAB — CBC
HEMATOCRIT: 37.8 % (ref 35.0–47.0)
Hemoglobin: 13.2 g/dL (ref 12.0–16.0)
MCH: 31.1 pg (ref 26.0–34.0)
MCHC: 34.9 g/dL (ref 32.0–36.0)
MCV: 89.1 fL (ref 80.0–100.0)
Platelets: 131 10*3/uL — ABNORMAL LOW (ref 150–440)
RBC: 4.24 MIL/uL (ref 3.80–5.20)
RDW: 12.9 % (ref 11.5–14.5)
WBC: 12 10*3/uL — ABNORMAL HIGH (ref 3.6–11.0)

## 2016-11-12 LAB — JAK2 GENOTYPR

## 2016-11-12 LAB — POTASSIUM: Potassium: 3.2 mmol/L — ABNORMAL LOW (ref 3.5–5.1)

## 2016-11-12 LAB — ECHOCARDIOGRAM COMPLETE
HEIGHTINCHES: 61 in
WEIGHTICAEL: 3520 [oz_av]

## 2016-11-12 LAB — BRAIN NATRIURETIC PEPTIDE: B NATRIURETIC PEPTIDE 5: 266 pg/mL — AB (ref 0.0–100.0)

## 2016-11-12 LAB — TROPONIN I: Troponin I: <0.03 ng/mL (ref <0.03)

## 2016-11-12 LAB — MAGNESIUM: Magnesium: 1.9 mg/dL (ref 1.7–2.4)

## 2016-11-12 MED ORDER — CEFTRIAXONE SODIUM 2 G IJ SOLR: 2.0000 g | INTRAMUSCULAR | Status: DC

## 2016-11-12 MED ORDER — POTASSIUM CHLORIDE CRYS ER 20 MEQ PO TBCR
40.0000 meq | EXTENDED_RELEASE_TABLET | Freq: Once | ORAL | Status: AC
  Administered 2016-11-12: 40 meq via ORAL
  Filled 2016-11-12: qty 2

## 2016-11-12 MED ORDER — CEFTRIAXONE SODIUM-DEXTROSE 2-2.22 GM-% IV SOLR
2.0000 g | INTRAVENOUS | Status: DC
  Administered 2016-11-12 – 2016-11-15 (×4): 2 g via INTRAVENOUS
  Filled 2016-11-12 (×4): qty 50

## 2016-11-12 MED ORDER — TRAMADOL HCL 50 MG PO TABS
50.0000 mg | ORAL_TABLET | Freq: Four times a day (QID) | ORAL | Status: DC | PRN
  Administered 2016-11-12 – 2016-11-15 (×5): 50 mg via ORAL
  Filled 2016-11-12 (×5): qty 1

## 2016-11-12 MED ORDER — SODIUM CHLORIDE 0.9% FLUSH
3.0000 mL | INTRAVENOUS | Status: DC | PRN
  Administered 2016-11-13 – 2016-11-14 (×2): 3 mL via INTRAVENOUS
  Filled 2016-11-12 (×2): qty 3

## 2016-11-12 MED ORDER — SODIUM CHLORIDE 0.9% FLUSH
3.0000 mL | Freq: Two times a day (BID) | INTRAVENOUS | Status: DC
  Administered 2016-11-12: 3 mL via INTRAVENOUS
  Administered 2016-11-13: 10 mL via INTRAVENOUS
  Administered 2016-11-13 – 2016-11-15 (×4): 3 mL via INTRAVENOUS

## 2016-11-12 MED ORDER — DILTIAZEM HCL ER COATED BEADS 120 MG PO CP24
120.0000 mg | ORAL_CAPSULE | Freq: Every day | ORAL | Status: DC
  Administered 2016-11-12 – 2016-11-15 (×4): 120 mg via ORAL
  Filled 2016-11-12 (×4): qty 1

## 2016-11-12 MED ORDER — METHYLPREDNISOLONE SODIUM SUCC 125 MG IJ SOLR
60.0000 mg | Freq: Two times a day (BID) | INTRAMUSCULAR | Status: DC
  Administered 2016-11-13 – 2016-11-14 (×4): 60 mg via INTRAVENOUS
  Filled 2016-11-12 (×4): qty 2

## 2016-11-12 NOTE — Progress Notes (Signed)
Arrival Method: via stretcher with ED RN  Mental Orientation:?A&O Telemetry:?[id number], verified by Kristine Garbe, RN Skin: Surgical lap sites notes on addomen, Verified by Kristine Garbe , RN IV:20 G R FA and 20 G L AC Pain: Generalized abdominal pain described as cramping, scored 9/10. Hospitalists notified Tubes:?cardizem gtt @10 , verified by Jarome Lamas, RN Safety Measures: Safety Fall Prevention Plan has been given, discussed &?signed, non skid socks in place, patient?has been orientated to the room, unit &?staff.  Family:?Has been informed of plan of care. Orders have been reviewed &?implemented. Will continue to monitor the patient. Call light has been placed within reach. [place] RN

## 2016-11-12 NOTE — Care Management (Addendum)
patient with recent hysterectomy approximately one month ago and still having post op pain.  She is in the process of scheduling gallbladder surgery.  She has chronic home 02 through [facility].  She is agreeable to a referral to the heart failure clinic but at present, she is not able to sit for prolong periods of time due to pain. She and her husband do not feel she would qualify as home bound for home health services.  She has access to scales for daily weights. Patient will be provided with heart Failure Education folder.

## 2016-11-12 NOTE — Progress Notes (Signed)
*  PRELIMINARY RESULTS* Echocardiogram 2D Echocardiogram has been performed.  Jessica Dalton 11/12/2016, 9:10 AM

## 2016-11-12 NOTE — Consult Note (Signed)
Reason for Consult: Chest pain atrial fibrillation shortness of breath Referring Physician: Dr. Linthavong primary, Dr. Gouru hospitalist  Jessica Dalton is an 65 y.o. female.  HPI: 65-year-old obese white female with end-stage COPD hypoxemia on 2 L home O2 present with chest pain abdominal pain and cough congestion. Patient complains of shortness of breath and abdominal discomfort she was found to be in rapid atrial fibrillation which is new and placed on Cardizem patient's was hypoxic at 82% on 2 L. Patient still smokes. Denies any previous cardiac history. Now here for follow-up evaluation with minimal palpitations mild significant chest pain.  Past Medical History:  Diagnosis Date  . Anemia   . Asthma   . Cancer (HCC)    uterine ca  . Endometrial cancer (HCC)   . Gallstones 10/16/2016  . Obesity   . Tobacco use     Past Surgical History:  Procedure Laterality Date  . CATARACT EXTRACTION, BILATERAL  2015  . PELVIC LYMPH NODE DISSECTION N/A 10/16/2016   Procedure: PELVIC LYMPH NODE DISSECTION;  Surgeon: Angeles Alvarez Secord, MD;  Location: [facility];  Service: Gynecology;  Laterality: N/A;  . ROBOTIC ASSISTED TOTAL HYSTERECTOMY WITH BILATERAL SALPINGO OOPHERECTOMY Bilateral 10/16/2016   Procedure: ROBOTIC ASSISTED TOTAL HYSTERECTOMY WITH BILATERAL SALPINGO OOPHORECTOMY;  Surgeon: Angeles Alvarez Secord, MD;  Location: [facility];  Service: Gynecology;  Laterality: Bilateral;  . SENTINEL NODE BIOPSY N/A 10/16/2016   Procedure: SENTINEL NODE INJECTION;  Surgeon: Angeles Alvarez Secord, MD;  Location: [facility];  Service: Gynecology;  Laterality: N/A;  . TUBAL LIGATION      Family History  Problem Relation Age of Onset  . Cancer Mother     renal cell cancer early 70's  . Cancer Father     colon cancer late 60's   . Cancer Sister     melanoma  age 62; cervical vs endometrial cancer age?    Social History:  reports that she has been smoking.  She has a 25.00 pack-year smoking history.  She has never used smokeless tobacco. She reports that she does not drink alcohol or use drugs.  Allergies: No Known Allergies  Medications: I have reviewed the patient's current medications.  Results for orders placed or performed during the hospital encounter of 11/11/16 (from the past 48 hour(s))  CBC     Status: Abnormal   Collection Time: 11/11/16  4:07 PM  Result Value Ref Range   WBC 16.6 (H) 3.6 - 11.0 K/uL   RBC 4.58 3.80 - 5.20 MIL/uL   Hemoglobin 14.2 12.0 - 16.0 g/dL   HCT 41.4 35.0 - 47.0 %   MCV 90.3 80.0 - 100.0 fL   MCH 30.9 26.0 - 34.0 pg   MCHC 34.3 32.0 - 36.0 g/dL   RDW 13.2 11.5 - 14.5 %   Platelets 151 150 - 440 K/uL  Comprehensive metabolic panel     Status: Abnormal   Collection Time: 11/11/16  4:07 PM  Result Value Ref Range   Sodium 134 (L) 135 - 145 mmol/L   Potassium 3.1 (L) 3.5 - 5.1 mmol/L    Comment: HEMOLYSIS AT THIS LEVEL MAY AFFECT RESULT   Chloride 91 (L) 101 - 111 mmol/L   CO2 32 22 - 32 mmol/L   Glucose, Bld 102 (H) 65 - 99 mg/dL   BUN 16 6 - 20 mg/dL   Creatinine, Ser 0.51 0.44 - 1.00 mg/dL   Calcium 8.6 (L) 8.9 - 10.3 mg/dL   Total Protein 6.3 (L) 6.5 - 8.1   g/dL   Albumin 2.9 (L) 3.5 - 5.0 g/dL   AST 20 15 - 41 U/L   ALT 14 14 - 54 U/L   Alkaline Phosphatase 68 38 - 126 U/L   Total Bilirubin 1.4 (H) 0.3 - 1.2 mg/dL   GFR calc non Af Amer >60 >60 mL/min   GFR calc Af Amer >60 >60 mL/min    Comment: (NOTE) The eGFR has been calculated using the CKD EPI equation. This calculation has not been validated in all clinical situations. eGFR's persistently <60 mL/min signify possible Chronic Kidney Disease.    Anion gap 11 5 - 15  Troponin I     Status: Abnormal   Collection Time: 11/11/16  4:07 PM  Result Value Ref Range   Troponin I 0.06 (HH) <0.03 ng/mL    Comment: CRITICAL RESULT CALLED TO, READ BACK BY AND VERIFIED WITH KIM GAULT AT 1731 11/11/2016 BY TFK.   Lipase, blood     Status: None   Collection Time: 11/11/16  4:07 PM   Result Value Ref Range   Lipase 13 11 - 51 U/L  Blood culture (routine x 2)     Status: None (Preliminary result)   Collection Time: 11/11/16  4:48 PM  Result Value Ref Range   Specimen Description      BLOOD BLOOD RIGHT FOREARM Performed at [facility]    Special Requests BOTTLES DRAWN AEROBIC AND ANAEROBIC [id number]    Culture  Setup Time      GRAM POSITIVE COCCI IN BOTH AEROBIC AND ANAEROBIC BOTTLES CRITICAL RESULT CALLED TO, READ BACK BY AND VERIFIED WITH: KAREN HAYES 11/12/16 0806 SGD    Culture GRAM POSITIVE COCCI    Report Status PENDING   Blood culture (routine x 2)     Status: None (Preliminary result)   Collection Time: 11/11/16  4:53 PM  Result Value Ref Range   Specimen Description      BLOOD LEFT ANTECUBITAL Performed at [facility]    Special Requests      BOTTLES DRAWN AEROBIC AND ANAEROBIC [id number]   Culture  Setup Time      Organism ID to follow GRAM POSITIVE COCCI IN BOTH AEROBIC AND ANAEROBIC BOTTLES CRITICAL RESULT CALLED TO, READ BACK BY AND VERIFIED WITH: KAREN HAYES 11/12/16 0806 SGD    Culture GRAM POSITIVE COCCI    Report Status PENDING   Blood Culture ID Panel (Reflexed)     Status: Abnormal   Collection Time: 11/11/16  4:53 PM  Result Value Ref Range   Enterococcus species NOT DETECTED NOT DETECTED   Listeria monocytogenes NOT DETECTED NOT DETECTED   Staphylococcus species NOT DETECTED NOT DETECTED   Staphylococcus aureus NOT DETECTED NOT DETECTED   Streptococcus species DETECTED (A) NOT DETECTED    Comment: CRITICAL RESULT CALLED TO, READ BACK BY AND VERIFIED WITH: KAREN HAYES 11/12/16 0806 SGD    Streptococcus agalactiae NOT DETECTED NOT DETECTED   Streptococcus pneumoniae DETECTED (A) NOT DETECTED    Comment: CRITICAL RESULT CALLED TO, READ BACK BY AND VERIFIED WITH: KAREN HAYES 11/12/16 0806 SGD    Streptococcus pyogenes NOT DETECTED NOT DETECTED   Acinetobacter baumannii NOT DETECTED NOT DETECTED    Enterobacteriaceae species NOT DETECTED NOT DETECTED   Enterobacter cloacae complex NOT DETECTED NOT DETECTED   Escherichia coli NOT DETECTED NOT DETECTED   Klebsiella oxytoca NOT DETECTED NOT DETECTED   Klebsiella pneumoniae NOT DETECTED NOT DETECTED   Proteus species NOT DETECTED NOT DETECTED     Serratia marcescens NOT DETECTED NOT DETECTED   Haemophilus influenzae NOT DETECTED NOT DETECTED   Neisseria meningitidis NOT DETECTED NOT DETECTED   Pseudomonas aeruginosa NOT DETECTED NOT DETECTED   Candida albicans NOT DETECTED NOT DETECTED   Candida glabrata NOT DETECTED NOT DETECTED   Candida krusei NOT DETECTED NOT DETECTED   Candida parapsilosis NOT DETECTED NOT DETECTED   Candida tropicalis NOT DETECTED NOT DETECTED  Urinalysis complete, with microscopic ([facility] only)     Status: Abnormal   Collection Time: 11/11/16  8:37 PM  Result Value Ref Range   Color, Urine YELLOW (A) YELLOW   APPearance CLEAR (A) CLEAR   Glucose, UA NEGATIVE NEGATIVE mg/dL   Bilirubin Urine NEGATIVE NEGATIVE   Ketones, ur TRACE (A) NEGATIVE mg/dL   Specific Gravity, Urine 1.008 1.005 - 1.030   Hgb urine dipstick 1+ (A) NEGATIVE   pH 5.0 5.0 - 8.0   Protein, ur NEGATIVE NEGATIVE mg/dL   Nitrite NEGATIVE NEGATIVE   Leukocytes, UA NEGATIVE NEGATIVE   RBC / HPF 0-5 0 - 5 RBC/hpf   WBC, UA 0-5 0 - 5 WBC/hpf   Bacteria, UA RARE (A) NONE SEEN   Squamous Epithelial / LPF 0-5 (A) NONE SEEN   Mucous PRESENT    Hyaline Casts, UA PRESENT   Glucose, capillary     Status: Abnormal   Collection Time: 11/11/16 10:30 PM  Result Value Ref Range   Glucose-Capillary 185 (H) 65 - 99 mg/dL  Troponin I     Status: Abnormal   Collection Time: 11/11/16 10:41 PM  Result Value Ref Range   Troponin I 0.05 (HH) <0.03 ng/mL    Comment: CRITICAL VALUE NOTED. VALUE IS CONSISTENT WITH PREVIOUSLY REPORTED/CALLED VALUE.MSS  TSH     Status: Abnormal   Collection Time: 11/12/16  4:53 AM  Result Value Ref Range   TSH 0.188 (L) 0.350 -  4.500 uIU/mL    Comment: Performed by a 3rd Generation assay with a functional sensitivity of <=0.01 uIU/mL.  Troponin I     Status: None   Collection Time: 11/12/16  4:53 AM  Result Value Ref Range   Troponin I <0.03 <0.03 ng/mL  Brain natriuretic peptide     Status: Abnormal   Collection Time: 11/12/16  4:53 AM  Result Value Ref Range   B Natriuretic Peptide 266.0 (H) 0.0 - 100.0 pg/mL  Basic metabolic panel     Status: Abnormal   Collection Time: 11/12/16  4:53 AM  Result Value Ref Range   Sodium 136 135 - 145 mmol/L   Potassium 2.8 (L) 3.5 - 5.1 mmol/L   Chloride 96 (L) 101 - 111 mmol/L   CO2 31 22 - 32 mmol/L   Glucose, Bld 155 (H) 65 - 99 mg/dL   BUN 19 6 - 20 mg/dL   Creatinine, Ser 0.53 0.44 - 1.00 mg/dL   Calcium 8.3 (L) 8.9 - 10.3 mg/dL   GFR calc non Af Amer >60 >60 mL/min   GFR calc Af Amer >60 >60 mL/min    Comment: (NOTE) The eGFR has been calculated using the CKD EPI equation. This calculation has not been validated in all clinical situations. eGFR's persistently <60 mL/min signify possible Chronic Kidney Disease.    Anion gap 9 5 - 15  CBC     Status: Abnormal   Collection Time: 11/12/16  4:53 AM  Result Value Ref Range   WBC 12.0 (H) 3.6 - 11.0 K/uL   RBC 4.24 3.80 - 5.20 MIL/uL     Hemoglobin 13.2 12.0 - 16.0 g/dL   HCT 37.8 35.0 - 47.0 %   MCV 89.1 80.0 - 100.0 fL   MCH 31.1 26.0 - 34.0 pg   MCHC 34.9 32.0 - 36.0 g/dL   RDW 12.9 11.5 - 14.5 %   Platelets 131 (L) 150 - 440 K/uL  Lipid panel     Status: Abnormal   Collection Time: 11/12/16  4:53 AM  Result Value Ref Range   Cholesterol 145 0 - 200 mg/dL   Triglycerides 198 (H) <150 mg/dL   HDL 12 (L) >40 mg/dL   Total CHOL/HDL Ratio 12.1 RATIO   VLDL 40 0 - 40 mg/dL   LDL Cholesterol 93 0 - 99 mg/dL    Comment:        Total Cholesterol/HDL:CHD Risk Coronary Heart Disease Risk Table                     Men   Women  1/2 Average Risk   3.4   3.3  Average Risk       5.0   4.4  2 X Average Risk   9.6    7.1  3 X Average Risk  23.4   11.0        Use the calculated Patient Ratio above and the CHD Risk Table to determine the patient's CHD Risk.        ATP III CLASSIFICATION (LDL):  <100     mg/dL   Optimal  100-129  mg/dL   Near or Above                    Optimal  130-159  mg/dL   Borderline  160-189  mg/dL   High  >190     mg/dL   Very High   Glucose, capillary     Status: Abnormal   Collection Time: 11/12/16  9:36 AM  Result Value Ref Range   Glucose-Capillary 152 (H) 65 - 99 mg/dL  Troponin I     Status: None   Collection Time: 11/12/16 10:33 AM  Result Value Ref Range   Troponin I <0.03 <0.03 ng/mL  Glucose, capillary     Status: Abnormal   Collection Time: 11/12/16 12:10 PM  Result Value Ref Range   Glucose-Capillary 180 (H) 65 - 99 mg/dL  Potassium     Status: Abnormal   Collection Time: 11/12/16  3:50 PM  Result Value Ref Range   Potassium 3.2 (L) 3.5 - 5.1 mmol/L  Magnesium     Status: None   Collection Time: 11/12/16  3:50 PM  Result Value Ref Range   Magnesium 1.9 1.7 - 2.4 mg/dL  Glucose, capillary     Status: Abnormal   Collection Time: 11/12/16  4:56 PM  Result Value Ref Range   Glucose-Capillary 168 (H) 65 - 99 mg/dL    Dg Chest 1 View  Result Date: 11/11/2016 CLINICAL DATA:  Two days of shortness of breath. Two weeks of abdominal pain. Currently has a productive cough. Long history of smoking. EXAM: CHEST 1 VIEW COMPARISON:  Portable chest x-ray of October 16, 2016 FINDINGS: The lungs remain hyperinflated. The interstitial markings are increased bilaterally. The cardiac silhouette is further enlarged. The retrocardiac density on the left has increased. The pulmonary vascularity is engorged. There is calcification in the wall of the aortic arch. IMPRESSION: CHF with mild interstitial edema more conspicuous than on the previous study. New left lower lobe atelectasis or pneumonia and small left   pleural effusion. Followup PA and lateral chest X-ray is  recommended in 3-4 weeks following trial of antibiotic therapy to ensure resolution and exclude underlying malignancy. Electronically Signed   By: David  Jordan M.D.   On: 11/11/2016 16:32   Us Abdomen Complete  Result Date: 11/12/2016 CLINICAL DATA:  Abdominal pain . EXAM: ABDOMEN ULTRASOUND COMPLETE COMPARISON:  CT 09/24/2016. FINDINGS: Gallbladder: Gallbladder is distended with sludge and gallstones. Largest gallstone measures 2.5 cm. Gallbladder wall thickness 2.2 mm. Negative Murphy sign Common bile duct: Diameter: 5.7 mm Liver: No focal lesion identified. Within normal limits in parenchymal echogenicity. IVC: No abnormality visualized. Pancreas: Visualized portion unremarkable. Spleen: Size and appearance within normal limits. Right Kidney: Length: 12.9 cm. Echogenicity within normal limits. No mass or hydronephrosis visualized. Left Kidney: Length: 13.6 cm. Echogenicity within normal limits. No mass or hydronephrosis visualized. Abdominal aorta: No aneurysm visualized. Other findings: None. IMPRESSION: 1. Gall bladder is distended with sludge and gallstones. No evidence cholecystitis. 2.  Exam is otherwise unremarkable. Electronically Signed   By: Thomas  Register   On: 11/12/2016 08:36    Review of Systems  Constitutional: Positive for malaise/fatigue.  HENT: Negative.   Eyes: Negative.   Respiratory: Positive for cough, sputum production and shortness of breath.   Cardiovascular: Positive for chest pain, palpitations, orthopnea, leg swelling and PND.  Gastrointestinal: Positive for abdominal pain.  Genitourinary: Negative.   Musculoskeletal: Negative.   Skin: Negative.   Neurological: Positive for weakness.  Endo/Heme/Allergies: Negative.   Psychiatric/Behavioral: Negative.    Blood pressure (!) 114/58, pulse 95, temperature 99.2 F (37.3 C), temperature source Oral, resp. rate 20, height 5' 1" (1.549 m), weight 99.8 kg (220 lb), SpO2 91 %. Physical Exam  Nursing note and vitals  reviewed. Constitutional: She is oriented to person, place, and time. She appears well-developed and well-nourished.  HENT:  Head: Normocephalic.  Eyes: Conjunctivae and EOM are normal. Pupils are equal, round, and reactive to light.  Neck: Normal range of motion. Neck supple.  Cardiovascular: Normal rate and normal pulses.  An irregularly irregular rhythm present. Frequent extrasystoles are present.  Murmur heard.  Systolic murmur is present with a grade of 2/6  Respiratory: She has decreased breath sounds in the right upper field, the right middle field, the right lower field, the left upper field, the left middle field and the left lower field. She has wheezes in the right upper field, the right middle field, the right lower field, the left upper field, the left middle field and the left lower field. She has rhonchi in the right upper field, the right middle field, the right lower field, the left upper field, the left middle field and the left lower field.  GI: There is tenderness.  Musculoskeletal: Normal range of motion.  Neurological: She is alert and oriented to person, place, and time. She has normal reflexes.  Skin: Skin is warm and dry.  Psychiatric: She has a normal mood and affect.    Assessment/Plan: Chest pain atypical Atrial fibrillation Pneumonia community-acquired COPD Hypoxemia Obesity Smoking Abdominal pain History of endometrial cancer Status post hysterectomy Hypokalemia Elevated white count . PLAN Agree with admit to telemetry follow-up EKGs Continue Cardizem therapy for rate control Consider long-term anticoagulation for A. fib Supplemental oxygen therapy for hypoxemia Broad-spectrum antibiotic therapy for pneumonia Device refrain from tobacco abuse is poor secondary prevention Obesity recommend weight loss exercise portion control Corrected electrolytes Continue GI and GYN evaluation for abdominal pain   Dwayne D Callwood 11/12/2016, 5:41 PM     

## 2016-11-12 NOTE — Progress Notes (Signed)
PHARMACY - PHYSICIAN COMMUNICATION CRITICAL VALUE ALERT - BLOOD CULTURE IDENTIFICATION (BCID)  Results for orders placed or performed during the hospital encounter of 11/11/16  Blood Culture ID Panel (Reflexed) (Collected: 11/11/2016  4:53 PM)  Result Value Ref Range   Enterococcus species NOT DETECTED NOT DETECTED   Listeria monocytogenes NOT DETECTED NOT DETECTED   Staphylococcus species NOT DETECTED NOT DETECTED   Staphylococcus aureus NOT DETECTED NOT DETECTED   Streptococcus species DETECTED (A) NOT DETECTED   Streptococcus agalactiae NOT DETECTED NOT DETECTED   Streptococcus pneumoniae DETECTED (A) NOT DETECTED   Streptococcus pyogenes NOT DETECTED NOT DETECTED   Acinetobacter baumannii NOT DETECTED NOT DETECTED   Enterobacteriaceae species NOT DETECTED NOT DETECTED   Enterobacter cloacae complex NOT DETECTED NOT DETECTED   Escherichia coli NOT DETECTED NOT DETECTED   Klebsiella oxytoca NOT DETECTED NOT DETECTED   Klebsiella pneumoniae NOT DETECTED NOT DETECTED   Proteus species NOT DETECTED NOT DETECTED   Serratia marcescens NOT DETECTED NOT DETECTED   Haemophilus influenzae NOT DETECTED NOT DETECTED   Neisseria meningitidis NOT DETECTED NOT DETECTED   Pseudomonas aeruginosa NOT DETECTED NOT DETECTED   Candida albicans NOT DETECTED NOT DETECTED   Candida glabrata NOT DETECTED NOT DETECTED   Candida krusei NOT DETECTED NOT DETECTED   Candida parapsilosis NOT DETECTED NOT DETECTED   Candida tropicalis NOT DETECTED NOT DETECTED    Name of physician (or Provider) Contacted: Dr.Kalisetti  Changes to prescribed antibiotics required: Patient currently on Levaquin for Pneumonia, now with + Bcx of Strep. Pneumoniae.  Recommended abx for Strep.pneumoniae Blood cx= Ceftriaxone 2 gm IV q24h.  MD will clinically assess patient and make decision concerning antibiotic change.  Aizik Reh A 11/12/2016  9:12 AM

## 2016-11-12 NOTE — Progress Notes (Signed)
[facility] [place] [facility] [place] NAME: Jessica Dalton    MR#:  [id number]  DATE OF BIRTH:  05/07/1951  SUBJECTIVE:  CHIEF COMPLAINT:   Chief Complaint  Patient presents with  . Shortness of Breath  . Abdominal Pain   - Admitted with cough and worsening breathing. Noted to be hypoxic on her home 2 L oxygen and currently on 4 L nasal cannula. -Outpatient cholecystectomy scheduled next week and is concerned about that. -Also new onset atrial fibrillation and started on eliquis.  REVIEW OF SYSTEMS:  Review of Systems  Constitutional: Positive for malaise/fatigue. Negative for chills and fever.  HENT: Positive for congestion. Negative for ear discharge, hearing loss and nosebleeds.   Eyes: Negative for blurred vision.  Respiratory: Positive for cough, shortness of breath and wheezing.   Cardiovascular: Negative for chest pain, palpitations and leg swelling.  Gastrointestinal: Positive for abdominal pain and nausea. Negative for constipation, diarrhea and vomiting.  Genitourinary: Negative for dysuria and urgency.  Musculoskeletal: Positive for myalgias.  Neurological: Negative for dizziness, sensory change, speech change, focal weakness, seizures and headaches.  Psychiatric/Behavioral: Negative for depression.    DRUG ALLERGIES:  No Known Allergies  VITALS:  Blood pressure (!) 114/58, pulse 95, temperature 99.2 F (37.3 C), temperature source Oral, resp. rate 20, height 5\' 1"  (1.549 m), weight 99.8 kg (220 lb), SpO2 91 %.  PHYSICAL EXAMINATION:  Physical Exam  GENERAL:  65 y.o.-year-old patient lying in the bed with no acute distress.  EYES: Pupils equal, round, reactive to light and accommodation. No scleral icterus. Extraocular muscles intact.  HEENT: Head atraumatic, normocephalic. Oropharynx and nasopharynx clear.  NECK:  Supple, no jugular venous distention. No thyroid enlargement, no tenderness.  LUNGS: Normal breath sounds bilaterally,  But has bibasilar rhonchi, worse on the right base. -no wheezing, rales or crepitation. No use of accessory muscles of respiration.  CARDIOVASCULAR: S1, S2 normal. No murmurs, rubs, or gallops.  ABDOMEN: Soft, nontender, nondistended. Bowel sounds present. No organomegaly or mass.  EXTREMITIES: No pedal edema, cyanosis, or clubbing.  NEUROLOGIC: Cranial nerves II through XII are intact. Muscle strength 5/5 in all extremities. Sensation intact. Gait not checked.  PSYCHIATRIC: The patient is alert and oriented x 3.  SKIN: No obvious rash, lesion, or ulcer.    LABORATORY PANEL:   CBC  Recent Labs Lab 11/12/16 0453  WBC 12.0*  HGB 13.2  HCT 37.8  PLT 131*   ------------------------------------------------------------------------------------------------------------------  Chemistries   Recent Labs Lab 11/11/16 1607 11/12/16 0453  NA 134* 136  K 3.1* 2.8*  CL 91* 96*  CO2 32 31  GLUCOSE 102* 155*  BUN 16 19  CREATININE 0.51 0.53  CALCIUM 8.6* 8.3*  AST 20  --   ALT 14  --   ALKPHOS 68  --   BILITOT 1.4*  --    ------------------------------------------------------------------------------------------------------------------  Cardiac Enzymes  Recent Labs Lab 11/12/16 1033  TROPONINI <0.03   ------------------------------------------------------------------------------------------------------------------  RADIOLOGY:  Dg Chest 1 View  Result Date: 11/11/2016 CLINICAL DATA:  Two days of shortness of breath. Two weeks of abdominal pain. Currently has a productive cough. Long history of smoking. EXAM: CHEST 1 VIEW COMPARISON:  Portable chest x-ray of October 16, 2016 FINDINGS: The lungs remain hyperinflated. The interstitial markings are increased bilaterally. The cardiac silhouette is further enlarged. The retrocardiac density on the left has increased. The pulmonary vascularity is engorged. There is calcification in the wall of the aortic arch. IMPRESSION: CHF with mild  interstitial edema more conspicuous than on the previous study. New left lower lobe atelectasis or pneumonia and small left pleural effusion. Followup PA and lateral chest X-ray is recommended in 3-4 weeks following trial of antibiotic therapy to ensure resolution and exclude underlying malignancy. Electronically Signed   By: David  Martinique M.D.   On: 11/11/2016 16:32   US Abdomen Complete  Result Date: 11/12/2016 CLINICAL DATA:  Abdominal pain . EXAM: ABDOMEN ULTRASOUND COMPLETE COMPARISON:  CT 09/24/2016. FINDINGS: Gallbladder: Gallbladder is distended with sludge and gallstones. Largest gallstone measures 2.5 cm. Gallbladder wall thickness 2.2 mm. Negative Murphy sign Common bile duct: Diameter: 5.7 mm Liver: No focal lesion identified. Within normal limits in parenchymal echogenicity. IVC: No abnormality visualized. Pancreas: Visualized portion unremarkable. Spleen: Size and appearance within normal limits. Right Kidney: Length: 12.9 cm. Echogenicity within normal limits. No mass or hydronephrosis visualized. Left Kidney: Length: 13.6 cm. Echogenicity within normal limits. No mass or hydronephrosis visualized. Abdominal aorta: No aneurysm visualized. Other findings: None. IMPRESSION: 1. Gall bladder is distended with sludge and gallstones. No evidence cholecystitis. 2.  Exam is otherwise unremarkable. Electronically Signed   By: Marcello Moores  Register   On: 11/12/2016 08:36    EKG:   Orders placed or performed during the hospital encounter of 11/11/16  . ED EKG  . ED EKG  . EKG 12-lead    ASSESSMENT AND PLAN:   65 y/o F with past medical history significant for chronic respiratory failure secondary to COPD on 2 L home oxygen, ongoing smoking, anemia, endometrial cancer status post recent hysterectomy and gallstones presents to hospital secondary to worsening shortness of breath and noted to have pneumonia.  #1 sepsis-secondary to community-acquired pneumonia. -Blood cultures growing strep  pneumococcus. Antibiotics changed to Rocephin IV. -Continue cough medications. -Needing 4 L oxygen at this time. Wean as tolerated.  #2 pulmonary edema-no prior history of congestive heart failure. -Follow low-sodium diet. Check echocardiogram. -For now continue Lasix.  #3 COPD exacerbation-continue steroids, nebulizer treatments.  #4 hypokalemia-being replaced.  #5 new onset atrial fibrillation-likely triggered by her respiratory status. Continue Cardizem for now. Follow-up echocardiogram. -Patient is started on eliquis twice a day. -We'll try to reach her surgeon to inform about starting eliquis as patient is scheduled for cholecystectomy elective next week. Might need to be rescheduled.  #6 GERD-on Protonix  #6 DVT prophylaxis-on eliquis   All the records are reviewed and case discussed with Care Management/Social Workerr. Management plans discussed with the patient, family and they are in agreement.  CODE STATUS: Full Code  TOTAL TIME TAKING CARE OF THIS PATIENT: 38 minutes.   POSSIBLE D/C IN 2 DAYS, DEPENDING ON CLINICAL CONDITION.   Gladstone Lighter M.D on 11/12/2016 at 3:36 PM  Between 7am to 6pm - Pager - [phone]  After 6pm go to [email or web address] - password [email or web address] [facility]  Office  [phone]  CC: Primary care physician; Dion Body, MD

## 2016-11-12 NOTE — Progress Notes (Signed)
Dr. Tressia Miners aware of low potassium - order 40 mEq KDur once

## 2016-11-13 ENCOUNTER — Inpatient Hospital Stay: Payer: Medicare HMO

## 2016-11-13 LAB — CBC
HCT: 37.1 % (ref 35.0–47.0)
Hemoglobin: 12.8 g/dL (ref 12.0–16.0)
MCH: 31.6 pg (ref 26.0–34.0)
MCHC: 34.5 g/dL (ref 32.0–36.0)
MCV: 91.3 fL (ref 80.0–100.0)
PLATELETS: 147 10*3/uL — AB (ref 150–440)
RBC: 4.06 MIL/uL (ref 3.80–5.20)
RDW: 13.2 % (ref 11.5–14.5)
WBC: 16.2 10*3/uL — AB (ref 3.6–11.0)

## 2016-11-13 LAB — BASIC METABOLIC PANEL
ANION GAP: 10 (ref 5–15)
BUN: 30 mg/dL — ABNORMAL HIGH (ref 6–20)
CALCIUM: 8.8 mg/dL — AB (ref 8.9–10.3)
CO2: 32 mmol/L (ref 22–32)
Chloride: 94 mmol/L — ABNORMAL LOW (ref 101–111)
Creatinine, Ser: 0.55 mg/dL (ref 0.44–1.00)
GFR calc Af Amer: >60 mL/min (ref >60)
GLUCOSE: 151 mg/dL — AB (ref 65–99)
Potassium: 3.4 mmol/L — ABNORMAL LOW (ref 3.5–5.1)
SODIUM: 136 mmol/L (ref 135–145)

## 2016-11-13 LAB — HEMOGLOBIN A1C
Hgb A1c MFr Bld: 5.5 % (ref 4.8–5.6)
Mean Plasma Glucose: 111 mg/dL

## 2016-11-13 LAB — GLUCOSE, CAPILLARY
GLUCOSE-CAPILLARY: 145 mg/dL — AB (ref 65–99)
GLUCOSE-CAPILLARY: 168 mg/dL — AB (ref 65–99)
Glucose-Capillary: 124 mg/dL — ABNORMAL HIGH (ref 65–99)
Glucose-Capillary: 196 mg/dL — ABNORMAL HIGH (ref 65–99)

## 2016-11-13 MED ORDER — BISACODYL 5 MG PO TBEC
10.0000 mg | DELAYED_RELEASE_TABLET | Freq: Every day | ORAL | Status: DC | PRN
  Administered 2016-11-13: 10 mg via ORAL
  Filled 2016-11-13: qty 2

## 2016-11-13 NOTE — Progress Notes (Signed)
Initial Heart Failure Clinic appointment scheduled for November 26, 2016 at 9:30am. Thank you for the referral.

## 2016-11-13 NOTE — Progress Notes (Signed)
Patient had been scheduled for elective cholecystectomy on Monday, December 11th. In light of her present admission with significant pulmonary dysfunction, this will be put on hold. Patient and husband informed as to why proceeding in light of her present pulmonary status would be a poor idea. When pulmonary status stable, and hopefully back in sinus rhythm, will look to reschedule surgery. Today's ultrasound does not show evidence of acute cholecystitis necessitating emergency intervention.

## 2016-11-13 NOTE — Discharge Instructions (Addendum)
Outpatient follow-up with the cardiology Dr. Clayborn Bigness in a week Monitor daily weights Outpatient follow-up with surgery Dr. Charissa Bash in a week Outpatient follow-up with primary care physician in a week    Heart Failure Clinic appointment on November 26, 2016 at 9:30am with Darylene Price, [place]. Please call [phone] to reschedule.   Information on my medicine - ELIQUIS (apixaban)  This medication education was reviewed with me or my healthcare representative as part of my discharge preparation.  The pharmacist that spoke with me during my hospital stay was:  Cheri Guppy, [facility]  Why was Eliquis prescribed for you? Eliquis was prescribed for you to reduce the risk of a blood clot forming that can cause a stroke if you have a medical condition called atrial fibrillation (a type of irregular heartbeat).  What do You need to know about Eliquis ? Take your Eliquis TWICE DAILY - one tablet in the morning and one tablet in the evening with or without food. If you have difficulty swallowing the tablet whole please discuss with your pharmacist how to take the medication safely.  Take Eliquis exactly as prescribed by your doctor and DO NOT stop taking Eliquis without talking to the doctor who prescribed the medication.  Stopping may increase your risk of developing a stroke.  Refill your prescription before you run out.  After discharge, you should have regular check-up appointments with your healthcare provider that is prescribing your Eliquis.  In the future your dose may need to be changed if your kidney function or weight changes by a significant amount or as you get older.  What do you do if you miss a dose? If you miss a dose, take it as soon as you remember on the same day and resume taking twice daily.  Do not take more than one dose of ELIQUIS at the same time to make up a missed dose.  Important Safety Information A possible side effect of Eliquis is bleeding. You should call your  healthcare provider right away if you experience any of the following: ? Bleeding from an injury or your nose that does not stop. ? Unusual colored urine (red or dark brown) or unusual colored stools (red or black). ? Unusual bruising for unknown reasons. ? A serious fall or if you hit your head (even if there is no bleeding).  Some medicines may interact with Eliquis and might increase your risk of bleeding or clotting while on Eliquis. To help avoid this, consult your healthcare provider or pharmacist prior to using any new prescription or non-prescription medications, including herbals, vitamins, non-steroidal anti-inflammatory drugs (NSAIDs) and supplements.  This website has more information on Eliquis (apixaban): http://www.eliquis.com/eliquis/home

## 2016-11-13 NOTE — Progress Notes (Signed)
[facility] [place] at [place] NAME: Jessica Dalton    MR#:  [id number]  DATE OF BIRTH:  1951/04/02  SUBJECTIVE:  CHIEF COMPLAINT:   Chief Complaint  Patient presents with  . Shortness of Breath  . Abdominal Pain   - Admitted with cough and worsening breathing. Noted to be hypoxic on her home 2 L oxygen and currently on 4 L nasal cannula. Today during my examination patient is feeling better but still coughing and reporting blood-tinged sputum  REVIEW OF SYSTEMS:  Review of Systems  Constitutional: Positive for malaise/fatigue. Negative for chills and fever.  HENT: Positive for congestion. Negative for ear discharge, hearing loss and nosebleeds.   Eyes: Negative for blurred vision.  Respiratory: Positive for cough, shortness of breath and wheezing.   Cardiovascular: Negative for chest pain, palpitations and leg swelling.  Gastrointestinal: Positive for abdominal pain and nausea. Negative for constipation, diarrhea and vomiting.  Genitourinary: Negative for dysuria and urgency.  Musculoskeletal: Positive for myalgias.  Neurological: Negative for dizziness, sensory change, speech change, focal weakness, seizures and headaches.  Psychiatric/Behavioral: Negative for depression.    DRUG ALLERGIES:  No Known Allergies  VITALS:  Blood pressure 116/69, pulse 85, temperature 98.3 F (36.8 C), temperature source Oral, resp. rate 20, height 5\' 1"  (1.549 m), weight 101 kg (222 lb 9.6 oz), SpO2 (!) 89 %.  PHYSICAL EXAMINATION:  Physical Exam  GENERAL:  65 y.o.-year-old patient lying in the bed with no acute distress.  EYES: Pupils equal, round, reactive to light and accommodation. No scleral icterus. Extraocular muscles intact.  HEENT: Head atraumatic, normocephalic. Oropharynx and nasopharynx clear.  NECK:  Supple, no jugular venous distention. No thyroid enlargement, no tenderness.  LUNGS: Normal breath sounds bilaterally, But has bibasilar  rhonchi, worse on the right base. -no wheezing, rales or crepitation. No use of accessory muscles of respiration.  CARDIOVASCULAR: S1, S2 normal. No murmurs, rubs, or gallops.  ABDOMEN: Soft, nontender, nondistended. Bowel sounds present. No organomegaly or mass.  EXTREMITIES: No pedal edema, cyanosis, or clubbing.  NEUROLOGIC: Cranial nerves II through XII are intact. Muscle strength 5/5 in all extremities. Sensation intact. Gait not checked.  PSYCHIATRIC: The patient is alert and oriented x 3.  SKIN: No obvious rash, lesion, or ulcer.    LABORATORY PANEL:   CBC  Recent Labs Lab 11/13/16 0335  WBC 16.2*  HGB 12.8  HCT 37.1  PLT 147*   ------------------------------------------------------------------------------------------------------------------  Chemistries   Recent Labs Lab 11/11/16 1607  11/12/16 1550 11/13/16 0335  NA 134*  < >  --  136  K 3.1*  < > 3.2* 3.4*  CL 91*  < >  --  94*  CO2 32  < >  --  32  GLUCOSE 102*  < >  --  151*  BUN 16  < >  --  30*  CREATININE 0.51  < >  --  0.55  CALCIUM 8.6*  < >  --  8.8*  MG  --   --  1.9  --   AST 20  --   --   --   ALT 14  --   --   --   ALKPHOS 68  --   --   --   BILITOT 1.4*  --   --   --   < > = values in this interval not displayed. ------------------------------------------------------------------------------------------------------------------  Cardiac Enzymes  Recent Labs Lab 11/12/16 1033  TROPONINI <0.03   ------------------------------------------------------------------------------------------------------------------  RADIOLOGY:  Dg Chest 1 View  Result Date: 11/11/2016 CLINICAL DATA:  Two days of shortness of breath. Two weeks of abdominal pain. Currently has a productive cough. Long history of smoking. EXAM: CHEST 1 VIEW COMPARISON:  Portable chest x-ray of October 16, 2016 FINDINGS: The lungs remain hyperinflated. The interstitial markings are increased bilaterally. The cardiac silhouette is  further enlarged. The retrocardiac density on the left has increased. The pulmonary vascularity is engorged. There is calcification in the wall of the aortic arch. IMPRESSION: CHF with mild interstitial edema more conspicuous than on the previous study. New left lower lobe atelectasis or pneumonia and small left pleural effusion. Followup PA and lateral chest X-ray is recommended in 3-4 weeks following trial of antibiotic therapy to ensure resolution and exclude underlying malignancy. Electronically Signed   By: David  Martinique M.D.   On: 11/11/2016 16:32   US Abdomen Complete  Result Date: 11/12/2016 CLINICAL DATA:  Abdominal pain . EXAM: ABDOMEN ULTRASOUND COMPLETE COMPARISON:  CT 09/24/2016. FINDINGS: Gallbladder: Gallbladder is distended with sludge and gallstones. Largest gallstone measures 2.5 cm. Gallbladder wall thickness 2.2 mm. Negative Murphy sign Common bile duct: Diameter: 5.7 mm Liver: No focal lesion identified. Within normal limits in parenchymal echogenicity. IVC: No abnormality visualized. Pancreas: Visualized portion unremarkable. Spleen: Size and appearance within normal limits. Right Kidney: Length: 12.9 cm. Echogenicity within normal limits. No mass or hydronephrosis visualized. Left Kidney: Length: 13.6 cm. Echogenicity within normal limits. No mass or hydronephrosis visualized. Abdominal aorta: No aneurysm visualized. Other findings: None. IMPRESSION: 1. Gall bladder is distended with sludge and gallstones. No evidence cholecystitis. 2.  Exam is otherwise unremarkable. Electronically Signed   By: Marcello Moores  Register   On: 11/12/2016 08:36    EKG:   Orders placed or performed during the hospital encounter of 11/11/16  . ED EKG  . ED EKG  . EKG 12-lead    ASSESSMENT AND PLAN:   65 y/o F with past medical history significant for chronic respiratory failure secondary to COPD on 2 L home oxygen, ongoing smoking, anemia, endometrial cancer status post recent hysterectomy and gallstones  presents to hospital secondary to worsening shortness of breath and noted to have pneumonia.  #1 sepsis-secondary to community-acquired pneumonia. -Blood cultures growing strep pneumococcus. Antibiotics changed to Rocephin IV. -Continue cough medications. -Needing 4 L oxygen at this time. Wean off as tolerated. -Sputum culture and sensitivity  #2 pulmonary edema-no prior history of congestive heart failure. -Follow low-sodium diet.  -echocardiogram  With 60-65% ejection fraction, normal wall motion-  #3 COPD exacerbation- Clinically improving continue steroids, nebulizer treatments.  #4 hypokalemia-being replaced.  #5 new onset atrial fibrillation-likely triggered by her respiratory status. Continue Cardizem for now.  -Patient is started on eliquis twice a day. cardiac disease agreeable -patients surgeon Dr. Charissa Bash has rescheduled her cholecystectomy for next month as patient just restarted on eliquis as reported by the patient   #6 GERD-on Protonix  #6 DVT prophylaxis-on eliquis   All the records are reviewed and case discussed with Care Management/Social Workerr. Management plans discussed with the patient, family and they are in agreement.  CODE STATUS: Full Code  TOTAL TIME TAKING CARE OF THIS PATIENT: 36 minutes.   POSSIBLE D/C IN 1- 2 DAYS, DEPENDING ON CLINICAL CONDITION.   Nicholes Mango M.D on 11/13/2016 at 3:20 PM  Between 7am to 6pm - Pager - [phone]  After 6pm go to [email or web address] - password [email or web address] [facility]  Office  [phone]  CC: Primary care  physician; Dion Body, MD

## 2016-11-14 ENCOUNTER — Encounter: Payer: Self-pay | Admitting: *Deleted

## 2016-11-14 ENCOUNTER — Inpatient Hospital Stay: Admission: RE | Admit: 2016-11-14 | Payer: Medicare HMO | Source: Ambulatory Visit

## 2016-11-14 LAB — CULTURE, BLOOD (ROUTINE X 2)

## 2016-11-14 LAB — GLUCOSE, CAPILLARY
Glucose-Capillary: 130 mg/dL — ABNORMAL HIGH (ref 65–99)
Glucose-Capillary: 195 mg/dL — ABNORMAL HIGH (ref 65–99)
Glucose-Capillary: 115 mg/dL — ABNORMAL HIGH (ref 65–99)
Glucose-Capillary: 235 mg/dL — ABNORMAL HIGH (ref 65–99)

## 2016-11-14 LAB — CBC
HEMATOCRIT: 38.4 % (ref 35.0–47.0)
Hemoglobin: 13.1 g/dL (ref 12.0–16.0)
MCH: 31 pg (ref 26.0–34.0)
MCHC: 34.2 g/dL (ref 32.0–36.0)
MCV: 90.5 fL (ref 80.0–100.0)
PLATELETS: 168 10*3/uL (ref 150–440)
RBC: 4.24 MIL/uL (ref 3.80–5.20)
RDW: 13.8 % (ref 11.5–14.5)
WBC: 12.3 10*3/uL — AB (ref 3.6–11.0)

## 2016-11-14 LAB — BASIC METABOLIC PANEL
Anion gap: 9 (ref 5–15)
BUN: 25 mg/dL — ABNORMAL HIGH (ref 6–20)
CO2: 34 mmol/L — ABNORMAL HIGH (ref 22–32)
Calcium: 8.5 mg/dL — ABNORMAL LOW (ref 8.9–10.3)
Chloride: 95 mmol/L — ABNORMAL LOW (ref 101–111)
Creatinine, Ser: 0.57 mg/dL (ref 0.44–1.00)
GFR calc Af Amer: >60 mL/min (ref >60)
GFR calc non Af Amer: >60 mL/min (ref >60)
Glucose, Bld: 147 mg/dL — ABNORMAL HIGH (ref 65–99)
Potassium: 3 mmol/L — ABNORMAL LOW (ref 3.5–5.1)
Sodium: 138 mmol/L (ref 135–145)

## 2016-11-14 MED ORDER — CALCIUM CARBONATE ANTACID 500 MG PO CHEW
1.0000 | CHEWABLE_TABLET | Freq: Two times a day (BID) | ORAL | Status: DC
  Administered 2016-11-14 – 2016-11-15 (×3): 200 mg via ORAL
  Filled 2016-11-14 (×3): qty 1

## 2016-11-14 MED ORDER — POTASSIUM CHLORIDE CRYS ER 20 MEQ PO TBCR
40.0000 meq | EXTENDED_RELEASE_TABLET | ORAL | Status: AC
  Administered 2016-11-14 (×2): 40 meq via ORAL
  Filled 2016-11-14 (×2): qty 2

## 2016-11-14 MED ORDER — PREDNISONE 50 MG PO TABS
50.0000 mg | ORAL_TABLET | Freq: Every day | ORAL | Status: DC
  Administered 2016-11-15: 50 mg via ORAL
  Filled 2016-11-14: qty 1

## 2016-11-14 MED ORDER — PANTOPRAZOLE SODIUM 40 MG PO TBEC
40.0000 mg | DELAYED_RELEASE_TABLET | Freq: Every day | ORAL | Status: DC
  Administered 2016-11-14: 40 mg via ORAL
  Filled 2016-11-14: qty 1

## 2016-11-14 MED ORDER — FUROSEMIDE 20 MG PO TABS
20.0000 mg | ORAL_TABLET | Freq: Two times a day (BID) | ORAL | Status: DC
  Administered 2016-11-14 – 2016-11-15 (×2): 20 mg via ORAL
  Filled 2016-11-14 (×2): qty 1

## 2016-11-14 NOTE — Care Management (Signed)
Provided patient with Eliquis coupon for 30 day free trail.

## 2016-11-14 NOTE — Progress Notes (Signed)
[place] at [place] NAME: Blake Cubillas    MR#:  [id number]  DATE OF BIRTH:  11-14-1951  SUBJECTIVE:  CHIEF COMPLAINT:   Chief Complaint  Patient presents with  . Shortness of Breath  . Abdominal Pain   - Admitted with cough and worsening breathing. Noted to be hypoxic on her home 2 L oxygen and currently on 4 L nasal cannula. Today during my examination patient is feeling better. Reporting exertional dyspnea otherwise is fine  REVIEW OF SYSTEMS:  Review of Systems  Constitutional: Positive for malaise/fatigue. Negative for chills and fever.  HENT: Positive for congestion. Negative for ear discharge, hearing loss and nosebleeds.   Eyes: Negative for blurred vision.  Respiratory: Positive for cough and shortness of breath. Negative for wheezing.   Cardiovascular: Negative for chest pain, palpitations and leg swelling.  Gastrointestinal: Positive for abdominal pain and nausea. Negative for constipation, diarrhea and vomiting.  Genitourinary: Negative for dysuria and urgency.  Musculoskeletal: Negative for myalgias.  Neurological: Negative for dizziness, sensory change, speech change, focal weakness, seizures and headaches.  Psychiatric/Behavioral: Negative for depression.    DRUG ALLERGIES:  No Known Allergies  VITALS:  Blood pressure 110/61, pulse 93, temperature 98.4 F (36.9 C), temperature source Oral, resp. rate 18, height 5\' 1"  (1.549 m), weight 100.6 kg (221 lb 12.8 oz), SpO2 93 %.  PHYSICAL EXAMINATION:  Physical Exam  GENERAL:  65 y.o.-year-old patient lying in the bed with no acute distress.  EYES: Pupils equal, round, reactive to light and accommodation. No scleral icterus. Extraocular muscles intact.  HEENT: Head atraumatic, normocephalic. Oropharynx and nasopharynx clear.  NECK:  Supple, no jugular venous distention. No thyroid enlargement, no tenderness.  LUNGS: Normal breath sounds bilaterally, But has bibasilar  rhonchi, worse on the right base. -no wheezing, rales or crepitation. No use of accessory muscles of respiration.  CARDIOVASCULAR: S1, S2 normal. No murmurs, rubs, or gallops.  ABDOMEN: Soft, nontender, nondistended. Bowel sounds present. No organomegaly or mass.  EXTREMITIES: No pedal edema, cyanosis, or clubbing.  NEUROLOGIC: Cranial nerves II through XII are intact. Muscle strength 5/5 in all extremities. Sensation intact. Gait not checked.  PSYCHIATRIC: The patient is alert and oriented x 3.  SKIN: No obvious rash, lesion, or ulcer.    LABORATORY PANEL:   CBC  Recent Labs Lab 11/14/16 0514  WBC 12.3*  HGB 13.1  HCT 38.4  PLT 168   ------------------------------------------------------------------------------------------------------------------  Chemistries   Recent Labs Lab 11/11/16 1607  11/12/16 1550  11/14/16 0514  NA 134*  < >  --   < > 138  K 3.1*  < > 3.2*  < > 3.0*  CL 91*  < >  --   < > 95*  CO2 32  < >  --   < > 34*  GLUCOSE 102*  < >  --   < > 147*  BUN 16  < >  --   < > 25*  CREATININE 0.51  < >  --   < > 0.57  CALCIUM 8.6*  < >  --   < > 8.5*  MG  --   --  1.9  --   --   AST 20  --   --   --   --   ALT 14  --   --   --   --   ALKPHOS 68  --   --   --   --   BILITOT  1.4*  --   --   --   --   < > = values in this interval not displayed. ------------------------------------------------------------------------------------------------------------------  Cardiac Enzymes  Recent Labs Lab 11/12/16 1033  TROPONINI <0.03   ------------------------------------------------------------------------------------------------------------------  RADIOLOGY:  No results found.  EKG:   Orders placed or performed during the hospital encounter of 11/11/16  . ED EKG  . ED EKG  . EKG 12-lead    ASSESSMENT AND PLAN:   65 y/o F with past medical history significant for chronic respiratory failure secondary to COPD on 2 L home oxygen, ongoing smoking, anemia,  endometrial cancer status post recent hysterectomy and gallstones presents to hospital secondary to worsening shortness of breath and noted to have pneumonia.  #1 sepsis-secondary to community-acquired pneumonia. -Blood cultures growing strep pneumococcus. Antibiotics changed to Rocephin IV. -Continue cough medications. -Needing 4 L oxygen at this time. Wean off as tolerated. Patient lives on 2 L of oxygen at home -Sputum culture and sensitivity  #2 pulmonary edema-no prior history of congestive heart failure. -Follow low-sodium diet.  -echocardiogram  With 60-65% ejection fraction, normal wall motion- Continue Lasix and change IV to by mouth form  #3 COPD exacerbation- Clinically improving continue steroids, nebulizer treatments.  #4 hypokalemia-being replaced.  #5 new onset atrial fibrillation-likely triggered by her respiratory status. Continue Cardizem for now.  -Patient is started on eliquis twice a day. cardio agreeable -patients surgeon Dr. Charissa Bash has rescheduled her cholecystectomy for next month as patient just restarted on eliquis as reported by the patient   #6 GERD-on Protonix  #6 DVT prophylaxis-on eliquis  PT has recommended no PT follow-up    All the records are reviewed and case discussed with Care Management/Social Workerr. Management plans discussed with the patient, family and they are in agreement.  CODE STATUS: Full Code  TOTAL TIME TAKING CARE OF THIS PATIENT: 36 minutes.   POSSIBLE D/C IN A.M. DAYS, DEPENDING ON CLINICAL CONDITION.   Nicholes Mango M.D on 11/14/2016 at 2:49 PM  Between 7am to 6pm - Pager - [phone]  After 6pm go to [email or web address] - password [email or web address] [facility]  Office  [phone]  CC: Primary care physician; Dion Body, MD

## 2016-11-14 NOTE — Evaluation (Signed)
Physical Therapy Evaluation Patient Details Name: Edrena Hanners MRN: [id number] DOB: 1950/12/31 Today's Date: 11/14/2016   History of Present Illness  presented to ER with progressive SOB, abdominal pain (status post laprascopic hysterectomy 11/8); admitted with sepsis related to CAP, new onset afib requiring initial cardizem drip, new onset CHF.  Currently on 4L supplemental O2.  Clinical Impression  Upon evaluation, patient alert and oriented; follows all commands and demonstrates good safety awareness/insight.  Bilat Ue/LE strength and ROM grossly symmetrical and WFL; no focal weakness noted.  Does endorse generalized soreness in abdomen, result of previous abdominal surgery (11/8).  Able to complete bed mobility indep; sit/stand, basic transfers and gait (220') with RW, sup/mod indep.  No buckling, LOB or significant safety concern noted; mod use of RW for external support and energy conservation.  Maintains SaO2 >89% on 4L, peak HR 124 with activity. Will maintain on PT caseload for remaining hospitalization for cardiopulm monitoring with progressive activity; anticipate no skilled PT needs upon discharge.     Follow Up Recommendations No PT follow up (will maintain on caseload during remaining hospitalization; anticipate no skilled PT needs upon discharge)    Equipment Recommendations       Recommendations for Other Services       Precautions / Restrictions Precautions Precautions: Fall Restrictions Weight Bearing Restrictions: No      Mobility  Bed Mobility Overal bed mobility: Independent                Transfers Overall transfer level: Needs assistance Equipment used: None Transfers: Sit to/from Stand;Stand Pivot Transfers Sit to Stand: Supervision Stand pivot transfers: Supervision       General transfer comment: tends to reach for external surfaces for stabilization with all mobility/transfers  Ambulation/Gait Ambulation/Gait assistance:  Supervision Ambulation Distance (Feet): 220 Feet Assistive device: Rolling walker (2 wheeled)       General Gait Details: reciprocal stepping pattern with slow, but steady, cadence and overall gait speed.  No buckling or LOB.  Single standing rest period due to SOB; maintains SaO2 >89% on 4L, peak HR 124 with activity.  Stairs            Wheelchair Mobility    Modified Rankin (Stroke Patients Only)       Balance Overall balance assessment: Needs assistance Sitting-balance support: No upper extremity supported;Feet supported Sitting balance-Leahy Scale: Good     Standing balance support: Bilateral upper extremity supported Standing balance-Leahy Scale: Fair                   Standardized Balance Assessment Standardized Balance Assessment : Berg Balance Test           Pertinent Vitals/Pain Pain Assessment: 0-10 Pain Score: 4  Pain Location: abdomen Pain Descriptors / Indicators: Aching;Sore Pain Intervention(s): Limited activity within patient's tolerance;Repositioned    Home Living Family/patient expects to be discharged to:: Private residence Living Arrangements: Spouse/significant other Available Help at Discharge: Family Type of Home: House Home Access: Ramped entrance     Home Layout: One level Home Equipment: Environmental consultant - 2 wheels      Prior Function Level of Independence: Independent         Comments: Indep for ADLs, household and community mobiltiy wihtout assist device; denies fall history.     Hand Dominance        Extremity/Trunk Assessment   Upper Extremity Assessment: Overall WFL for tasks assessed           Lower Extremity Assessment: Overall WFL for  tasks assessed (grossly at least 4+/5 throughout)         Communication   Communication: No difficulties  Cognition Arousal/Alertness: Awake/alert Behavior During Therapy: WFL for tasks assessed/performed Overall Cognitive Status: Within Functional Limits for tasks  assessed                      General Comments      Exercises Other Exercises Other Exercises: Toilet transfer, SPT without assist device, close sup; sit/stand and standing balance during hygiene, close sup.  Tends to reach for bedrails for external stabilization.   Assessment/Plan    PT Assessment Patient needs continued PT services  PT Problem List Cardiopulmonary status limiting activity;Decreased activity tolerance;Decreased balance          PT Treatment Interventions DME instruction;Gait training;Functional mobility training;Therapeutic activities;Therapeutic exercise;Balance training;Patient/family education    PT Goals (Current goals can be found in the Care Plan section)  Acute Rehab PT Goals Patient Stated Goal: to return home when I'm ready PT Goal Formulation: With patient Time For Goal Achievement: 11/28/16 Potential to Achieve Goals: Good Additional Goals Additional Goal #1: Indep use of energy conservation and activity pacing strategies with functional activities    Frequency Min 2X/week   Barriers to discharge        Co-evaluation               End of Session Equipment Utilized During Treatment: Gait belt Activity Tolerance: Patient tolerated treatment well Patient left: in bed;with call bell/phone within reach (bed alarm not required by primary RN; patient aware of need for assist with transfers/mobility)           Time: [id number] PT Time Calculation (min) (ACUTE ONLY): 15 min   Charges:   PT Evaluation $PT Eval Low Complexity: 1 Procedure PT Treatments $Therapeutic Activity: 8-22 mins   PT G Codes:        Belma Dyches H. Owens Shark, PT, DPT, NCS 11/14/16, 11:15 AM [phone]

## 2016-11-14 NOTE — Care Management Important Message (Signed)
Important Message  Patient Details  Name: Mistique Auten MRN: [id number] Date of Birth: 1951-06-20   Medicare Important Message Given:  Yes    Katrina Stack, RN 11/14/2016, 5:09 PM

## 2016-11-14 NOTE — Progress Notes (Signed)
Dr. Margaretmary Eddy made aware of ca+ 6.3 and K+ 3.0

## 2016-11-14 NOTE — Progress Notes (Signed)
PHARMACIST - PHYSICIAN COMMUNICATION  CONCERNING: IV to Oral Route Change Policy  RECOMMENDATION: This patient is receiving PROTONIX by the intravenous route.  Based on criteria approved by the Pharmacy and Therapeutics Committee, the intravenous medication(s) is/are being converted to the equivalent oral dose form(s).   DESCRIPTION: These criteria include:  The patient is eating (either orally or via tube) and/or has been taking other orally administered medications for a least 24 hours  The patient has no evidence of active gastrointestinal bleeding or impaired GI absorption (gastrectomy, short bowel, patient on TNA or NPO).  If you have questions about this conversion, please contact the Pharmacy Department  []   [phone] )  [facility] [x]   [phone] )  [facility] []   [phone] )  [facility] []   [phone] )  [facility] []   [phone] )  [place], [facility] 11/14/2016 10:37 AM

## 2016-11-14 NOTE — Progress Notes (Signed)
    Per Dr. Bary Castilla: Cancel cholecystectomy scheduled for Monday. Patient in the hospital with pneumonia.    O.R. Notified of cancellation via fax.  Heather in Pre-admission was left a message not to call patient for phone interview that was scheduled for later today.

## 2016-11-15 LAB — BASIC METABOLIC PANEL
ANION GAP: 6 (ref 5–15)
BUN: 18 mg/dL (ref 6–20)
CALCIUM: 8.6 mg/dL — AB (ref 8.9–10.3)
CO2: 37 mmol/L — ABNORMAL HIGH (ref 22–32)
CREATININE: 0.5 mg/dL (ref 0.44–1.00)
Chloride: 94 mmol/L — ABNORMAL LOW (ref 101–111)
GLUCOSE: 139 mg/dL — AB (ref 65–99)
Potassium: 3.8 mmol/L (ref 3.5–5.1)
Sodium: 137 mmol/L (ref 135–145)

## 2016-11-15 LAB — CBC
HCT: 39.8 % (ref 35.0–47.0)
Hemoglobin: 13.6 g/dL (ref 12.0–16.0)
MCH: 31.1 pg (ref 26.0–34.0)
MCHC: 34.1 g/dL (ref 32.0–36.0)
MCV: 91.2 fL (ref 80.0–100.0)
PLATELETS: 196 10*3/uL (ref 150–440)
RBC: 4.37 MIL/uL (ref 3.80–5.20)
RDW: 13.6 % (ref 11.5–14.5)
WBC: 13 10*3/uL — AB (ref 3.6–11.0)

## 2016-11-15 LAB — CULTURE, RESPIRATORY W GRAM STAIN: Culture: NORMAL

## 2016-11-15 LAB — GLUCOSE, CAPILLARY
GLUCOSE-CAPILLARY: 163 mg/dL — AB (ref 65–99)
Glucose-Capillary: 122 mg/dL — ABNORMAL HIGH (ref 65–99)

## 2016-11-15 LAB — CULTURE, RESPIRATORY

## 2016-11-15 MED ORDER — POTASSIUM CHLORIDE ER 10 MEQ PO TBCR: 10.0000 meq | EXTENDED_RELEASE_TABLET | Freq: Every day | ORAL | 0 refills | Status: DC

## 2016-11-15 MED ORDER — DILTIAZEM HCL ER COATED BEADS 120 MG PO CP24: 120.0000 mg | ORAL_CAPSULE | Freq: Every day | ORAL | 0 refills | Status: DC

## 2016-11-15 MED ORDER — FUROSEMIDE 20 MG PO TABS: 20.0000 mg | ORAL_TABLET | Freq: Two times a day (BID) | ORAL | 0 refills | Status: AC

## 2016-11-15 MED ORDER — ASPIRIN 81 MG PO TBEC: 81.0000 mg | DELAYED_RELEASE_TABLET | Freq: Every day | ORAL | Status: AC

## 2016-11-15 MED ORDER — TRAMADOL HCL 50 MG PO TABS: 50.0000 mg | ORAL_TABLET | Freq: Four times a day (QID) | ORAL | 0 refills | Status: DC | PRN

## 2016-11-15 MED ORDER — APIXABAN 5 MG PO TABS: 10.0000 mg | ORAL_TABLET | Freq: Two times a day (BID) | ORAL | 0 refills | Status: DC

## 2016-11-15 MED ORDER — PREDNISONE 10 MG (21) PO TBPK: 10.0000 mg | ORAL_TABLET | Freq: Every day | ORAL | 0 refills | Status: DC

## 2016-11-15 MED ORDER — CALCIUM CARBONATE ANTACID 500 MG PO CHEW: 1.0000 | CHEWABLE_TABLET | Freq: Two times a day (BID) | ORAL | Status: DC

## 2016-11-15 MED ORDER — AMOXICILLIN-POT CLAVULANATE 875-125 MG PO TABS: 1.0000 | ORAL_TABLET | Freq: Two times a day (BID) | ORAL | 0 refills | Status: DC

## 2016-11-15 MED ORDER — ALBUTEROL SULFATE HFA 108 (90 BASE) MCG/ACT IN AERS: 2.0000 | INHALATION_SPRAY | Freq: Four times a day (QID) | RESPIRATORY_TRACT | 1 refills | Status: DC | PRN

## 2016-11-15 NOTE — Plan of Care (Signed)
Problem: Education: Goal: Ability to demonstrate managment of disease process will improve Outcome: Progressing Living with heart failure booklet reviewed with patient.  Understanding voiced.

## 2016-11-15 NOTE — Discharge Summary (Signed)
[facility] NAME: Jessica Dalton    MR#:  [id number]  DATE OF BIRTH:  10-Apr-1951  DATE OF ADMISSION:  11/11/2016 ADMITTING PHYSICIAN: Nicholes Mango, MD  DATE OF DISCHARGE: 11/15/16  PRIMARY CARE PHYSICIAN: Dion Body, MD    ADMISSION DIAGNOSIS:  Acute pulmonary edema (HCC) [J81.0] SOB (shortness of breath) [R06.02] Generalized abdominal pain [R10.84] New onset atrial fibrillation (HCC) [I48.91] Atrial fibrillation with rapid ventricular response ([place]) [I48.91] Community acquired pneumonia of left lower lobe of lung ([place]) [J18.1]  DISCHARGE DIAGNOSIS:   Sepsis secondary to community-acquired pneumonia New diastolic congestive heart failure Atrial fibrillation with RVR COPD exacerbation  SECONDARY DIAGNOSIS:   Past Medical History:  Diagnosis Date  . Anemia   . Asthma   . Cancer ([place])    uterine ca  . Endometrial cancer ([place])   . Gallstones 10/16/2016  . Obesity   . Tobacco use     HOSPITAL COURSE:    65 y/o F with past medical history significant for chronic respiratory failure secondary to COPD on 2 L home oxygen, ongoing smoking, anemia, endometrial cancer status post recent hysterectomy and gallstones presents to hospital secondary to worsening shortness of breath and noted to have pneumonia.Please review history and physical for complete details. Please review  progress note for hospital course  #1 sepsis-secondary to community-acquired pneumonia. -Blood cultures growing strep pneumococcus. Antibiotics changed to Rocephin IV.Patient clinically improved. We will discharge patient with by mouth Augmentin -Continue cough medications. -Needing 4 L oxygen at this time. Wean off as tolerated. Patient previously was living on 2 L of oxygen at home -Sputum culture and sensitivity ordered but sample Not collected  #2 pulmonary edema-no prior history of congestive heart failure. -Follow low-sodium diet.   -echocardiogram With 60-65% ejection fraction, normal wall motion- Continue Lasix and change IV to by mouth form -Outpatient CHF clinic and monitor daily weights -Outpatient follow-up with cardiology  #3 COPD exacerbation- Clinically improving continue steroids, nebulizer treatments.  #4 hypokalemia-being replaced.  #5 new onset atrial fibrillation-likely triggered by her respiratory status. Continue Cardizem  -Patient is started on eliquis twice a day. cardio agreeable -patients surgeon Dr. Charissa Bash has rescheduled her cholecystectomy for next month as patient just restarted on eliquis as reported by the patient   #6 GERD-on Protonix  #6 DVT prophylaxis-on eliquis  PT has recommended no PT follow-up   DISCHARGE CONDITIONS:   FAIR  CONSULTS OBTAINED:  Treatment Team:  Yolonda Kida, MD   PROCEDURES None  DRUG ALLERGIES:  No Known Allergies  DISCHARGE MEDICATIONS:   Current Discharge Medication List    START taking these medications   Details  albuterol (PROVENTIL HFA;VENTOLIN HFA) 108 (90 Base) MCG/ACT inhaler Inhale 2 puffs into the lungs every 6 (six) hours as needed for wheezing or shortness of breath. Qty: 1 Inhaler, Refills: 1    amoxicillin-clavulanate (AUGMENTIN) 875-125 MG tablet Take 1 tablet by mouth 2 (two) times daily. Qty: 10 tablet, Refills: 0    apixaban (ELIQUIS) 5 MG TABS tablet Take 2 tablets (10 mg total) by mouth 2 (two) times daily. Take 10 mg by mouth twice a day for 3 more days followed by 5 mg by mouth twice a day Qty: 72 tablet, Refills: 0    aspirin EC 81 MG EC tablet Take 1 tablet (81 mg total) by mouth daily.    calcium carbonate (TUMS - DOSED IN MG ELEMENTAL CALCIUM) 500 MG chewable tablet Chew 1 tablet (200 mg  of elemental calcium total) by mouth 2 (two) times daily.    diltiazem (CARDIZEM CD) 120 MG 24 hr capsule Take 1 capsule (120 mg total) by mouth daily. Qty: 30 capsule, Refills: 0    furosemide (LASIX) 20 MG  tablet Take 1 tablet (20 mg total) by mouth 2 (two) times daily. Qty: 60 tablet, Refills: 0    potassium chloride (K-DUR) 10 MEQ tablet Take 1 tablet (10 mEq total) by mouth daily. Qty: 30 tablet, Refills: 0    predniSONE (STERAPRED UNI-PAK 21 TAB) 10 MG (21) TBPK tablet Take 1 tablet (10 mg total) by mouth daily. Take 6 tablets by mouth for 1 day followed by  5 tablets by mouth for 1 day followed by  4 tablets by mouth for 1 day followed by  3 tablets by mouth for 1 day followed by  2 tablets by mouth for 1 day followed by  1 tablet by mouth for a day and stop Qty: 21 tablet, Refills: 0    traMADol (ULTRAM) 50 MG tablet Take 1 tablet (50 mg total) by mouth every 6 (six) hours as needed for moderate pain. Qty: 30 tablet, Refills: 0      CONTINUE these medications which have NOT CHANGED   Details  acetaminophen (TYLENOL) 500 MG tablet Take 1,500 mg by mouth daily as needed for mild pain.    ibuprofen (ADVIL,MOTRIN) 200 MG tablet Take 400 mg by mouth every 6 (six) hours as needed for headache or moderate pain.    umeclidinium-vilanterol (ANORO ELLIPTA) 62.5-25 MCG/INH AEPB Inhale 1 puff into the lungs daily. Qty: 60 each, Refills: 10    docusate sodium (COLACE) 100 MG capsule Take 1 capsule (100 mg total) by mouth daily as needed for mild constipation. Qty: 60 capsule, Refills: 3    nicotine (NICODERM CQ - DOSED IN MG/24 HOURS) 14 mg/24hr patch Place 1 patch (14 mg total) onto the skin daily. Qty: 30 patch, Refills: 10    nicotine (NICOTROL) 10 MG inhaler Inhale 1 Cartridge (1 continuous puffing total) into the lungs as needed for smoking cessation. Qty: 42 each, Refills: 10    polyethylene glycol (MIRALAX / GLYCOLAX) packet Take 17 g by mouth daily as needed for mild constipation or moderate constipation.         DISCHARGE INSTRUCTIONS:   Outpatient follow-up with the cardiology Dr. Clayborn Bigness in a week Monitor daily weights Outpatient follow-up with surgery Dr. Charissa Bash in  a week Outpatient follow-up with primary care physician in a week Outpatient follow-up with CHF clinic on December 19  DIET:  Cardiac diet  DISCHARGE CONDITION:  Fair  ACTIVITY:  Activity as tolerated  OXYGEN:  Home Oxygen: Yes.     Oxygen Delivery: 3-4 liters/min via Patient connected to nasal cannula oxygen  DISCHARGE LOCATION:  home   If you experience worsening of your admission symptoms, develop shortness of breath, life threatening emergency, suicidal or homicidal thoughts you must seek medical attention immediately by calling 911 or calling your MD immediately  if symptoms less severe.  You Must read complete instructions/literature along with all the possible adverse reactions/side effects for all the Medicines you take and that have been prescribed to you. Take any new Medicines after you have completely understood and accpet all the possible adverse reactions/side effects.   Please note  You were cared for by a hospitalist during your hospital stay. If you have any questions about your discharge medications or the care you received while you were in the hospital  after you are discharged, you can call the unit and asked to speak with the hospitalist on call if the hospitalist that took care of you is not available. Once you are discharged, your primary care physician will handle any further medical issues. Please note that NO REFILLS for any discharge medications will be authorized once you are discharged, as it is imperative that you return to your primary care physician (or establish a relationship with a primary care physician if you do not have one) for your aftercare needs so that they can reassess your need for medications and monitor your lab values.     Today  Chief Complaint  Patient presents with  . Shortness of Breath  . Abdominal Pain   Patient is doing much better. Shortness of breath significantly improved. Still has some cough  ROS:  CONSTITUTIONAL:  Denies fevers, chills. Denies any fatigue, weakness.  EYES: Denies blurry vision, double vision, eye pain. EARS, NOSE, THROAT: Denies tinnitus, ear pain, hearing loss. RESPIRATORY: Reporting some cough, denies wheeze, shortness of breath.  CARDIOVASCULAR: Denies chest pain, palpitations, edema.  GASTROINTESTINAL: Denies nausea, vomiting, diarrhea, abdominal pain. Denies bright red blood per rectum. GENITOURINARY: Denies dysuria, hematuria. ENDOCRINE: Denies nocturia or thyroid problems. HEMATOLOGIC AND LYMPHATIC: Denies easy bruising or bleeding. SKIN: Denies rash or lesion. MUSCULOSKELETAL: Denies pain in neck, back, shoulder, knees, hips or arthritic symptoms.  NEUROLOGIC: Denies paralysis, paresthesias.  PSYCHIATRIC: Denies anxiety or depressive symptoms.   VITAL SIGNS:  Blood pressure 114/65, pulse 97, temperature 98 F (36.7 C), temperature source Oral, resp. rate 18, height 5\' 1"  (1.549 m), weight 101.2 kg (223 lb), SpO2 95 %.  I/O:    Intake/Output Summary (Last 24 hours) at 11/15/16 1349 Last data filed at 11/15/16 0940  Gross per 24 hour  Intake              480 ml  Output              600 ml  Net             -120 ml    PHYSICAL EXAMINATION:  GENERAL:  65 y.o.-year-old patient lying in the bed with no acute distress.  EYES: Pupils equal, round, reactive to light and accommodation. No scleral icterus. Extraocular muscles intact.  HEENT: Head atraumatic, normocephalic. Oropharynx and nasopharynx clear.  NECK:  Supple, no jugular venous distention. No thyroid enlargement, no tenderness.  LUNGS: Moderate breath sounds bilaterally, no wheezing, rales,rhonchi or crepitation. No use of accessory muscles of respiration.  CARDIOVASCULAR: S1, S2 normal. No murmurs, rubs, or gallops.  ABDOMEN: Soft, non-tender, non-distended. Bowel sounds present. No organomegaly or mass.  EXTREMITIES: No pedal edema, cyanosis, or clubbing.  NEUROLOGIC: Cranial nerves II through XII are intact.  Muscle strength 5/5 in all extremities. Sensation intact. Gait not checked.  PSYCHIATRIC: The patient is alert and oriented x 3.  SKIN: No obvious rash, lesion, or ulcer.   DATA REVIEW:   CBC  Recent Labs Lab 11/15/16 0453  WBC 13.0*  HGB 13.6  HCT 39.8  PLT 196    Chemistries   Recent Labs Lab 11/11/16 1607  11/12/16 1550  11/15/16 0453  NA 134*  < >  --   < > 137  K 3.1*  < > 3.2*  < > 3.8  CL 91*  < >  --   < > 94*  CO2 32  < >  --   < > 37*  GLUCOSE 102*  < >  --   < >  139*  BUN 16  < >  --   < > 18  CREATININE 0.51  < >  --   < > 0.50  CALCIUM 8.6*  < >  --   < > 8.6*  MG  --   --  1.9  --   --   AST 20  --   --   --   --   ALT 14  --   --   --   --   ALKPHOS 68  --   --   --   --   BILITOT 1.4*  --   --   --   --   < > = values in this interval not displayed.  Cardiac Enzymes  Recent Labs Lab 11/12/16 1033  TROPONINI <0.03    Microbiology Results  Results for orders placed or performed during the hospital encounter of 11/11/16  Blood culture (routine x 2)     Status: Abnormal   Collection Time: 11/11/16  4:48 PM  Result Value Ref Range Status   Specimen Description BLOOD BLOOD RIGHT FOREARM  Final   Special Requests BOTTLES DRAWN AEROBIC AND ANAEROBIC AA15CC [id number]  Final   Culture  Setup Time   Final    GRAM POSITIVE COCCI IN BOTH AEROBIC AND ANAEROBIC BOTTLES CRITICAL RESULT CALLED TO, READ BACK BY AND VERIFIED WITH: KAREN HAYES 11/12/16 0806 SGD    Culture (A)  Final    STREPTOCOCCUS PNEUMONIAE SUSCEPTIBILITIES PERFORMED ON PREVIOUS CULTURE WITHIN THE LAST 5 DAYS. Performed at [facility]    Report Status 11/14/2016 FINAL  Final  Blood culture (routine x 2)     Status: Abnormal   Collection Time: 11/11/16  4:53 PM  Result Value Ref Range Status   Specimen Description   Final    BLOOD LEFT ANTECUBITAL Performed at [facility]    Special Requests   Final    BOTTLES DRAWN AEROBIC AND ANAEROBIC AER12CC [id number]   Culture   Setup Time   Final    Organism ID to follow GRAM POSITIVE COCCI IN BOTH AEROBIC AND ANAEROBIC BOTTLES CRITICAL RESULT CALLED TO, READ BACK BY AND VERIFIED WITH: KAREN HAYES 11/12/16 0806 SGD    Culture STREPTOCOCCUS PNEUMONIAE (A)  Final   Report Status 11/14/2016 FINAL  Final   Organism ID, Bacteria STREPTOCOCCUS PNEUMONIAE  Final      Susceptibility   Streptococcus pneumoniae - MIC*    ERYTHROMYCIN >=8 RESISTANT Resistant     LEVOFLOXACIN 1 SENSITIVE Sensitive     PENICILLIN <=0.06 SENSITIVE Sensitive     CEFTRIAXONE <=0.12 SENSITIVE Sensitive     * STREPTOCOCCUS PNEUMONIAE  Blood Culture ID Panel (Reflexed)     Status: Abnormal   Collection Time: 11/11/16  4:53 PM  Result Value Ref Range Status   Enterococcus species NOT DETECTED NOT DETECTED Final   Listeria monocytogenes NOT DETECTED NOT DETECTED Final   Staphylococcus species NOT DETECTED NOT DETECTED Final   Staphylococcus aureus NOT DETECTED NOT DETECTED Final   Streptococcus species DETECTED (A) NOT DETECTED Final    Comment: CRITICAL RESULT CALLED TO, READ BACK BY AND VERIFIED WITH: KAREN HAYES 11/12/16 0806 SGD    Streptococcus agalactiae NOT DETECTED NOT DETECTED Final   Streptococcus pneumoniae DETECTED (A) NOT DETECTED Final    Comment: CRITICAL RESULT CALLED TO, READ BACK BY AND VERIFIED WITH: KAREN HAYES 11/12/16 0806 SGD    Streptococcus pyogenes NOT DETECTED NOT DETECTED Final   Acinetobacter baumannii NOT DETECTED NOT  DETECTED Final   Enterobacteriaceae species NOT DETECTED NOT DETECTED Final   Enterobacter cloacae complex NOT DETECTED NOT DETECTED Final   Escherichia coli NOT DETECTED NOT DETECTED Final   Klebsiella oxytoca NOT DETECTED NOT DETECTED Final   Klebsiella pneumoniae NOT DETECTED NOT DETECTED Final   Proteus species NOT DETECTED NOT DETECTED Final   Serratia marcescens NOT DETECTED NOT DETECTED Final   Haemophilus influenzae NOT DETECTED NOT DETECTED Final   Neisseria meningitidis NOT DETECTED  NOT DETECTED Final   Pseudomonas aeruginosa NOT DETECTED NOT DETECTED Final   Candida albicans NOT DETECTED NOT DETECTED Final   Candida glabrata NOT DETECTED NOT DETECTED Final   Candida krusei NOT DETECTED NOT DETECTED Final   Candida parapsilosis NOT DETECTED NOT DETECTED Final   Candida tropicalis NOT DETECTED NOT DETECTED Final  Culture, expectorated sputum-assessment     Status: None   Collection Time: 11/12/16  5:43 PM  Result Value Ref Range Status   Specimen Description SPU  Final   Special Requests NONE  Final   Sputum evaluation THIS SPECIMEN IS ACCEPTABLE FOR SPUTUM CULTURE  Final   Report Status 11/12/2016 FINAL  Final  Culture, respiratory (NON-Expectorated)     Status: None   Collection Time: 11/12/16  5:43 PM  Result Value Ref Range Status   Specimen Description SPU  Final   Special Requests NONE Reflexed from [id number]  Final   Gram Stain   Final    ABUNDANT WBC PRESENT,BOTH PMN AND MONONUCLEAR FEW GRAM NEGATIVE RODS FEW GRAM POSITIVE RODS RARE GRAM NEGATIVE COCCI IN PAIRS RARE GRAM POSITIVE COCCI IN PAIRS    Culture   Final    Consistent with normal respiratory flora. Performed at [facility]    Report Status 11/15/2016 FINAL  Final    RADIOLOGY:  Dg Chest 1 View  Result Date: 11/11/2016 CLINICAL DATA:  Two days of shortness of breath. Two weeks of abdominal pain. Currently has a productive cough. Long history of smoking. EXAM: CHEST 1 VIEW COMPARISON:  Portable chest x-ray of October 16, 2016 FINDINGS: The lungs remain hyperinflated. The interstitial markings are increased bilaterally. The cardiac silhouette is further enlarged. The retrocardiac density on the left has increased. The pulmonary vascularity is engorged. There is calcification in the wall of the aortic arch. IMPRESSION: CHF with mild interstitial edema more conspicuous than on the previous study. New left lower lobe atelectasis or pneumonia and small left pleural effusion. Followup PA and  lateral chest X-ray is recommended in 3-4 weeks following trial of antibiotic therapy to ensure resolution and exclude underlying malignancy. Electronically Signed   By: David  Martinique M.D.   On: 11/11/2016 16:32   US Abdomen Complete  Result Date: 11/12/2016 CLINICAL DATA:  Abdominal pain . EXAM: ABDOMEN ULTRASOUND COMPLETE COMPARISON:  CT 09/24/2016. FINDINGS: Gallbladder: Gallbladder is distended with sludge and gallstones. Largest gallstone measures 2.5 cm. Gallbladder wall thickness 2.2 mm. Negative Murphy sign Common bile duct: Diameter: 5.7 mm Liver: No focal lesion identified. Within normal limits in parenchymal echogenicity. IVC: No abnormality visualized. Pancreas: Visualized portion unremarkable. Spleen: Size and appearance within normal limits. Right Kidney: Length: 12.9 cm. Echogenicity within normal limits. No mass or hydronephrosis visualized. Left Kidney: Length: 13.6 cm. Echogenicity within normal limits. No mass or hydronephrosis visualized. Abdominal aorta: No aneurysm visualized. Other findings: None. IMPRESSION: 1. Gall bladder is distended with sludge and gallstones. No evidence cholecystitis. 2.  Exam is otherwise unremarkable. Electronically Signed   By: Marcello Moores  Register   On: 11/12/2016  08:36    EKG:   Orders placed or performed during the hospital encounter of 11/11/16  . ED EKG  . ED EKG  . EKG 12-lead      Management plans discussed with the patient, family and they are in agreement.  CODE STATUS:     Code Status Orders        Start     Ordered   11/11/16 2225  Full code  Continuous     11/11/16 2224    Code Status History    Date Active Date Inactive Code Status Order ID Comments User Context   10/16/2016  2:11 PM 10/18/2016  6:52 PM Full Code [id number]  Benjaman Kindler, MD Inpatient      TOTAL TIME TAKING CARE OF THIS PATIENT: 70minutes.   Note: This dictation was prepared with Dragon dictation along with smaller phrase technology. Any transcriptional  errors that result from this process are unintentional.   @[facility] @  on 11/15/2016 at 1:49 PM  Between 7am to 6pm - Pager - [phone]  After 6pm go to [email or web address] - password [email or web address] [facility]  Office  [phone]  CC: Primary care physician; Dion Body, MD

## 2016-11-15 NOTE — Progress Notes (Signed)
Discharge instruction explained to pt/ verbalized an understanding/ iv and tele removed/ transported off unit via wheelchair.

## 2016-11-15 NOTE — Plan of Care (Signed)
Problem: Activity: Goal: Ability to tolerate increased activity will improve Outcome: Not Progressing Dyspnea on exertion continues when up to bedside commode.  O2 continues at [facility].

## 2016-11-15 NOTE — Plan of Care (Signed)
Problem: Cardiac: Goal: Ability to achieve and maintain adequate cardiopulmonary perfusion will improve Outcome: Progressing Atrial fib with controlled rates continue.  Patient reports small amount rectal bleed from internal hemorrhoids.  Will continue to monitor.

## 2016-11-18 ENCOUNTER — Ambulatory Visit: Admission: RE | Admit: 2016-11-18 | Payer: Medicare HMO | Source: Ambulatory Visit | Admitting: General Surgery

## 2016-11-18 ENCOUNTER — Encounter: Admission: RE | Payer: Self-pay | Source: Ambulatory Visit

## 2016-11-18 SURGERY — LAPAROSCOPIC CHOLECYSTECTOMY WITH INTRAOPERATIVE CHOLANGIOGRAM
Anesthesia: Choice

## 2016-11-26 ENCOUNTER — Ambulatory Visit: Payer: Medicare HMO | Admitting: Family

## 2016-11-26 ENCOUNTER — Telehealth: Payer: Self-pay | Admitting: Family

## 2016-11-26 NOTE — Telephone Encounter (Signed)
Patient missed her initial appointment at the [facility] on 11/26/16. Will attempt to reschedule.

## 2016-12-03 NOTE — Progress Notes (Signed)
Gynecologic Oncology Interval Visit   Referring Provider: Boykin Nearing, MD [address] [facility] [place], [place] [address] [phone]   Chief Concern: postoperative visit for endometrial cancer  Subjective:  Jessica Dalton is a 65 y.o. female who is seen in consultation from Dr. Ouida Sills for grade 1 endometrial cancer.  On 10/16/2016 she underwent RA-TLH BSO and SLN injection, mapping and biopsy. Her intraoperative and postoperative course was significant for pulmonary issues. She had desaturation due to chronic pulmonary disease from smoking.   A preoperative CT scan A/P revealed the following: IMPRESSION: 1. Single borderline enlarged portal hepatic lymph node. No additional findings suspicious for or suggestive of metastatic disease. Further imaging with PET-CT may be helpful to assess for hyper metabolism within this lymph node. 2. Left adrenal nodule, indeterminate. This could be better assessed with adrenal protocol MRI or PET-CT. 3. Thickening of the endometrium which in a postmenopausal female is considered abnormal and consistent with the clinical history of endometrial sarcoma. Stones identified within the gallbladder are noted measuring up to 3 cm.   I reviewed the findings preoperatively and spoke with Dr. Bary Castilla to see if he could be available for cholecystectomy as she was symptomatic with the gallstones. Unfortunately he was not available but recommended postoperative referral.  Preop CXR:  IMPRESSION: Hyperinflation and mild diffuse interstitial prominence consistent with the history of reactive airway disease. There is no pneumonia, CHF, nor other acute cardiopulmonary abnormality.  Aortic atherosclerosis.  Pathology noted as below:  Cytology: Negative  DIAGNOSIS:  A. SENTINEL LYMPH NODE, LEFT; EXCISION:  - NO TUMOR SEEN IN ONE LYMPH NODE (0/1).   B. SENTINEL LYMPH NODE, LEFT; EXCISION:  - NO TUMOR SEEN IN TWO LYMPH  NODES (0/2).   C. UTERUS WITH CERVIX, BILATERAL FALLOPIAN TUBES AND OVARIES;  HYSTERECTOMY WITH BILATERAL SALPINGO-OOPHORECTOMY:  - ENDOMETRIOID ADENOCARCINOMA FIGO I.  - SURFACE DEGENERATIVE PAPILLARY FEATURES.  - MYOMETRIAL INVASION IS NOT IDENTIFIED.  - CHRONIC CERVICITIS.  - STROMAL HYPERPLASIA OF BILATERAL OVARIES.  - NO PATHOLOGIC CHANGE, BILATERAL FALLOPIAN TUBES.  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    ENDOMETRIUM: Hysterectomy, With or Without Other Organs or Tissues  Specimens InvolvedA: Sentinel lymph node, left  B: Sentinel lymph node, right proximal obturator  C: Uterus with cervix, bilateral tubes and ovaries   Endometrium, Hysterectomy, With or Without Other Organs or Tissues  Cancer Case Summary  Specimen: Uterine corpus  SPECIMEN  Procedure  Simple hysterectomy  Additional Procedures:  Bilateral salpingo-oophorectomy  Lymph Node Sampling:   Performed  Pelvic lymph nodes  Specimen Integrity: Intact hysterectomy specimen  TUMOR  Histologic Type:  Endometrioid adenocarcinoma, not otherwise  characterized  Histologic Grade:  FIGO grade 1  EXTENT  Tumor Size:  Greatest dimension (cm)  4.2cm  Myometrial Invasion:   Not identified  Involvement of Cervix:  Not involved  Other Organs Submitted: Right ovary  Not involved  Left ovary  Not involved  Right fallopian tube  Not involved  Left fallopian tube  Not involved  ACCESSORY FINDINGS  Lymph-Vascular Invasion: Not identified  STAGE (pTNM [FIGO])  Primary Tumor (pT):  pT1a [IA]: Tumor limited to endometrium or invades less than one-half  of the myometrium  Regional Lymph Nodes (pN)  pN0: No regional lymph node metastasis  Pelvic Lymph Nodes: Number of Lymph Nodes Examined:  Specify  3  Number of Lymph Nodes Involved:  Specify  0  Para-aortic Lymph Nodes: No para-aortic nodes submitted or  found  Distant Metastasis (pM): Not applicable   Since  surgery she was seen by Dr. Bary Castilla on 11/06/2016 with the plan to proceed with Laparoscopic Cholecystectomy with Intraoperative Cholangiogram. She had a consult with Dr. Rogue Bussing for erythocytosis - work up still ongoing. She was admitted on 11/11/2016 throught 11/15/2016 with sepsis, pneumonia, COPD exacerbation,  dyspnea, and new onset atrial fibrillation. She is being treated by Dr. Netty Starring and Dr. Clayborn Bigness.  Her cholecystectomy was canceled due to her other comorbidities. She has multiple complaints as noted in review of systems.       Gynecologic Oncology History She was referred to Dr. Ouida Sills by Dr. Netty Starring for PMB.  grade 1 endometrial cancer. EMBx and Pap performed on 09/09/2016. Uterus sounded to 10 cm. 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 Pap NILM.   Dr. Ouida Sills also ordered a CT scan to evaluate upper abdominal pain symptoms.     Problem List: Patient Active Problem List   Diagnosis Date Noted  . Constipation 12/04/2016  . Atrial fibrillation with RVR ([place]) 11/11/2016  . Erythrocytosis 11/08/2016  . Cigarette smoker   . Gallstones 10/16/2016  . Endometrial cancer determined by uterine biopsy ([place]) 10/16/2016  . Endometrial cancer ([place]) 09/18/2016    Past Medical History: Past Medical History:  Diagnosis Date  . Anemia   . Asthma   . Cancer ([place])    uterine ca  . Endometrial cancer ([place])   . Gallstones 10/16/2016  . Obesity   . Tobacco use     Past Surgical History: Past Surgical History:  Procedure Laterality Date  . CATARACT EXTRACTION, BILATERAL  2015  . PELVIC LYMPH NODE DISSECTION N/A 10/16/2016   Procedure: PELVIC LYMPH NODE DISSECTION;  Surgeon: Gillis Ends, MD;  Location: [facility] ORS;  Service: Gynecology;  Laterality: N/A;  . ROBOTIC ASSISTED TOTAL HYSTERECTOMY WITH BILATERAL SALPINGO OOPHERECTOMY Bilateral 10/16/2016   Procedure: ROBOTIC ASSISTED TOTAL HYSTERECTOMY WITH BILATERAL SALPINGO OOPHORECTOMY;   Surgeon: Gillis Ends, MD;  Location: [facility] ORS;  Service: Gynecology;  Laterality: Bilateral;  . SENTINEL NODE BIOPSY N/A 10/16/2016   Procedure: SENTINEL NODE INJECTION;  Surgeon: Gillis Ends, MD;  Location: [facility] ORS;  Service: Gynecology;  Laterality: N/A;  . TUBAL LIGATION      Past Gynecologic History:  Menarche: unknown Menstrual details: postmenopausal Last Menstrual Period: 15 years ago History of OCP/HRT use: None History of Abnormal pap: no, benign cellular changes Last pap: as per HPI   OB History:  OB History  Gravida Para Term Preterm AB Living  3 3 3     3   SAB TAB Ectopic Multiple Live Births               # Outcome Date GA Lbr Len/2nd Weight Sex Delivery Anes PTL Lv  3 Term           2 Term           1 Term             Obstetric Comments  1st Menstrual Cycle:  ?   1st Pregnancy:  19      Family History: Family History  Problem Relation Age of Onset  . Cancer Mother     renal cell cancer early 62's  . Cancer Father     colon cancer late 47's   . Cancer Sister     melanoma  age 43; cervical vs endometrial cancer age?    Social History: Social History   Social History  .  Marital status: Married    Spouse name: N/A  . Number of children: N/A  . Years of education: N/A   Occupational History  . Not on file.   Social History Main Topics  . Smoking status: Current Every Day Smoker    Packs/day: 0.50    Years: 50.00  . Smokeless tobacco: Never Used     Comment: 75 pk yr - 1.5 ppd; 65 years; started age 72  . Alcohol use No  . Drug use: No  . Sexual activity: Not on file   Other Topics Concern  . Not on file   Social History Narrative  . No narrative on file    Allergies: No Known Allergies  Current Medications: Current Outpatient Prescriptions  Medication Sig Dispense Refill  . acetaminophen (TYLENOL) 500 MG tablet Take 1,500 mg by mouth daily as needed for mild pain.    [name] albuterol (PROVENTIL HFA;VENTOLIN HFA)  108 (90 Base) MCG/ACT inhaler Inhale 2 puffs into the lungs every 6 (six) hours as needed for wheezing or shortness of breath. 1 Inhaler 1  . apixaban (ELIQUIS) 5 MG TABS tablet Take by mouth.    [name] aspirin EC 81 MG EC tablet Take 1 tablet (81 mg total) by mouth daily.    . calcium carbonate (TUMS - DOSED IN MG ELEMENTAL CALCIUM) 500 MG chewable tablet Chew 1 tablet (200 mg of elemental calcium total) by mouth 2 (two) times daily.    [name] diltiazem (CARDIZEM CD) 120 MG 24 hr capsule Take 1 capsule (120 mg total) by mouth daily. 30 capsule 0  . docusate sodium (COLACE) 100 MG capsule Take 1 capsule (100 mg total) by mouth daily as needed for mild constipation. 60 capsule 3  . furosemide (LASIX) 20 MG tablet Take 1 tablet (20 mg total) by mouth 2 (two) times daily. 60 tablet 0  . ibuprofen (ADVIL,MOTRIN) 200 MG tablet Take 400 mg by mouth every 6 (six) hours as needed for headache or moderate pain.    . polyethylene glycol (MIRALAX / GLYCOLAX) packet Take 17 g by mouth daily as needed for mild constipation or moderate constipation.    . potassium chloride (K-DUR) 10 MEQ tablet Take 1 tablet (10 mEq total) by mouth daily. 30 tablet 0  . traMADol (ULTRAM) 50 MG tablet Take 1 tablet (50 mg total) by mouth every 6 (six) hours as needed for moderate pain. 30 tablet 0  . umeclidinium-vilanterol (ANORO ELLIPTA) 62.5-25 MCG/INH AEPB Inhale 1 puff into the lungs daily. 60 each 10  . nicotine (NICODERM CQ - DOSED IN MG/24 HOURS) 14 mg/24hr patch Place 1 patch (14 mg total) onto the skin daily. (Patient not taking: Reported on 12/04/2016) 30 patch 10  . nicotine (NICOTROL) 10 MG inhaler Inhale 1 Cartridge (1 continuous puffing total) into the lungs as needed for smoking cessation. (Patient not taking: Reported on 12/04/2016) 42 each 10  . predniSONE (STERAPRED UNI-PAK 21 TAB) 10 MG (21) TBPK tablet Take 1 tablet (10 mg total) by mouth daily. Take 6 tablets by mouth for 1 day followed by  5 tablets by mouth for 1 day  followed by  4 tablets by mouth for 1 day followed by  3 tablets by mouth for 1 day followed by  2 tablets by mouth for 1 day followed by  1 tablet by mouth for a day and stop (Patient not taking: Reported on 12/04/2016) 21 tablet 0   No current facility-administered medications for this visit.     Review  of Systems General: fatigue, weakness, decreased appetite  HEENT: no complaints  Lungs: cough resolved; shortness of breath, decreased smoking to 4 cigs a day  Cardiac: a-fib resolved  GI: constipation/persistent diffuse abdominal pain in the RUQ similar to symptoms she had before surgery. She describes it as 'numbness, and stinging pain in her abdomen'. She had difficulty with constipation since before surgery. She is using colace BID, occasional Miralax, and high fiber diet. She has days since bowel movements.   GU: no bleeding  Musculoskeletal: back pain  Extremities: no complaints  Skin: no complaints  Neuro: weakness,   Endocrine: no complaints  Psych: no complaints       Objective:  Physical Examination:  BP (!) 151/78 (BP Location: Right Wrist, Patient Position: Sitting)   Pulse 93   Ht 5\' 1"  (1.549 m)   Wt 218 lb 7.6 oz (99.1 kg)   BMI 41.28 kg/m    ECOG Performance Status: 0 - Asymptomatic  General appearance: alert, cooperative and appears stated age HEENT:PERRLA, extra ocular movement intact and sclera clear, anicteric. Abdomen: soft, appropriately tender mostly in RUQ, without masses or organomegaly and no hernias. Obese.  Back: inspection of back is normal Extremities: extremities normal, atraumatic, no cyanosis or edema  Skin: all incisions are well healed, no erythema or drainage Neurological exam reveals alert, oriented, normal speech, no focal findings or movement disorder noted.  Pelvic: exam chaperoned by nurse;  Vulva: normal appearing vulva with no masses, tenderness or lesions; Vagina: normal vagina, vaginal cuff closed and no sutures visible.   Uterus/cervix: surgically absent. BME: negative.  Rectal: not indicated   Lab Review none  Radiologic Imaging: none    Assessment:  Jessica Dalton is a 65 y.o. female diagnosed with stage 1A, grade 1 endometrioid endometrial cancer. Medical co-morbidities complicating care: asthma, tobacco use, and obesity. Additional issues include recent hospitalization for sepsis, pneumonia, COPD exacerbation,  dyspnea, and atrial fibrillation, now resolved. Symptomatic cholelithiasis. Constipation.  Plan:   Problem List Items Addressed This Visit      Digestive   Constipation     Genitourinary   Endometrial cancer ([place]) - Primary     With regard to endometrial cancer she has an excellent prognosis with early stage disease. Her risk of recurrence is less than 10%. I have recommended continued close follow up with exams, including pelvic exams every 3-6 months for 2-3 years, then every 6-12 months for 3-5 years and then annually thereafter.  We will alternate visits with Dr. Ouida Sills. She will see [place] again in 3 months and then we can start alternating with Dr. Ouida Sills.   Imaging and laboratory assessment is based on clinical indication. Patient education for obesity, lifestyle, exercise, nutrition, smoking cessation, sexual health, vaginal lubricants was provided. We discussed her weight and need for weight loss, stressed good nutrition, and exercise.  I provided information regarding calorie counting and exercise for weight loss. I offered referral to the [facility]ARE program and provided a pamphlet today.  We discussed how difficult it will be for her to follow these programs given all her current medical issues and I encouraged her to do her best.   I also provided information regarding constipation. I recommended using Miralax at least 1 capful every evening. If she does have diarrhea she can decrease the amount but do not stop completely as then she will have alternating diarrhea and constipation.  She can titrate the dose as needed. She will continue Colace BID and I also encourage water intake and  fiber rich cereals. She will continue to follow with [place] and her other providers for her other medical issues.   The patient's diagnosis, an outline of the further diagnostic and laboratory studies which will be required, the recommendation, and alternatives were discussed.  All questions were answered to the patient's satisfaction. I addressed her husband's concerns about her recovery. I think her symptoms are not from her surgery but rather due to her other health related issues.   A total of 40 minutes were spent in education, counseling and coordination of care for endometrial cancer.    Gillis Ends, MD    CC:  Boykin Nearing, MD [address] [facility] [place], [place] [address] [phone]  Dr. Dion Body

## 2016-12-04 ENCOUNTER — Inpatient Hospital Stay: Payer: Medicare HMO | Admitting: Obstetrics and Gynecology

## 2016-12-04 ENCOUNTER — Encounter: Payer: Self-pay | Admitting: Obstetrics and Gynecology

## 2016-12-04 VITALS — BP 151/78 | HR 93 | Ht 61.0 in | Wt 218.5 lb

## 2016-12-04 DIAGNOSIS — K5900 Constipation, unspecified: Secondary | ICD-10-CM | POA: Insufficient documentation

## 2016-12-04 DIAGNOSIS — C541 Malignant neoplasm of endometrium: Secondary | ICD-10-CM

## 2016-12-04 NOTE — Progress Notes (Signed)
  Oncology Nurse Navigator Documentation Chaperoned pelvic exam.  Navigator Location: [facility] (12/04/16 1100)   )Navigator Encounter Type: Follow-up Appt (post op) (12/04/16 1100)                     Patient Visit Type: GynOnc (12/04/16 1100)

## 2016-12-04 NOTE — Patient Instructions (Signed)
Constipation, Adult Constipation is when a person:  Poops (has a bowel movement) fewer times in a week than normal.  Has a hard time pooping.  Has poop that is dry, hard, or bigger than normal. Follow these instructions at home: Eating and drinking  Eat foods that have a lot of fiber, such as:  Fresh fruits and vegetables.  Whole grains.  Beans.  Eat less of foods that are high in fat, low in fiber, or overly processed, such as:  [place] fries.  Hamburgers.  Cookies.  Candy.  Soda.  Drink enough fluid to keep your pee (urine) clear or pale yellow. General instructions  Exercise regularly or as told by your doctor.  Go to the restroom when you feel like you need to poop. Do not hold it in.  Take over-the-counter and prescription medicines only as told by your doctor. These include any fiber supplements.  Do pelvic floor retraining exercises, such as:  Doing deep breathing while relaxing your lower belly (abdomen).  Relaxing your pelvic floor while pooping.  Watch your condition for any changes.  Keep all follow-up visits as told by your doctor. This is important. Contact a doctor if:  You have pain that gets worse.  You have a fever.  You have not pooped for 4 days.  You throw up (vomit).  You are not hungry.  You lose weight.  You are bleeding from the anus.  You have thin, pencil-like poop (stool). Get help right away if:  You have a fever, and your symptoms suddenly get worse.  You leak poop or have blood in your poop.  Your belly feels hard or bigger than normal (is bloated).  You have very bad belly pain.  You feel dizzy or you faint. This information is not intended to replace advice given to you by your health care provider. Make sure you discuss any questions you have with your health care provider. Document Released: 05/13/2008 Document Revised: 06/14/2016 Document Reviewed: 05/15/2016 Elsevier Interactive Patient Education  2017  [place] Continued close follow up with exams, including pelvic exams every 3-6 months for 2-3 years, then every 6-12 months for 3-5 years and then annually thereafter. Alternating exams with Dr. Ouida Sills.  Imaging and laboratory assessment is based on clinical indication. Patient education for obesity, lifestyle, exercise, nutrition, smoking cessation, sexual health, vaginal lubricants. Watch your weight and need for weight loss, stressed good nutrition, and exercise.    Calorie Counting for Weight Loss Calories are energy you get from the things you eat and drink. Your body uses this energy to keep you going throughout the day. The number of calories you eat affects your weight. When you eat more calories than your body needs, your body stores the extra calories as fat. When you eat fewer calories than your body needs, your body burns fat to get the energy it needs. Calorie counting means keeping track of how many calories you eat and drink each day. If you make sure to eat fewer calories than your body needs, you should lose weight. In order for calorie counting to work, you will need to eat the number of calories that are right for you in a day to lose a healthy amount of weight per week. A healthy amount of weight to lose per week is usually 1-2 lb (0.5-0.9 kg). A dietitian can determine how many calories you need in a day and give you suggestions on how to reach your calorie goal.  WHAT DO I NEED TO KNOW ABOUT CALORIE COUNTING? In order to meet your daily calorie goal, you will need to:  Find out how many calories are in each food you would like to eat. Try to do this before you eat.  Decide how much of the food you can eat.  Write down what you ate and how many calories it had. Doing this is called keeping a food log. WHERE DO I FIND CALORIE INFORMATION? The number of calories in a food can be found on a Nutrition Facts label. Note that all the information on a  label is based on a specific serving of the food. If a food does not have a Nutrition Facts label, try to look up the calories online or ask your dietitian for help. HOW DO I DECIDE HOW MUCH TO EAT? To decide how much of the food you can eat, you will need to consider both the number of calories in one serving and the size of one serving. This information can be found on the Nutrition Facts label. If a food does not have a Nutrition Facts label, look up the information online or ask your dietitian for help. Remember that calories are listed per serving. If you choose to have more than one serving of a food, you will have to multiply the calories per serving by the amount of servings you plan to eat. For example, the label on a package of bread might say that a serving size is 1 slice and that there are 90 calories in a serving. If you eat 1 slice, you will have eaten 90 calories. If you eat 2 slices, you will have eaten 180 calories. HOW DO I KEEP A FOOD LOG? After each meal, record the following information in your food log:  What you ate.  How much of it you ate.  How many calories it had.  Then, add up your calories. Keep your food log near you, such as in a small notebook in your pocket. Another option is to use a mobile app or website. Some programs will calculate calories for you and show you how many calories you have left each time you add an item to the log. WHAT ARE SOME CALORIE COUNTING TIPS?  Use your calories on foods and drinks that will fill you up and not leave you hungry. Some examples of this include foods like nuts and nut butters, vegetables, lean proteins, and high-fiber foods (more than 5 g fiber per serving).  Eat nutritious foods and avoid empty calories. Empty calories are calories you get from foods or beverages that do not have many nutrients, such as candy and soda. It is better to have a nutritious high-calorie food (such as an avocado) than a food with few nutrients  (such as a bag of chips).  Know how many calories are in the foods you eat most often. This way, you do not have to look up how many calories they have each time you eat them.  Look out for foods that may seem like low-calorie foods but are really high-calorie foods, such as baked goods, soda, and fat-free candy.  Pay attention to calories in drinks. Drinks such as sodas, specialty coffee drinks, alcohol, and juices have a lot of calories yet do not fill you up. Choose low-calorie drinks like water and diet drinks.  Focus your calorie counting efforts on higher calorie items. Logging the calories in a garden salad that contains only vegetables is less important than  calculating the calories in a milk shake.  Find a way of tracking calories that works for you. Get creative. Most people who are successful find ways to keep track of how much they eat in a day, even if they do not count every calorie. WHAT ARE SOME PORTION CONTROL TIPS?  Know how many calories are in a serving. This will help you know how many servings of a certain food you can have.  Use a measuring cup to measure serving sizes. This is helpful when you start out. With time, you will be able to estimate serving sizes for some foods.  Take some time to put servings of different foods on your favorite plates, bowls, and cups so you know what a serving looks like.  Try not to eat straight from a bag or box. Doing this can lead to overeating. Put the amount you would like to eat in a cup or on a plate to make sure you are eating the right portion.  Use smaller plates, glasses, and bowls to prevent overeating. This is a quick and easy way to practice portion control. If your plate is smaller, less food can fit on it.  Try not to multitask while eating, such as watching TV or using your computer. If it is time to eat, sit down at a table and enjoy your food. Doing this will help you to start recognizing when you are full. It will also  make you more aware of what and how much you are eating. HOW CAN I CALORIE COUNT WHEN EATING OUT?  Ask for smaller portion sizes or child-sized portions.  Consider sharing an entree and sides instead of getting your own entree.  If you get your own entree, eat only half. Ask for a box at the beginning of your meal and put the rest of your entree in it so you are not tempted to eat it.  Look for the calories on the menu. If calories are listed, choose the lower calorie options.  Choose dishes that include vegetables, fruits, whole grains, low-fat dairy products, and lean protein. Focusing on smart food choices from each of the 5 food groups can help you stay on track at restaurants.  Choose items that are boiled, broiled, grilled, or steamed.  Choose water, milk, unsweetened iced tea, or other drinks without added sugars. If you want an alcoholic beverage, choose a lower calorie option. For example, a regular margarita can have up to 700 calories and a glass of wine has around 150.  Stay away from items that are buttered, battered, fried, or served with cream sauce. Items labeled "crispy" are usually fried, unless stated otherwise.  Ask for dressings, sauces, and syrups on the side. These are usually very high in calories, so do not eat much of them.  Watch out for salads. Many people think salads are a healthy option, but this is often not the case. Many salads come with bacon, fried chicken, lots of cheese, fried chips, and dressing. All of these items have a lot of calories. If you want a salad, choose a garden salad and ask for grilled meats or steak. Ask for the dressing on the side, or ask for olive oil and vinegar or lemon to use as dressing.  Estimate how many servings of a food you are given. For example, a serving of cooked rice is  cup or about the size of half a tennis ball or one cupcake wrapper. Knowing serving sizes will help you  be aware of how much food you are eating at  restaurants. The list below tells you how big or small some common portion sizes are based on everyday objects.  1 oz-4 stacked dice.  3 oz-1 deck of cards.  1 tsp-1 dice.  1 Tbsp- a Ping-Pong ball.  2 Tbsp-1 Ping-Pong ball.   cup-1 tennis ball or 1 cupcake wrapper.  1 cup-1 baseball. This information is not intended to replace advice given to you by your health care provider. Make sure you discuss any questions you have with your health care provider. Document Released: 11/25/2005 Document Revised: 12/16/2014 Document Reviewed: 09/30/2013 Elsevier Interactive Patient Education  2017 [employer].  Exercising to [employer] Introduction Exercising can help you to lose weight. In order to lose weight through exercise, you need to do vigorous-intensity exercise. You can tell that you are exercising with vigorous intensity if you are breathing very hard and fast and cannot hold a conversation while exercising. Moderate-intensity exercise helps to maintain your current weight. You can tell that you are exercising at a moderate level if you have a higher heart rate and faster breathing, but you are still able to hold a conversation. How often should I exercise? Choose an activity that you enjoy and set realistic goals. Your health care provider can help you to make an activity plan that works for you. Exercise regularly as directed by your health care provider. This may include:  Doing resistance training twice each week, such as:  Push-ups.  Sit-ups.  Lifting weights.  Using resistance bands.  Doing a given intensity of exercise for a given amount of time. Choose from these options:  150 minutes of moderate-intensity exercise every week.  75 minutes of vigorous-intensity exercise every week.  A mix of moderate-intensity and vigorous-intensity exercise every week. Children, pregnant women, people who are out of shape, people who are overweight, and older adults may need to  consult a health care provider for individual recommendations. If you have any sort of medical condition, be sure to consult your health care provider before starting a new exercise program. What are some activities that can help me to lose weight?  Walking at a rate of at least 4.5 miles an hour.  Jogging or running at a rate of 5 miles per hour.  Biking at a rate of at least 10 miles per hour.  Lap swimming.  Roller-skating or in-line skating.  Cross-country skiing.  Vigorous competitive sports, such as football, basketball, and soccer.  Jumping rope.  Aerobic dancing. How can I be more active in my day-to-day activities?  Use the stairs instead of the elevator.  Take a walk during your lunch break.  If you drive, park your car farther away from work or school.  If you take public transportation, get off one stop early and walk the rest of the way.  Make all of your phone calls while standing up and walking around.  Get up, stretch, and walk around every 30 minutes throughout the day. What guidelines should I follow while exercising?  Do not exercise so much that you hurt yourself, feel dizzy, or get very short of breath.  Consult your health care provider prior to starting a new exercise program.  Wear comfortable clothes and shoes with good support.  Drink plenty of water while you exercise to prevent dehydration or heat stroke. Body water is lost during exercise and must be replaced.  Work out until you breathe faster and your heart beats faster.  This information is not intended to replace advice given to you by your health care provider. Make sure you discuss any questions you have with your health care provider. Document Released: 12/28/2010 Document Revised: 05/02/2016 Document Reviewed: 04/28/2014  2017 Elsevier

## 2016-12-04 NOTE — Progress Notes (Signed)
Patient here for follow up. She is not feeling well constant abdominal pain, constipation, bloated, poor appetite, sleep pattern is disrupted.

## 2016-12-11 ENCOUNTER — Inpatient Hospital Stay: Payer: Medicare HMO | Attending: Internal Medicine | Admitting: Internal Medicine

## 2016-12-11 DIAGNOSIS — Z90710 Acquired absence of both cervix and uterus: Secondary | ICD-10-CM | POA: Insufficient documentation

## 2016-12-11 DIAGNOSIS — Z79899 Other long term (current) drug therapy: Secondary | ICD-10-CM | POA: Diagnosis not present

## 2016-12-11 DIAGNOSIS — J440 Chronic obstructive pulmonary disease with acute lower respiratory infection: Secondary | ICD-10-CM | POA: Insufficient documentation

## 2016-12-11 DIAGNOSIS — Z7982 Long term (current) use of aspirin: Secondary | ICD-10-CM | POA: Insufficient documentation

## 2016-12-11 DIAGNOSIS — E669 Obesity, unspecified: Secondary | ICD-10-CM | POA: Diagnosis not present

## 2016-12-11 DIAGNOSIS — C541 Malignant neoplasm of endometrium: Secondary | ICD-10-CM

## 2016-12-11 DIAGNOSIS — Z90722 Acquired absence of ovaries, bilateral: Secondary | ICD-10-CM | POA: Diagnosis not present

## 2016-12-11 DIAGNOSIS — F17210 Nicotine dependence, cigarettes, uncomplicated: Secondary | ICD-10-CM

## 2016-12-11 DIAGNOSIS — D751 Secondary polycythemia: Secondary | ICD-10-CM | POA: Diagnosis not present

## 2016-12-11 NOTE — Progress Notes (Signed)
Patient here for referral. She is  In constant abdominal pain from gall bladder. Surgery on hold until respiratory infection clears.

## 2016-12-11 NOTE — Progress Notes (Signed)
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     HISTORY OF PRESENTING ILLNESS:  Jessica Dalton 66 y.o.  female patient history of smoking obesity and also COPD noted to have elevated hemoglobin up to 17 hematocrit 52 approximately a month ago with the PCPs office. She denies any unusual headaches or vision changes. Denies any history of blood clots or other thrombotic complications. Chronic mild fatigue.  Patient also recently diagnosed with endometrial cancer stage I status post TAH/BSO. No adjuvant therapy recommended. Patient is awaiting a gallbladder surgery. She was recently discharged from the hospital with Pneumonia and was send home with Augmentin and home oxygen. She is currently feeling better.   ROS: A complete 10 point review of system is done which is negative except mentioned above in history of present illness  MEDICAL HISTORY:  Past Medical History:  Diagnosis Date  . Anemia   . Asthma   . Cancer ([place])    uterine ca  . Endometrial cancer ([place])   . Gallstones 10/16/2016  . Obesity   . Tobacco use     SURGICAL HISTORY: Past Surgical History:  Procedure Laterality Date  . CATARACT EXTRACTION, BILATERAL  2015  . PELVIC LYMPH NODE DISSECTION N/A 10/16/2016   Procedure: PELVIC LYMPH NODE DISSECTION;  Surgeon: Gillis Ends, MD;  Location: [facility] ORS;  Service: Gynecology;  Laterality: N/A;  . ROBOTIC ASSISTED TOTAL HYSTERECTOMY WITH BILATERAL SALPINGO OOPHERECTOMY Bilateral 10/16/2016   Procedure: ROBOTIC ASSISTED TOTAL  HYSTERECTOMY WITH BILATERAL SALPINGO OOPHORECTOMY;  Surgeon: Gillis Ends, MD;  Location: [facility] ORS;  Service: Gynecology;  Laterality: Bilateral;  . SENTINEL NODE BIOPSY N/A 10/16/2016   Procedure: SENTINEL NODE INJECTION;  Surgeon: Gillis Ends, MD;  Location: [facility] ORS;  Service: Gynecology;  Laterality: N/A;  . TUBAL LIGATION      SOCIAL HISTORY: smoking/cuting down.  Social History   Social History  . Marital status: Married    Spouse name: N/A  . Number of children: N/A  . Years of education: N/A   Occupational History  . Not on file.   Social History Main Topics  . Smoking status: Current Every Day Smoker    Packs/day: 0.50    Years: 50.00  . Smokeless tobacco: Never Used     Comment: 75 pk yr - 1.5 ppd; 34 years; started age 53  . Alcohol use No  . Drug use: No  . Sexual activity: Not on file   Other Topics Concern  . Not on file   Social History Narrative  . No narrative on file    FAMILY HISTORY: Family History  Problem Relation Age of Onset  . Cancer Mother     renal cell cancer early 60's  . Cancer Father     colon cancer late 51's   . Cancer Sister     melanoma  age 1; cervical vs endometrial cancer age?    ALLERGIES:  has No Known Allergies.  MEDICATIONS:  Current Outpatient Prescriptions  Medication Sig Dispense Refill  . acetaminophen (TYLENOL) 500 MG tablet Take 1,500 mg by mouth daily as needed for mild pain.    [name]  albuterol (PROVENTIL HFA;VENTOLIN HFA) 108 (90 Base) MCG/ACT inhaler Inhale 2 puffs into the lungs every 6 (six) hours as needed for wheezing or shortness of breath. 1 Inhaler 1  . apixaban (ELIQUIS) 5 MG TABS tablet Take by mouth.    [name] aspirin EC 81 MG EC tablet Take 1 tablet (81 mg total) by mouth daily.    . calcium carbonate (TUMS - DOSED IN MG ELEMENTAL CALCIUM) 500 MG chewable tablet Chew 1 tablet (200 mg of elemental calcium total) by mouth 2 (two) times daily.    [name] diltiazem (CARDIZEM CD) 120 MG 24 hr  capsule Take 1 capsule (120 mg total) by mouth daily. 30 capsule 0  . docusate sodium (COLACE) 100 MG capsule Take 1 capsule (100 mg total) by mouth daily as needed for mild constipation. 60 capsule 3  . furosemide (LASIX) 20 MG tablet Take 1 tablet (20 mg total) by mouth 2 (two) times daily. 60 tablet 0  . ibuprofen (ADVIL,MOTRIN) 200 MG tablet Take 400 mg by mouth every 6 (six) hours as needed for headache or moderate pain.    . nicotine (NICODERM CQ - DOSED IN MG/24 HOURS) 14 mg/24hr patch Place 1 patch (14 mg total) onto the skin daily. 30 patch 10  . nicotine (NICOTROL) 10 MG inhaler Inhale 1 Cartridge (1 continuous puffing total) into the lungs as needed for smoking cessation. 42 each 10  . polyethylene glycol (MIRALAX / GLYCOLAX) packet Take 17 g by mouth daily as needed for mild constipation or moderate constipation.    . potassium chloride (K-DUR) 10 MEQ tablet Take 1 tablet (10 mEq total) by mouth daily. 30 tablet 0  . predniSONE (STERAPRED UNI-PAK 21 TAB) 10 MG (21) TBPK tablet Take 1 tablet (10 mg total) by mouth daily. Take 6 tablets by mouth for 1 day followed by  5 tablets by mouth for 1 day followed by  4 tablets by mouth for 1 day followed by  3 tablets by mouth for 1 day followed by  2 tablets by mouth for 1 day followed by  1 tablet by mouth for a day and stop 21 tablet 0  . traMADol (ULTRAM) 50 MG tablet Take 1 tablet (50 mg total) by mouth every 6 (six) hours as needed for moderate pain. 30 tablet 0  . umeclidinium-vilanterol (ANORO ELLIPTA) 62.5-25 MCG/INH AEPB Inhale 1 puff into the lungs daily. 60 each 10   No current facility-administered medications for this visit.       [name]  PHYSICAL EXAMINATION: ECOG PERFORMANCE STATUS: 0 - Asymptomatic  Vitals:   12/11/16 1128  BP: 114/71  Pulse: 99  Temp: 98 F (36.7 C)   Filed Weights   12/11/16 1128  Weight: 217 lb 6.4 oz (98.6 kg)    GENERAL: Well-nourished well-developed; Alert, no distress and comfortable.    Alone. Obese.  EYES: no pallor or icterus OROPHARYNX: no thrush or ulceration; good dentition  NECK: supple, no masses felt LYMPH:  no palpable lymphadenopathy in the cervical, axillary or inguinal regions LUNGS: Diminished breath sounds bilaterally.  No wheeze or crackles HEART/CVS: regular rate & rhythm and no murmurs; No lower extremity edema ABDOMEN: abdomen soft, non-tender and normal bowel sounds Musculoskeletal:no cyanosis of digits and no clubbing  PSYCH: alert & oriented x 3 with fluent speech NEURO: no focal motor/sensory deficits SKIN:  no rashes or significant lesions  LABORATORY DATA:  I have reviewed the data as listed Lab Results  Component Value Date   WBC 13.0 (H)  11/15/2016   HGB 13.6 11/15/2016   HCT 39.8 11/15/2016   MCV 91.2 11/15/2016   PLT 196 11/15/2016    Recent Labs  10/10/16 1052  11/08/16 1221 11/11/16 1607  11/13/16 0335 11/14/16 0514 11/15/16 0453  NA 139  < > 137 134*  < > 136 138 137  K 4.1  < > 3.8 3.1*  < > 3.4* 3.0* 3.8  CL 102  < > 100* 91*  < > 94* 95* 94*  CO2 31  < > 30 32  < > 32 34* 37*  GLUCOSE 92  < > 99 102*  < > 151* 147* 139*  BUN 11  < > 15 16  < > 30* 25* 18  CREATININE 0.55  < > 0.56 0.51  < > 0.55 0.57 0.50  CALCIUM 9.0  < > 8.8* 8.6*  < > 8.8* 8.5* 8.6*  GFRNONAA >60  < > >60 >60  < > >60 >60 >60  GFRAA >60  < > >60 >60  < > >60 >60 >60  PROT 7.2  --  7.1 6.3*  --   --   --   --   ALBUMIN 4.1  --  4.2 2.9*  --   --   --   --   AST 13*  --  16 20  --   --   --   --   ALT 11*  --  14 14  --   --   --   --   ALKPHOS 79  --  72 68  --   --   --   --   BILITOT 0.4  --  0.5 1.4*  --   --   --   --   < > = values in this interval not displayed.  RADIOGRAPHIC STUDIES: I have personally reviewed the radiological images as listed and agreed with the findings in the report. US Abdomen Complete  Result Date: 11/12/2016 CLINICAL DATA:  Abdominal pain . EXAM: ABDOMEN ULTRASOUND COMPLETE COMPARISON:  CT 09/24/2016. FINDINGS:  Gallbladder: Gallbladder is distended with sludge and gallstones. Largest gallstone measures 2.5 cm. Gallbladder wall thickness 2.2 mm. Negative Murphy sign Common bile duct: Diameter: 5.7 mm Liver: No focal lesion identified. Within normal limits in parenchymal echogenicity. IVC: No abnormality visualized. Pancreas: Visualized portion unremarkable. Spleen: Size and appearance within normal limits. Right Kidney: Length: 12.9 cm. Echogenicity within normal limits. No mass or hydronephrosis visualized. Left Kidney: Length: 13.6 cm. Echogenicity within normal limits. No mass or hydronephrosis visualized. Abdominal aorta: No aneurysm visualized. Other findings: None. IMPRESSION: 1. Gall bladder is distended with sludge and gallstones. No evidence cholecystitis. 2.  Exam is otherwise unremarkable. Electronically Signed   By: Marcello Moores  Register   On: 11/12/2016 08:36    ASSESSMENT & PLAN:   Erythrocytosis Hemoglobin 17 hematocrit 52- approximately two months ago. Remains asymptomatic.  # Erythrocytosis- Labs checked on November 08, 2016 indicated normal blood counts including erythropoietin, CBC with Diff, JAK 2, LDH and CMP. Elevated blood counts possibly due to chronic O2 use and COPD. No further follow-up needed. Can be monitored by PCP.  Will relay results and evaluation to referring provider Dr. Netty Starring.  # Endometrial cancer- FIGO stage I- no adjuvant therapy. Patient will continue follow-up with GYN oncology.  # Pneumonia- Recently discharged from hospital (November 15, 2016) on Augmentin. Feeling better. On continuous 2.5 L home oxygen.  All questions were answered. The patient knows to call the clinic with any problems,  questions or concerns.   Faythe Casa, NP      Cammie Sickle, MD 12/11/2016 4:46 PM

## 2016-12-11 NOTE — Assessment & Plan Note (Signed)
Hemoglobin 17 hematocrit 52- approximately two months ago. Remains asymptomatic.  # Erythrocytosis- Labs checked on November 08, 2016 indicated normal blood counts including erythropoietin, CBC with Diff, JAK 2, LDH and CMP. Elevated blood counts possibly due to chronic O2 use and COPD. No further follow-up needed. Can be monitored by PCP.  Will relay results and evaluation to referring provider Dr. Netty Starring.  # Endometrial cancer- FIGO stage I- no adjuvant therapy. Patient will continue follow-up with GYN oncology.  # Pneumonia- Recently discharged from hospital (November 15, 2016) on Augmentin. Feeling better. On continuous 2.5 L home oxygen.  All questions were answered. The patient knows to call the clinic with any problems, questions or concerns.   Faythe Casa, NP

## 2016-12-13 ENCOUNTER — Encounter: Payer: Self-pay | Admitting: Pulmonary Disease

## 2016-12-13 ENCOUNTER — Ambulatory Visit (INDEPENDENT_AMBULATORY_CARE_PROVIDER_SITE_OTHER): Payer: Medicare HMO | Admitting: Pulmonary Disease

## 2016-12-13 VITALS — BP 142/80 | HR 96 | Wt 214.0 lb

## 2016-12-13 DIAGNOSIS — J9611 Chronic respiratory failure with hypoxia: Secondary | ICD-10-CM

## 2016-12-13 DIAGNOSIS — J449 Chronic obstructive pulmonary disease, unspecified: Secondary | ICD-10-CM

## 2016-12-13 DIAGNOSIS — F17200 Nicotine dependence, unspecified, uncomplicated: Secondary | ICD-10-CM

## 2016-12-13 NOTE — Patient Instructions (Addendum)
We discussed smoking cessation  If/when you are ready to quit, we can consider Chantix  Continue Anoro inhaler and oxygen therapy   Follow up with me in 3 months

## 2016-12-17 ENCOUNTER — Telehealth: Payer: Self-pay | Admitting: *Deleted

## 2016-12-17 NOTE — Telephone Encounter (Signed)
Patient was to have gallbladder surgery in December by Dr.Byrnett but canceled due to be in the hospital. She stated that she was to have it sometime in january 2018 but hasn't heard anything from [place]. She is ready to have it scheduled.

## 2016-12-18 ENCOUNTER — Encounter: Payer: Self-pay | Admitting: *Deleted

## 2016-12-18 ENCOUNTER — Other Ambulatory Visit: Payer: Self-pay | Admitting: *Deleted

## 2016-12-18 DIAGNOSIS — J189 Pneumonia, unspecified organism: Secondary | ICD-10-CM

## 2016-12-18 NOTE — Progress Notes (Signed)
Patient's surgery has been rescheduled from 12-27-16 to 01-23-17. This is due to patient being diagnosed with pneumonia 11-2016. We need to wait 2 months after this diagnosis per Dr. Bary Castilla.   This patient has been scheduled for a pre-op visit on 01-14-17 at 1 pm.   Patient to have a CXR PA and lateral on Friday, 12-20-16 at [facility].

## 2016-12-20 ENCOUNTER — Ambulatory Visit
Admission: RE | Admit: 2016-12-20 | Discharge: 2016-12-20 | Disposition: A | Discharge status: Home / Self Care | Payer: Medicare HMO | Source: Ambulatory Visit | Attending: General Surgery | Admitting: General Surgery

## 2016-12-20 DIAGNOSIS — J189 Pneumonia, unspecified organism: Secondary | ICD-10-CM | POA: Insufficient documentation

## 2016-12-20 DIAGNOSIS — J440 Chronic obstructive pulmonary disease with acute lower respiratory infection: Secondary | ICD-10-CM | POA: Diagnosis not present

## 2016-12-20 DIAGNOSIS — J90 Pleural effusion, not elsewhere classified: Secondary | ICD-10-CM | POA: Diagnosis not present

## 2016-12-21 NOTE — Progress Notes (Addendum)
PULMONARY POST HOSPITAL FOLLOW UP  PROBLEMS: Smoker Chronic hypoxemic respiratory failure COPD  PT PROFILE: 66 y.o. F smoker hospitalized 11/08-11/10/17 for elective hysterectomy. Noted to be hypoxemic post operatively. Seen in consultation by Dr Stevenson Clinch. Discharged home on O2@ 2 LPM [place]  DATA: CXR 10/10/16: Hyperinflation and mild diffuse interstitial prominence  INTERVAL HISTORY: Hospitalized in Dec for AF, CHF, possible PNA. Planned cholecystectomy was postponed.   SUBJ: She was hospitalized as noted above. She has resumed smoking 1/2 PPD. She never got the Nicotrol inhaler and finds that patches are minimally helpful. She is using Anoro inhaler which she believes is beneficial. Denies CP, fever, purulent sputum, hemoptysis, LE edema and calf tenderness  OBJ: Vitals:   12/13/16 1145  BP: (!) 142/80  Pulse: 96  SpO2: 92%  Weight: 214 lb (97.1 kg)  2 LPM [place]  NAD HEENT WNL NO JVD noted BS diminished, no wheezes Reg, no M NABS, soft No LE edema  DATA: No new CXR  IMPRESSION: Smoker  Chronic respiratory failure with hypoxia (HCC)  Chronic obstructive pulmonary disease, unspecified COPD type (HCC)  We again discussed the imperative of smoking cessation including strategies.   PLAN: 1) Smoking cessation was again discussed in detail including the risks associated with continued smoking, the benefits of smoking cessation and strategies that might be helpful.  2) Cont Anoro inhaler - one inhalation daily.  3) Continue oxygen therapy @ 2 lpm [place] as close to 24 hrs per day 4) We should be notified at the time of cholecystectomy if she is hospitalized @ [facility] 5) Follow up in 3 months   Merton Border, MD PCCM service Mobile [phone] Pager [phone] 12/21/2016   ADD 12/22/16: a pre-op CXR was ordered by surgeon. This reveals a mod-large loculated pleural effusion on L. Will get CT chest and she will need follow up after that to review the CT and decide on  further eval/mgmt  Merton Border, MD PCCM service Mobile [phone] Pager [phone] 12/22/2016

## 2016-12-23 ENCOUNTER — Inpatient Hospital Stay: Admission: RE | Admit: 2016-12-23 | Payer: Medicare HMO | Source: Ambulatory Visit

## 2016-12-23 ENCOUNTER — Telehealth: Payer: Self-pay

## 2016-12-23 ENCOUNTER — Telehealth: Payer: Self-pay | Admitting: *Deleted

## 2016-12-23 DIAGNOSIS — J90 Pleural effusion, not elsewhere classified: Secondary | ICD-10-CM

## 2016-12-23 NOTE — Telephone Encounter (Signed)
appt 12/27/16@[facility]  and has been precerted pt is aware Jessica Dalton

## 2016-12-23 NOTE — Telephone Encounter (Signed)
-----   Message from Wilhelmina Mcardle, MD sent at 12/22/2016  8:15 AM EST ----- Please schedule CT chest with contrast to evaluate left pleural effusion and she needs follow up with whoever can get her in ASAP to review CT and decide on further eval and mgmt. I will call patient and let her know that we are going to order that study  Thanks,  Waunita Schooner

## 2016-12-23 NOTE — Telephone Encounter (Signed)
Please schedule pt for CT chest w/contrast at [facility] and then we can schedule f/u appt with 1st available for results ASAP.

## 2016-12-23 NOTE — Telephone Encounter (Signed)
-----   Message from Robert Bellow, MD sent at 12/21/2016  5:14 PM EST ----- Notify patient she needs to f/u w/ pulmonary Alva Garnet) as her CXR shows fluid around her lung.   ----- Message ----- From: Interface, Rad Results In Sent: 12/20/2016  10:01 AM To: Robert Bellow, MD

## 2016-12-23 NOTE — Telephone Encounter (Signed)
Notified patient as instructed. She was contacted by her Pulmonologist and will be set up for a CT scan and follow up.

## 2016-12-27 ENCOUNTER — Ambulatory Visit: Payer: Medicare HMO

## 2016-12-31 DIAGNOSIS — J438 Other emphysema: Secondary | ICD-10-CM | POA: Insufficient documentation

## 2017-01-03 ENCOUNTER — Ambulatory Visit: Payer: Medicare HMO | Admitting: Pulmonary Disease

## 2017-01-07 DIAGNOSIS — M8589 Other specified disorders of bone density and structure, multiple sites: Secondary | ICD-10-CM | POA: Insufficient documentation

## 2017-01-08 ENCOUNTER — Ambulatory Visit
Admission: RE | Admit: 2017-01-08 | Discharge: 2017-01-08 | Disposition: A | Discharge status: Home / Self Care | Payer: Medicare HMO | Source: Ambulatory Visit | Attending: Pulmonary Disease | Admitting: Pulmonary Disease

## 2017-01-08 DIAGNOSIS — I700 Atherosclerosis of aorta: Secondary | ICD-10-CM | POA: Diagnosis not present

## 2017-01-08 DIAGNOSIS — R590 Localized enlarged lymph nodes: Secondary | ICD-10-CM | POA: Diagnosis not present

## 2017-01-08 DIAGNOSIS — K8020 Calculus of gallbladder without cholecystitis without obstruction: Secondary | ICD-10-CM | POA: Diagnosis not present

## 2017-01-08 DIAGNOSIS — J90 Pleural effusion, not elsewhere classified: Secondary | ICD-10-CM | POA: Insufficient documentation

## 2017-01-08 MED ORDER — IOPAMIDOL (ISOVUE-300) INJECTION 61%
75.0000 mL | Freq: Once | INTRAVENOUS | Status: AC | PRN
  Administered 2017-01-08: 75 mL via INTRAVENOUS

## 2017-01-09 DIAGNOSIS — E559 Vitamin D deficiency, unspecified: Secondary | ICD-10-CM | POA: Insufficient documentation

## 2017-01-14 ENCOUNTER — Ambulatory Visit (INDEPENDENT_AMBULATORY_CARE_PROVIDER_SITE_OTHER): Payer: Medicare HMO | Admitting: General Surgery

## 2017-01-14 ENCOUNTER — Encounter: Payer: Self-pay | Admitting: General Surgery

## 2017-01-14 ENCOUNTER — Other Ambulatory Visit: Payer: Self-pay | Admitting: General Surgery

## 2017-01-14 VITALS — BP 138/78 | HR 88 | Resp 16 | Ht 61.0 in | Wt 215.0 lb

## 2017-01-14 DIAGNOSIS — K8020 Calculus of gallbladder without cholecystitis without obstruction: Secondary | ICD-10-CM

## 2017-01-14 DIAGNOSIS — J90 Pleural effusion, not elsewhere classified: Secondary | ICD-10-CM | POA: Diagnosis not present

## 2017-01-14 NOTE — Progress Notes (Addendum)
Patient ID: Jessica Dalton, female   DOB: 1951/01/21, 66 y.o.   MRN: [id number]  Chief Complaint  Patient presents with  . Pre-op Exam    HPI Jessica Dalton is a 66 y.o. female here today for her pre op lap chole scheduled for 01/23/2017.Husband, Clair Gulling present at visit. HPI  Past Medical History:  Diagnosis Date  . Anemia   . Asthma   . Cancer ([place])    uterine ca  . Endometrial cancer ([place])   . Gallstones 10/16/2016  . Obesity   . Tobacco use     Past Surgical History:  Procedure Laterality Date  . CATARACT EXTRACTION, BILATERAL  2015  . PELVIC LYMPH NODE DISSECTION N/A 10/16/2016   Procedure: PELVIC LYMPH NODE DISSECTION;  Surgeon: Gillis Ends, MD;  Location: [facility] ORS;  Service: Gynecology;  Laterality: N/A;  . ROBOTIC ASSISTED TOTAL HYSTERECTOMY WITH BILATERAL SALPINGO OOPHERECTOMY Bilateral 10/16/2016   Procedure: ROBOTIC ASSISTED TOTAL HYSTERECTOMY WITH BILATERAL SALPINGO OOPHORECTOMY;  Surgeon: Gillis Ends, MD;  Location: [facility] ORS;  Service: Gynecology;  Laterality: Bilateral;  . SENTINEL NODE BIOPSY N/A 10/16/2016   Procedure: SENTINEL NODE INJECTION;  Surgeon: Gillis Ends, MD;  Location: [facility] ORS;  Service: Gynecology;  Laterality: N/A;  . TUBAL LIGATION      Family History  Problem Relation Age of Onset  . Cancer Mother     renal cell cancer early 25's  . Cancer Father     colon cancer late 58's   . Cancer Sister     melanoma  age 41; cervical vs endometrial cancer age?    Social History Social History  Substance Use Topics  . Smoking status: Current Every Day Smoker    Packs/day: 0.50    Years: 50.00  . Smokeless tobacco: Never Used     Comment: 75 pk yr - 1.5 ppd; 46 years; started age 76  . Alcohol use No    No Known Allergies  Current Outpatient Prescriptions  Medication Sig Dispense Refill  . acetaminophen (TYLENOL) 500 MG tablet Take 1,500 mg by mouth daily as needed for mild pain.    [name] albuterol (PROVENTIL  HFA;VENTOLIN HFA) 108 (90 Base) MCG/ACT inhaler Inhale 2 puffs into the lungs every 6 (six) hours as needed for wheezing or shortness of breath. 1 Inhaler 1  . apixaban (ELIQUIS) 5 MG TABS tablet Take 5 mg by mouth every 12 (twelve) hours.     [name] aspirin EC 81 MG EC tablet Take 1 tablet (81 mg total) by mouth daily.    . calcium carbonate (TUMS - DOSED IN MG ELEMENTAL CALCIUM) 500 MG chewable tablet Chew 1 tablet (200 mg of elemental calcium total) by mouth 2 (two) times daily. (Patient taking differently: Chew 1 tablet by mouth daily. )    . diltiazem (CARDIZEM CD) 120 MG 24 hr capsule Take 1 capsule (120 mg total) by mouth daily. 30 capsule 0  . docusate sodium (COLACE) 100 MG capsule Take 1 capsule (100 mg total) by mouth daily as needed for mild constipation. (Patient taking differently: Take 100 mg by mouth at bedtime. ) 60 capsule 3  . furosemide (LASIX) 20 MG tablet Take 1 tablet (20 mg total) by mouth 2 (two) times daily. (Patient taking differently: Take 20 mg by mouth daily as needed (for fluid retention). ) 60 tablet 0  . ibuprofen (ADVIL,MOTRIN) 200 MG tablet Take 400 mg by mouth every 6 (six) hours as needed for headache or moderate pain.    . nicotine (  NICODERM CQ - DOSED IN MG/24 HOURS) 14 mg/24hr patch Place 1 patch (14 mg total) onto the skin daily. 30 patch 10  . OXYGEN Inhale into the lungs.    . polyethylene glycol (MIRALAX / GLYCOLAX) packet Take 17 g by mouth at bedtime.     . potassium chloride (K-DUR) 10 MEQ tablet Take 1 tablet (10 mEq total) by mouth daily. (Patient taking differently: Take 10 mEq by mouth at bedtime. ) 30 tablet 0  . traMADol (ULTRAM) 50 MG tablet Take 1 tablet (50 mg total) by mouth every 6 (six) hours as needed for moderate pain. 30 tablet 0  . umeclidinium-vilanterol (ANORO ELLIPTA) 62.5-25 MCG/INH AEPB Inhale 1 puff into the lungs daily. 60 each 10   No current facility-administered medications for this visit.     Review of Systems Review of Systems   Constitutional: Negative.   Respiratory: Negative.   Cardiovascular: Negative.     Blood pressure 138/78, pulse 88, resp. rate 16, height 5\' 1"  (1.549 m), weight 215 lb (97.5 kg), SpO2 96 %.  Physical Exam Physical Exam  Constitutional: She is oriented to person, place, and time. She appears well-developed and well-nourished.  Eyes: Conjunctivae are normal. No scleral icterus.  Neck: Neck supple.  Cardiovascular: Normal rate, regular rhythm and normal heart sounds.   Pulmonary/Chest: Effort normal. She has decreased breath sounds in the left lower field.  No wheezes, rales or rhonchi.  Abdominal: Soft. Bowel sounds are normal. There is no tenderness.    Neurological: She is alert and oriented to person, place, and time.  Skin: Skin is warm and dry.    Data Reviewed 12/13/2016 pulmonary note from Merton Border, M.D. reviewed. CT scan planned.  CT scan of the chest dated 01/08/2017 reviewed. Moderate left pleural effusion.  Assessment    Symptomatic cholelithiasis.  Left pleural effusion.  Ongoing daily oxygen supplementation at 2 L nasal cannula.    Plan     I attempted to contact pulmonary today, but the physician is working in [place]. I contacted one of his partners will relay the message about the CT scan completed on January 31. The patient is scheduled to pre-admit at the hospital for her surgery dated 01/23/2017 tomorrow. We will obtain a follow-up chest x-ray in lateral decubitus view to determine if this is a free-flowing effusion that might be amenable to tap if needed.  If necessary, will arrange for repeat pulmonary consultation on January 17, 2017 when her physician is back in [place].  The patient was placed on anticoagulation during her November hospitalization for transient atrial fibrillation. The patient will stop her medication to full days prior to planned surgical intervention. On her clinical exam today she is in sinus rhythm, and this was noted  on her examination in 12/31/2016 with her PCP. She does have a tiny risk of clot formation if she has intermittent A. fib, but cessation is necessary prior to cholecystectomy. I do not think her risk factors are high enough to require bridging therapy.  The patient is still smoking, although she reports she is doing as good as she can.  The patient's at increased risk for surgical intervention based on her age, previous pulmonary compromise ongoing smoking.  The importance of clarifying her pulmonary status prior to next week surgery was emphasized, and she is aware that the procedure may be canceled by anesthesia fair uncomfortable.  Laparoscopic Cholecystectomy with Intraoperative Cholangiogram. The procedure, including it's potential risks and complications (including but not limited to infection,  bleeding, injury to intra-abdominal organs or bile ducts, bile leak, poor cosmetic result, sepsis and death) were discussed with the patient in detail. Non-operative options, including their inherent risks (acute calculous cholecystitis with possible choledocholithiasis or gallstone pancreatitis, with the risk of ascending cholangitis, sepsis, and death) were discussed as well. The patient expressed and understanding of what we discussed and wishes to proceed with laparoscopic cholecystectomy. The patient further understands that if it is technically not possible, or it is unsafe to proceed laparoscopically, that I will convert to an open cholecystectomy.    This information has been scribed by Gaspar Cola CMA.    Robert Bellow 01/14/2017, 4:52 PM  Chest x-rayof 01/15/2017: IMPRESSION: Small amount of free-flowing fluid on the left with the majority probably loculated.

## 2017-01-14 NOTE — Patient Instructions (Signed)
Monday night last dose Eliquis

## 2017-01-15 ENCOUNTER — Encounter
Admission: RE | Admit: 2017-01-15 | Discharge: 2017-01-15 | Disposition: A | Discharge status: Home / Self Care | Payer: Medicare HMO | Source: Ambulatory Visit | Attending: General Surgery | Admitting: General Surgery

## 2017-01-15 ENCOUNTER — Ambulatory Visit
Admission: RE | Admit: 2017-01-15 | Discharge: 2017-01-15 | Disposition: A | Discharge status: Home / Self Care | Payer: Medicare HMO | Source: Ambulatory Visit | Attending: General Surgery | Admitting: General Surgery

## 2017-01-15 ENCOUNTER — Encounter: Payer: Self-pay | Admitting: *Deleted

## 2017-01-15 DIAGNOSIS — J90 Pleural effusion, not elsewhere classified: Secondary | ICD-10-CM | POA: Diagnosis not present

## 2017-01-15 DIAGNOSIS — J984 Other disorders of lung: Secondary | ICD-10-CM

## 2017-01-15 DIAGNOSIS — D751 Secondary polycythemia: Secondary | ICD-10-CM | POA: Insufficient documentation

## 2017-01-15 DIAGNOSIS — I4891 Unspecified atrial fibrillation: Secondary | ICD-10-CM | POA: Insufficient documentation

## 2017-01-15 DIAGNOSIS — Z01812 Encounter for preprocedural laboratory examination: Secondary | ICD-10-CM | POA: Diagnosis present

## 2017-01-15 DIAGNOSIS — F17210 Nicotine dependence, cigarettes, uncomplicated: Secondary | ICD-10-CM | POA: Insufficient documentation

## 2017-01-15 DIAGNOSIS — Z01818 Encounter for other preprocedural examination: Secondary | ICD-10-CM | POA: Diagnosis not present

## 2017-01-15 DIAGNOSIS — C541 Malignant neoplasm of endometrium: Secondary | ICD-10-CM | POA: Diagnosis not present

## 2017-01-15 HISTORY — DX: Unspecified atrial fibrillation: I48.91

## 2017-01-15 HISTORY — DX: Dependence on supplemental oxygen: Z99.81

## 2017-01-15 HISTORY — DX: Chronic obstructive pulmonary disease, unspecified: J44.9

## 2017-01-15 HISTORY — DX: Pneumonia, unspecified organism: J18.9

## 2017-01-15 LAB — COMPREHENSIVE METABOLIC PANEL
ALT: 8 U/L — ABNORMAL LOW (ref 14–54)
ANION GAP: 10 (ref 5–15)
AST: 12 U/L — ABNORMAL LOW (ref 15–41)
Albumin: 3.9 g/dL (ref 3.5–5.0)
Alkaline Phosphatase: 97 U/L (ref 38–126)
BUN: 9 mg/dL (ref 6–20)
CALCIUM: 9.3 mg/dL (ref 8.9–10.3)
CHLORIDE: 101 mmol/L (ref 101–111)
CO2: 30 mmol/L (ref 22–32)
Creatinine, Ser: 0.54 mg/dL (ref 0.44–1.00)
GFR calc non Af Amer: >60 mL/min (ref >60)
Glucose, Bld: 82 mg/dL (ref 65–99)
Potassium: 3.5 mmol/L (ref 3.5–5.1)
SODIUM: 141 mmol/L (ref 135–145)
Total Bilirubin: 0.6 mg/dL (ref 0.3–1.2)
Total Protein: 8.2 g/dL — ABNORMAL HIGH (ref 6.5–8.1)

## 2017-01-15 LAB — CBC WITH DIFFERENTIAL/PLATELET
Basophils Absolute: 0.1 10*3/uL (ref 0–0.1)
Basophils Relative: 1 %
EOS ABS: 0.3 10*3/uL (ref 0–0.7)
EOS PCT: 3 %
HCT: 40.9 % (ref 35.0–47.0)
Hemoglobin: 14 g/dL (ref 12.0–16.0)
LYMPHS ABS: 2.2 10*3/uL (ref 1.0–3.6)
Lymphocytes Relative: 22 %
MCH: 30.1 pg (ref 26.0–34.0)
MCHC: 34.2 g/dL (ref 32.0–36.0)
MCV: 87.8 fL (ref 80.0–100.0)
MONO ABS: 0.9 10*3/uL (ref 0.2–0.9)
MONOS PCT: 9 %
Neutro Abs: 6.6 10*3/uL — ABNORMAL HIGH (ref 1.4–6.5)
Neutrophils Relative %: 65 %
PLATELETS: 337 10*3/uL (ref 150–440)
RBC: 4.66 MIL/uL (ref 3.80–5.20)
RDW: 15 % — AB (ref 11.5–14.5)
WBC: 10.1 10*3/uL (ref 3.6–11.0)

## 2017-01-15 NOTE — Pre-Procedure Instructions (Signed)
PULMONARY CLEARANCE REQUEST , AS INSTRUCTED BY DR Collyer FAXED TO DR Rosita Fire AND DR Bary Castilla

## 2017-01-15 NOTE — Progress Notes (Signed)
Per Judeen Hammans, anesthesia is requesting pulmonary clearance prior to surgery which is scheduled for Thursday, 01-23-17 at [facility].   Dr. Alva Garnet will be seeing the patient this Friday, 01-17-17 at 2:30 pm.  Patient aware of date and time of appointment. She verbalizes understanding.

## 2017-01-15 NOTE — Patient Instructions (Signed)
Your procedure is scheduled on: Thursday 01/23/17 Report to [place]. 2ND FLOOR MEDICAL MALL ENTRANCE. To find out your arrival time please call [phone] between 1PM - 3PM on Wednesday 01/22/17.  Remember: Instructions that are not followed completely may result in serious medical risk, up to and including death, or upon the discretion of your surgeon and anesthesiologist your surgery may need to be rescheduled.    __X__ 1. Do not eat food or drink liquids after midnight. No gum chewing or hard candies.     __X__ 2. No Alcohol for 24 hours before or after surgery.   ____ 3. Bring all medications with you on the day of surgery if instructed.    __X__ 4. Notify your doctor if there is any change in your medical condition     (cold, fever, infections).             __X___5. No smoking within 24 hours of your surgery.     Do not wear jewelry, make-up, hairpins, clips or nail polish.  Do not wear lotions, powders, or perfumes.   Do not shave 48 hours prior to surgery. Men may shave face and neck.  Do not bring valuables to the hospital.    [facility] is not responsible for any belongings or valuables.               Contacts, dentures or bridgework may not be worn into surgery.  Leave your suitcase in the car. After surgery it may be brought to your room.  For patients admitted to the hospital, discharge time is determined by your                treatment team.   Patients discharged the day of surgery will not be allowed to drive home.   Please read over the following fact sheets that you were given:   Pain Booklet and MRSA Information   __X__ Take these medicines the morning of surgery with A SIP OF WATER:    1. DILTIAZEM  2.   3.   4.  5.  6.  ____ Fleet Enema (as directed)   __X__ Use CHG Soap as directed  __X__ Use inhalers on the day of surgery  ____ Stop metformin 2 days prior to surgery    ____ Take 1/2 of usual insulin dose the night before surgery and none on  the morning of surgery.   __X__ Stop Coumadin/Plavix/aspirin on STOP ELOQUIS AS RECOMENDED  __X__ Stop Anti-inflammatories such as Advil, Aleve, Ibuprofen, Motrin, Naproxen, Naprosyn, Goodies,powder, or aspirin products.  OK to take Tylenol.   __X__ Stop supplements until after surgery.    ____ Bring C-Pap to the hospital.

## 2017-01-16 ENCOUNTER — Encounter: Payer: Self-pay | Admitting: *Deleted

## 2017-01-16 NOTE — Progress Notes (Signed)
Patient was contacted today and reminded that she will need to stop Eliquis on Monday night, 01-20-17 assuming we get pulmonary clearance from Dr. Alva Garnet on Friday.  Surgery currently scheduled for 01-23-17 at [facility]  This patient verbalizes understanding.

## 2017-01-17 ENCOUNTER — Ambulatory Visit (INDEPENDENT_AMBULATORY_CARE_PROVIDER_SITE_OTHER): Payer: Medicare HMO | Admitting: Pulmonary Disease

## 2017-01-17 ENCOUNTER — Encounter: Payer: Self-pay | Admitting: Pulmonary Disease

## 2017-01-17 VITALS — BP 134/88 | HR 75 | Wt 216.0 lb

## 2017-01-17 DIAGNOSIS — R0902 Hypoxemia: Secondary | ICD-10-CM | POA: Diagnosis not present

## 2017-01-17 DIAGNOSIS — F17200 Nicotine dependence, unspecified, uncomplicated: Secondary | ICD-10-CM

## 2017-01-17 DIAGNOSIS — Z01818 Encounter for other preprocedural examination: Secondary | ICD-10-CM

## 2017-01-17 DIAGNOSIS — J449 Chronic obstructive pulmonary disease, unspecified: Secondary | ICD-10-CM | POA: Diagnosis not present

## 2017-01-17 DIAGNOSIS — J90 Pleural effusion, not elsewhere classified: Secondary | ICD-10-CM

## 2017-01-17 NOTE — Patient Instructions (Signed)
Again, I strongly urge that you quit smoking  With regard to the fluid collection around your left lung, it seems to be getting better. We will not undertake any specific evaluation of this right now. Rather, we will recheck another chest Xray in 4 -6 weeks  Your risk of breathing complications around the time of surgery is elevated due to your lung disease and smoking. However, there is little more that we can do (other than you quitting smoking) that will reduce this risk. Therefore, from my perspective, you may undergo the surgery as scheduled.  If you are hospitalized after the surgery, please make sure my service (pulmonary medicine) is contacted  Follow up in 4-6 weeks with repeat chest Xray

## 2017-01-20 ENCOUNTER — Encounter: Payer: Self-pay | Admitting: *Deleted

## 2017-01-20 NOTE — Progress Notes (Signed)
Per note from Dr. Alva Garnet on 01-17-17, okay to proceed with surgery as scheduled for 01-23-17 at [facility].  Pulmonary clearance note has been forwarded to the pre-admission department.

## 2017-01-23 ENCOUNTER — Ambulatory Visit
Admission: RE | Admit: 2017-01-23 | Discharge: 2017-01-23 | Disposition: A | Discharge status: Home / Self Care | Payer: Medicare HMO | Source: Ambulatory Visit | Attending: General Surgery | Admitting: General Surgery

## 2017-01-23 ENCOUNTER — Encounter: Payer: Self-pay | Admitting: *Deleted

## 2017-01-23 ENCOUNTER — Encounter
Admission: RE | Disposition: A | Discharge status: Home / Self Care | Payer: Self-pay | Source: Ambulatory Visit | Attending: General Surgery

## 2017-01-23 ENCOUNTER — Ambulatory Visit: Payer: Medicare HMO

## 2017-01-23 ENCOUNTER — Ambulatory Visit: Payer: Medicare HMO | Admitting: Certified Registered Nurse Anesthetist

## 2017-01-23 DIAGNOSIS — J449 Chronic obstructive pulmonary disease, unspecified: Secondary | ICD-10-CM | POA: Insufficient documentation

## 2017-01-23 DIAGNOSIS — E669 Obesity, unspecified: Secondary | ICD-10-CM | POA: Insufficient documentation

## 2017-01-23 DIAGNOSIS — Z8589 Personal history of malignant neoplasm of other organs and systems: Secondary | ICD-10-CM | POA: Insufficient documentation

## 2017-01-23 DIAGNOSIS — J90 Pleural effusion, not elsewhere classified: Secondary | ICD-10-CM | POA: Insufficient documentation

## 2017-01-23 DIAGNOSIS — K8044 Calculus of bile duct with chronic cholecystitis without obstruction: Secondary | ICD-10-CM | POA: Diagnosis not present

## 2017-01-23 DIAGNOSIS — Z6841 Body Mass Index (BMI) 40.0 and over, adult: Secondary | ICD-10-CM | POA: Diagnosis not present

## 2017-01-23 DIAGNOSIS — K8020 Calculus of gallbladder without cholecystitis without obstruction: Secondary | ICD-10-CM

## 2017-01-23 DIAGNOSIS — Z9981 Dependence on supplemental oxygen: Secondary | ICD-10-CM | POA: Insufficient documentation

## 2017-01-23 DIAGNOSIS — Z7901 Long term (current) use of anticoagulants: Secondary | ICD-10-CM | POA: Diagnosis not present

## 2017-01-23 DIAGNOSIS — I4891 Unspecified atrial fibrillation: Secondary | ICD-10-CM | POA: Diagnosis not present

## 2017-01-23 DIAGNOSIS — F17210 Nicotine dependence, cigarettes, uncomplicated: Secondary | ICD-10-CM | POA: Diagnosis not present

## 2017-01-23 DIAGNOSIS — K8010 Calculus of gallbladder with chronic cholecystitis without obstruction: Secondary | ICD-10-CM | POA: Insufficient documentation

## 2017-01-23 HISTORY — PX: CHOLECYSTECTOMY: SHX55

## 2017-01-23 SURGERY — LAPAROSCOPIC CHOLECYSTECTOMY WITH INTRAOPERATIVE CHOLANGIOGRAM
Anesthesia: General | Wound class: Clean Contaminated

## 2017-01-23 MED ORDER — ONDANSETRON HCL 4 MG/2ML IJ SOLN
INTRAMUSCULAR | Status: DC | PRN
  Administered 2017-01-23: 4 mg via INTRAVENOUS

## 2017-01-23 MED ORDER — HYDROCODONE-ACETAMINOPHEN 5-325 MG PO TABS
ORAL_TABLET | ORAL | Status: AC
  Administered 2017-01-23: 2
  Filled 2017-01-23: qty 2

## 2017-01-23 MED ORDER — FENTANYL CITRATE (PF) 100 MCG/2ML IJ SOLN
INTRAMUSCULAR | Status: AC
  Filled 2017-01-23: qty 2

## 2017-01-23 MED ORDER — PROPOFOL 10 MG/ML IV BOLUS
INTRAVENOUS | Status: AC
  Filled 2017-01-23: qty 20

## 2017-01-23 MED ORDER — KETOROLAC TROMETHAMINE 30 MG/ML IJ SOLN
INTRAMUSCULAR | Status: DC | PRN
  Administered 2017-01-23: 15 mg via INTRAVENOUS

## 2017-01-23 MED ORDER — MIDAZOLAM HCL 2 MG/2ML IJ SOLN
INTRAMUSCULAR | Status: AC
  Filled 2017-01-23: qty 2

## 2017-01-23 MED ORDER — PROPOFOL 10 MG/ML IV BOLUS
INTRAVENOUS | Status: DC | PRN
  Administered 2017-01-23: 150 mg via INTRAVENOUS

## 2017-01-23 MED ORDER — LACTATED RINGERS IV SOLN
INTRAVENOUS | Status: DC
  Administered 2017-01-23: 06:00:00 via INTRAVENOUS

## 2017-01-23 MED ORDER — LABETALOL HCL 5 MG/ML IV SOLN
INTRAVENOUS | Status: DC | PRN
  Administered 2017-01-23 (×2): 10 mg via INTRAVENOUS

## 2017-01-23 MED ORDER — FAMOTIDINE 20 MG PO TABS
20.0000 mg | ORAL_TABLET | Freq: Once | ORAL | Status: AC
  Administered 2017-01-23: 20 mg via ORAL

## 2017-01-23 MED ORDER — FENTANYL CITRATE (PF) 100 MCG/2ML IJ SOLN
25.0000 ug | INTRAMUSCULAR | Status: DC | PRN
  Administered 2017-01-23 (×4): 25 ug via INTRAVENOUS

## 2017-01-23 MED ORDER — SODIUM CHLORIDE 0.9 % IJ SOLN
INTRAMUSCULAR | Status: AC
  Filled 2017-01-23: qty 50

## 2017-01-23 MED ORDER — CEFAZOLIN SODIUM-DEXTROSE 2-4 GM/100ML-% IV SOLN
2.0000 g | INTRAVENOUS | Status: AC
  Administered 2017-01-23: 2 g via INTRAVENOUS

## 2017-01-23 MED ORDER — SUGAMMADEX SODIUM 200 MG/2ML IV SOLN
INTRAVENOUS | Status: AC
Start: 2017-01-23 — End: 2017-01-23
  Filled 2017-01-23: qty 2

## 2017-01-23 MED ORDER — ROCURONIUM BROMIDE 100 MG/10ML IV SOLN
INTRAVENOUS | Status: DC | PRN
  Administered 2017-01-23: 10 mg via INTRAVENOUS
  Administered 2017-01-23: 30 mg via INTRAVENOUS

## 2017-01-23 MED ORDER — ACETAMINOPHEN 10 MG/ML IV SOLN
INTRAVENOUS | Status: AC
  Filled 2017-01-23: qty 100

## 2017-01-23 MED ORDER — EPHEDRINE SULFATE 50 MG/ML IJ SOLN
INTRAMUSCULAR | Status: AC
  Filled 2017-01-23: qty 1

## 2017-01-23 MED ORDER — IPRATROPIUM-ALBUTEROL 0.5-2.5 (3) MG/3ML IN SOLN
3.0000 mL | Freq: Once | RESPIRATORY_TRACT | Status: AC
  Administered 2017-01-23: 3 mL via RESPIRATORY_TRACT

## 2017-01-23 MED ORDER — LIDOCAINE HCL (PF) 2 % IJ SOLN
INTRAMUSCULAR | Status: AC
  Filled 2017-01-23: qty 2

## 2017-01-23 MED ORDER — ONDANSETRON HCL 4 MG/2ML IJ SOLN
INTRAMUSCULAR | Status: AC
  Filled 2017-01-23: qty 2

## 2017-01-23 MED ORDER — CEFAZOLIN SODIUM-DEXTROSE 2-4 GM/100ML-% IV SOLN
INTRAVENOUS | Status: AC
  Administered 2017-01-23: 2 g via INTRAVENOUS
  Filled 2017-01-23: qty 100

## 2017-01-23 MED ORDER — ACETAMINOPHEN 10 MG/ML IV SOLN
INTRAVENOUS | Status: DC | PRN
  Administered 2017-01-23: 1000 mg via INTRAVENOUS

## 2017-01-23 MED ORDER — ONDANSETRON HCL 4 MG/2ML IJ SOLN: 4.0000 mg | Freq: Once | INTRAMUSCULAR | Status: DC | PRN

## 2017-01-23 MED ORDER — METHYLPREDNISOLONE SODIUM SUCC 125 MG IJ SOLR
INTRAMUSCULAR | Status: DC | PRN
  Administered 2017-01-23: 125 mg via INTRAVENOUS

## 2017-01-23 MED ORDER — LIDOCAINE HCL (CARDIAC) 20 MG/ML IV SOLN
INTRAVENOUS | Status: DC | PRN
  Administered 2017-01-23: 50 mg via INTRAVENOUS

## 2017-01-23 MED ORDER — KETOROLAC TROMETHAMINE 30 MG/ML IJ SOLN
INTRAMUSCULAR | Status: AC
  Filled 2017-01-23: qty 1

## 2017-01-23 MED ORDER — SUCCINYLCHOLINE CHLORIDE 20 MG/ML IJ SOLN
INTRAMUSCULAR | Status: AC
  Filled 2017-01-23: qty 1

## 2017-01-23 MED ORDER — LABETALOL HCL 5 MG/ML IV SOLN
INTRAVENOUS | Status: AC
  Filled 2017-01-23: qty 4

## 2017-01-23 MED ORDER — FAMOTIDINE 20 MG PO TABS
ORAL_TABLET | ORAL | Status: AC
  Administered 2017-01-23: 20 mg via ORAL
  Filled 2017-01-23: qty 1

## 2017-01-23 MED ORDER — DEXAMETHASONE SODIUM PHOSPHATE 10 MG/ML IJ SOLN
INTRAMUSCULAR | Status: AC
  Filled 2017-01-23: qty 1

## 2017-01-23 MED ORDER — FENTANYL CITRATE (PF) 100 MCG/2ML IJ SOLN
INTRAMUSCULAR | Status: AC
  Administered 2017-01-23: 25 ug via INTRAVENOUS
  Filled 2017-01-23: qty 2

## 2017-01-23 MED ORDER — ROCURONIUM BROMIDE 50 MG/5ML IV SOLN
INTRAVENOUS | Status: AC
  Filled 2017-01-23: qty 1

## 2017-01-23 MED ORDER — SUGAMMADEX SODIUM 200 MG/2ML IV SOLN
INTRAVENOUS | Status: DC | PRN
  Administered 2017-01-23: 200 mg via INTRAVENOUS

## 2017-01-23 MED ORDER — HYDROCODONE-ACETAMINOPHEN 5-325 MG PO TABS: 1.0000 | ORAL_TABLET | ORAL | 0 refills | Status: DC | PRN

## 2017-01-23 MED ORDER — IOTHALAMATE MEGLUMINE 60 % INJ SOLN
INTRAMUSCULAR | Status: DC | PRN
  Administered 2017-01-23: 25 mL

## 2017-01-23 MED ORDER — MIDAZOLAM HCL 2 MG/2ML IJ SOLN
INTRAMUSCULAR | Status: DC | PRN
  Administered 2017-01-23: 2 mg via INTRAVENOUS

## 2017-01-23 MED ORDER — DEXAMETHASONE SODIUM PHOSPHATE 10 MG/ML IJ SOLN
INTRAMUSCULAR | Status: DC | PRN
  Administered 2017-01-23: 10 mg via INTRAVENOUS

## 2017-01-23 MED ORDER — FENTANYL CITRATE (PF) 100 MCG/2ML IJ SOLN
INTRAMUSCULAR | Status: DC | PRN
  Administered 2017-01-23 (×2): 50 ug via INTRAVENOUS

## 2017-01-23 MED ORDER — SUCCINYLCHOLINE CHLORIDE 20 MG/ML IJ SOLN
INTRAMUSCULAR | Status: DC | PRN
  Administered 2017-01-23: 120 mg via INTRAVENOUS

## 2017-01-23 MED ORDER — IPRATROPIUM-ALBUTEROL 0.5-2.5 (3) MG/3ML IN SOLN
RESPIRATORY_TRACT | Status: AC
  Administered 2017-01-23: 3 mL via RESPIRATORY_TRACT
  Filled 2017-01-23: qty 3

## 2017-01-23 MED ORDER — SODIUM CHLORIDE 0.9 % IJ SOLN
INTRAMUSCULAR | Status: AC
  Filled 2017-01-23: qty 10

## 2017-01-23 MED ORDER — PHENYLEPHRINE HCL 10 MG/ML IJ SOLN
INTRAMUSCULAR | Status: AC
  Filled 2017-01-23: qty 1

## 2017-01-23 MED ORDER — IPRATROPIUM-ALBUTEROL 0.5-2.5 (3) MG/3ML IN SOLN: 3.0000 mL | RESPIRATORY_TRACT | Status: DC

## 2017-01-23 SURGICAL SUPPLY — 43 items
APPLIER CLIP ROT 10 11.4 M/L (STAPLE) ×3
BLADE SURG 11 STRL SS SAFETY (MISCELLANEOUS) ×3 IMPLANT
CANISTER SUCT 1200ML W/VALVE (MISCELLANEOUS) ×3 IMPLANT
CANNULA DILATOR  5MM W/SLV (CANNULA) ×2
CANNULA DILATOR 10 W/SLV (CANNULA) ×2 IMPLANT
CANNULA DILATOR 10MM W/SLV (CANNULA) ×1
CANNULA DILATOR 5 W/SLV (CANNULA) ×4 IMPLANT
CATH CHOLANG 76X19 KUMAR (CATHETERS) ×3 IMPLANT
CHLORAPREP W/TINT 26ML (MISCELLANEOUS) ×3 IMPLANT
CLIP APPLIE ROT 10 11.4 M/L (STAPLE) ×1 IMPLANT
CLOSURE WOUND 1/2 X4 (GAUZE/BANDAGES/DRESSINGS) ×1
CONRAY 60ML FOR OR (MISCELLANEOUS) ×3 IMPLANT
DISSECTOR KITTNER STICK (MISCELLANEOUS) ×1 IMPLANT
DISSECTORS/KITTNER STICK (MISCELLANEOUS) ×3
DRAPE SHEET LG 3/4 BI-LAMINATE (DRAPES) ×3 IMPLANT
DRESSING TELFA 4X3 1S ST N-ADH (GAUZE/BANDAGES/DRESSINGS) ×3 IMPLANT
DRSG TEGADERM 2-3/8X2-3/4 SM (GAUZE/BANDAGES/DRESSINGS) ×12 IMPLANT
ELECT REM PT RETURN 9FT ADLT (ELECTROSURGICAL) ×3
ELECTRODE REM PT RTRN 9FT ADLT (ELECTROSURGICAL) ×1 IMPLANT
ENDOPOUCH RETRIEVER 10 (MISCELLANEOUS) IMPLANT
GLOVE BIO SURGEON STRL SZ7.5 (GLOVE) ×9 IMPLANT
GLOVE INDICATOR 8.0 STRL GRN (GLOVE) ×9 IMPLANT
GOWN STRL REUS W/ TWL LRG LVL3 (GOWN DISPOSABLE) ×3 IMPLANT
GOWN STRL REUS W/TWL LRG LVL3 (GOWN DISPOSABLE) ×6
GRASPER SUT TROCAR 14GX15 (MISCELLANEOUS) ×3 IMPLANT
IRRIGATION STRYKERFLOW (MISCELLANEOUS) ×1 IMPLANT
IRRIGATOR STRYKERFLOW (MISCELLANEOUS) ×3
IV LACTATED RINGERS 1000ML (IV SOLUTION) ×3 IMPLANT
KIT RM TURNOVER STRD PROC AR (KITS) ×3 IMPLANT
LABEL OR SOLS (LABEL) ×3 IMPLANT
NDL INSUFF ACCESS 14 VERSASTEP (NEEDLE) ×3 IMPLANT
NS IRRIG 500ML POUR BTL (IV SOLUTION) ×3 IMPLANT
PACK LAP CHOLECYSTECTOMY (MISCELLANEOUS) ×3 IMPLANT
SCISSORS METZENBAUM CVD 33 (INSTRUMENTS) ×3 IMPLANT
SEAL FOR SCOPE WARMER C3101 (MISCELLANEOUS) ×3 IMPLANT
STRIP CLOSURE SKIN 1/2X4 (GAUZE/BANDAGES/DRESSINGS) ×2 IMPLANT
SUT MAXON ABS #0 GS21 30IN (SUTURE) ×3 IMPLANT
SUT VIC AB 0 CT2 27 (SUTURE) ×6 IMPLANT
SUT VIC AB 4-0 FS2 27 (SUTURE) ×3 IMPLANT
SWABSTK COMLB BENZOIN TINCTURE (MISCELLANEOUS) ×3 IMPLANT
TROCAR XCEL NON-BLD 11X100MML (ENDOMECHANICALS) ×3 IMPLANT
TUBING INSUFFLATOR HI FLOW (MISCELLANEOUS) ×3 IMPLANT
WATER STERILE IRR 1000ML POUR (IV SOLUTION) ×3 IMPLANT

## 2017-01-23 NOTE — Anesthesia Procedure Notes (Signed)
Procedure Name: Intubation Date/Time: 01/23/2017 7:28 AM Performed by: Johnna Acosta Pre-anesthesia Checklist: Patient identified, Emergency Drugs available, Suction available, Patient being monitored and Timeout performed Patient Re-evaluated:Patient Re-evaluated prior to inductionOxygen Delivery Method: Circle system utilized Preoxygenation: Pre-oxygenation with 100% oxygen Intubation Type: IV induction Ventilation: Mask ventilation without difficulty Laryngoscope Size: Miller and 2 Grade View: Grade I Tube type: Oral Tube size: 7.0 mm Number of attempts: 1 Airway Equipment and Method: Stylet Placement Confirmation: ETT inserted through vocal cords under direct vision,  positive ETCO2 and breath sounds checked- equal and bilateral Secured at: 21 cm Tube secured with: Tape Dental Injury: Teeth and Oropharynx as per pre-operative assessment

## 2017-01-23 NOTE — Anesthesia Preprocedure Evaluation (Signed)
Anesthesia Evaluation  Patient identified by MRN, date of birth, ID band Patient awake    Reviewed: Allergy & Precautions, NPO status , Patient's Chart, lab work & pertinent test results  Airway Mallampati: III  TM Distance: <3 FB     Dental  (+) Chipped   Pulmonary asthma , pneumonia, resolved, COPD, Current Smoker,     + decreased breath sounds      Cardiovascular Normal cardiovascular exam+ dysrhythmias Atrial Fibrillation      Neuro/Psych negative neurological ROS  negative psych ROS   GI/Hepatic negative GI ROS, Neg liver ROS,   Endo/Other  negative endocrine ROS  Renal/GU negative Renal ROS  negative genitourinary   Musculoskeletal negative musculoskeletal ROS (+)   Abdominal Normal abdominal exam  (+)   Peds negative pediatric ROS (+)  Hematology  (+) anemia ,   Anesthesia Other Findings   Reproductive/Obstetrics                             Anesthesia Physical Anesthesia Plan  ASA: III  Anesthesia Plan: General   Post-op Pain Management:    Induction: Intravenous  Airway Management Planned: Oral ETT  Additional Equipment:   Intra-op Plan:   Post-operative Plan: Extubation in OR  Informed Consent: I have reviewed the patients History and Physical, chart, labs and discussed the procedure including the risks, benefits and alternatives for the proposed anesthesia with the patient or authorized representative who has indicated his/her understanding and acceptance.   Dental advisory given  Plan Discussed with: CRNA and Surgeon  Anesthesia Plan Comments:         Anesthesia Quick Evaluation

## 2017-01-23 NOTE — H&P (Signed)
Cleared by pulmonary for surgery. For cholecystectomy.

## 2017-01-23 NOTE — Discharge Instructions (Signed)

## 2017-01-23 NOTE — Anesthesia Post-op Follow-up Note (Cosign Needed)
Anesthesia QCDR form completed.        

## 2017-01-23 NOTE — Transfer of Care (Signed)
Immediate Anesthesia Transfer of Care Note  Patient: Jessica Dalton  Procedure(s) Performed: Procedure(s): LAPAROSCOPIC CHOLECYSTECTOMY WITH INTRAOPERATIVE CHOLANGIOGRAM (N/A)  Patient Location: PACU  Anesthesia Type:General  Level of Consciousness: awake and alert   Airway & Oxygen Therapy: Patient Spontanous Breathing and Patient connected to face mask oxygen  Post-op Assessment: Report given to RN and Post -op Vital signs reviewed and stable  Post vital signs: Reviewed and stable  Last Vitals:  Vitals:   01/23/17 0604 01/23/17 0909  BP: 130/63 (!) 157/74  Pulse: 84 80  Resp: (!) 22 18  Temp: 36.2 C 36.4 C    Last Pain:  Vitals:   01/23/17 0909  TempSrc: Tympanic  PainSc:          Complications: No apparent anesthesia complications

## 2017-01-23 NOTE — Op Note (Signed)
Preoperative diagnosis: Chronic cholecystitis and cholelithiasis.  Postoperative diagnosis: Same.  Operative procedure: Laparoscopic cholecystectomy, attempted cholangiograms.  Operating surgeon: Ollen Bowl, M.D.  Anesthesia: Gen. endotracheal.  Clinical note: This 66 year old woman was noted with cholelithiasis on a CT scan obtained last fall for evaluation of uterine cancer. She is become symptomatic and is now considered a candidate for cholecystectomy. She has increased risk based on obesity, previous surgery, COPD, left pleural effusion and ongoing smoking. She was evaluated by pulmonology prior to the procedure.  The patient received Kefzol intravenously prior the procedure.  Operative note: With the patient under adequate general endotracheal anesthesia the abdomen was prepped with ChloraPrep and draped. An Trendelenburg position a varies needle was placed through a trans-of multiple incision. After assuring intra-abdominal location with the hanging drop test the abdomen was insufflated with CO2 a 10 mmHg pressure. A 10 mm step port was expanded and inspection showed no evidence of injury from initial port placement. The patient was placed in a reverse Trendelenburg position and rolled to the left. The gallbladder was noted to be distended but not ischemic. Minimal adhesions between the gallbladder and the omentum. An 11 mm XL port was placed in the epigastrium under direct vision and 2-5 mm step ports were placed in the right lateral abdominal wall under direct vision. The gallbladder was desufflated about 5 or 10 mL of thick, crank case colored bile which allowed a better grasp. The gallbladder was placed on cephalad traction and it was possible to identify the large stone in the neck of the gallbladder. Adhesions between this area and the duodenum were taken down gently with cautery and blunt dissection. The cystic duct and cystic artery were identified. Attempted cholangiograms  undertaken with a Kumar clamp but reflux only into the gallbladder was noted and extravasation. The area of the common bile duct was visualized and it was felt safe to proceed. The cystic duct and cystic artery were both doubly clipped and divided. The gallbladder was then removed from the liver bed making use of hook cautery dissection. It was delivered into an Endo Catch bag and then to the umbilical port site. It was necessary to extend the incision at the umbilicus to allow extraction of the near 3 cm stones. After the gallbladder was extracted pneumoperitoneum was reestablished. The right upper quadrant was irrigated with lactated Ringer solution. Good hemostasis was noted. The lateral ports were then removed. The abdomen was desufflated and final ports removed under direct vision. The fascia at the umbilicus was closed with a 0 Maxon figure-of-eight suture 2. Skin incisions were closed with 4-0 Vicryl subcutaneous sutures. Benzoin, Steri-Strips, Telfa and Tegaderm dressings were applied.  The patient received dexamethasone and Solu-Medrol during the procedure as well as Tylenol and Toradol.  The patient was taken to the recovery room in stable condition.

## 2017-01-24 LAB — SURGICAL PATHOLOGY

## 2017-01-24 NOTE — Progress Notes (Signed)
PULMONARY POST HOSPITAL FOLLOW UP  PROBLEMS: Smoker Chronic hypoxemic respiratory failure COPD  PT PROFILE: 66 y.o. F smoker hospitalized 11/08-11/10/17 for elective hysterectomy. Noted to be hypoxemic post operatively. Seen in consultation by Dr Stevenson Clinch. Discharged home on O2@ 2 LPM [place]  DATA: CXR 10/10/16: Hyperinflation and mild diffuse interstitial prominence CXR 12/20/16: pre-op cxr revealed partially loculated L pleural effusion CT chest 01/08/17: Partially loculated moderate left pleural effusion posterolateral leak. Airspace disease in the lingula and posterior left lower lobe.  CXR 01/15/17: reduction in L pleural effusion  INTERVAL HISTORY: Incidentally found to have L pleural effusion on pre-op CXR (prior to cholecystectomy). I ordered CT scan as above.   SUBJ: Here to review results of CT chest and repeat CXR and to decide on whether further W/U of L pleural effusion is indicated. Continues to smoke approx 5 cigs/d. No new complaints. Denies CP, fever, purulent sputum, hemoptysis, LE edema and calf tenderness  OBJ: Vitals:   01/17/17 1432  BP: 134/88  Pulse: 75  SpO2: 98%  Weight: 216 lb (98 kg)  2 LPM [place]  NAD HEENT WNL NO JVD noted Decreased BS approx 1/4 up on L, BS coarse elsewhere, no wheezes Reg, no M NABS, soft No LE edema  DATA: No new CXR  IMPRESSION: 1) COPD with chronic hypoxemic resp failure 2) Recalcitrant smoker 3) L pleural effusion of unclear etiology. Appears to be resolving spontaneously 4) Pre-op pulmonary eval for laparoscopic cholecystectomy   PLAN: 1) Smoking cessation was again discussed in detail including the risks associated with continued smoking, the benefits of smoking cessation and strategies that might be helpful.  2) Cont Anoro inhaler - one inhalation daily.  3) Continue oxygen therapy @ 2 lpm [place] as close to 24 hrs per day 4) We will continue to monitor pleural eff expectantly as it seems to be resolving spontaneously 5)  Her risk of pulmonary complications around the time of surgery is elevated due to her lung disease and smoking. However, there is little more that we can do (other than her quitting smoking) that will reduce this risk. Therefore, from my perspective, she may undergo the surgery as scheduled. 6) Follow up in 4-6 weeks with repeat CXR  Merton Border, MD PCCM service Mobile [phone] Pager [phone] 01/24/2017

## 2017-01-24 NOTE — Anesthesia Postprocedure Evaluation (Signed)
Anesthesia Post Note  Patient: Jessica Dalton  Procedure(s) Performed: Procedure(s) (LRB): LAPAROSCOPIC CHOLECYSTECTOMY WITH INTRAOPERATIVE CHOLANGIOGRAM (N/A)  Patient location during evaluation: PACU Anesthesia Type: General Level of consciousness: awake and alert and oriented Pain management: pain level controlled Vital Signs Assessment: post-procedure vital signs reviewed and stable Respiratory status: spontaneous breathing Cardiovascular status: blood pressure returned to baseline Anesthetic complications: no     Last Vitals:  Vitals:   01/23/17 1019 01/23/17 1035  BP:  (!) 147/49  Pulse: 74 72  Resp: 18 18  Temp:  36.5 C    Last Pain:  Vitals:   01/23/17 1035  TempSrc: Temporal  PainSc: 7                  Keigan Tafoya

## 2017-01-27 IMAGING — CR DG CHEST 2V
1 series · 2 of 2 positions shown · non-contrast
Comparison: 11/11/2016 and 10/10/2016.

CLINICAL DATA: Followup for pneumonia diagnosed 1 month ago.
Patient scheduled for gallbladder surgery.

EXAM:
CHEST  2 VIEW

[Series 1: w chest pa · 0.14mm/px · 2 of 2 slices shown]
[im 1/2]
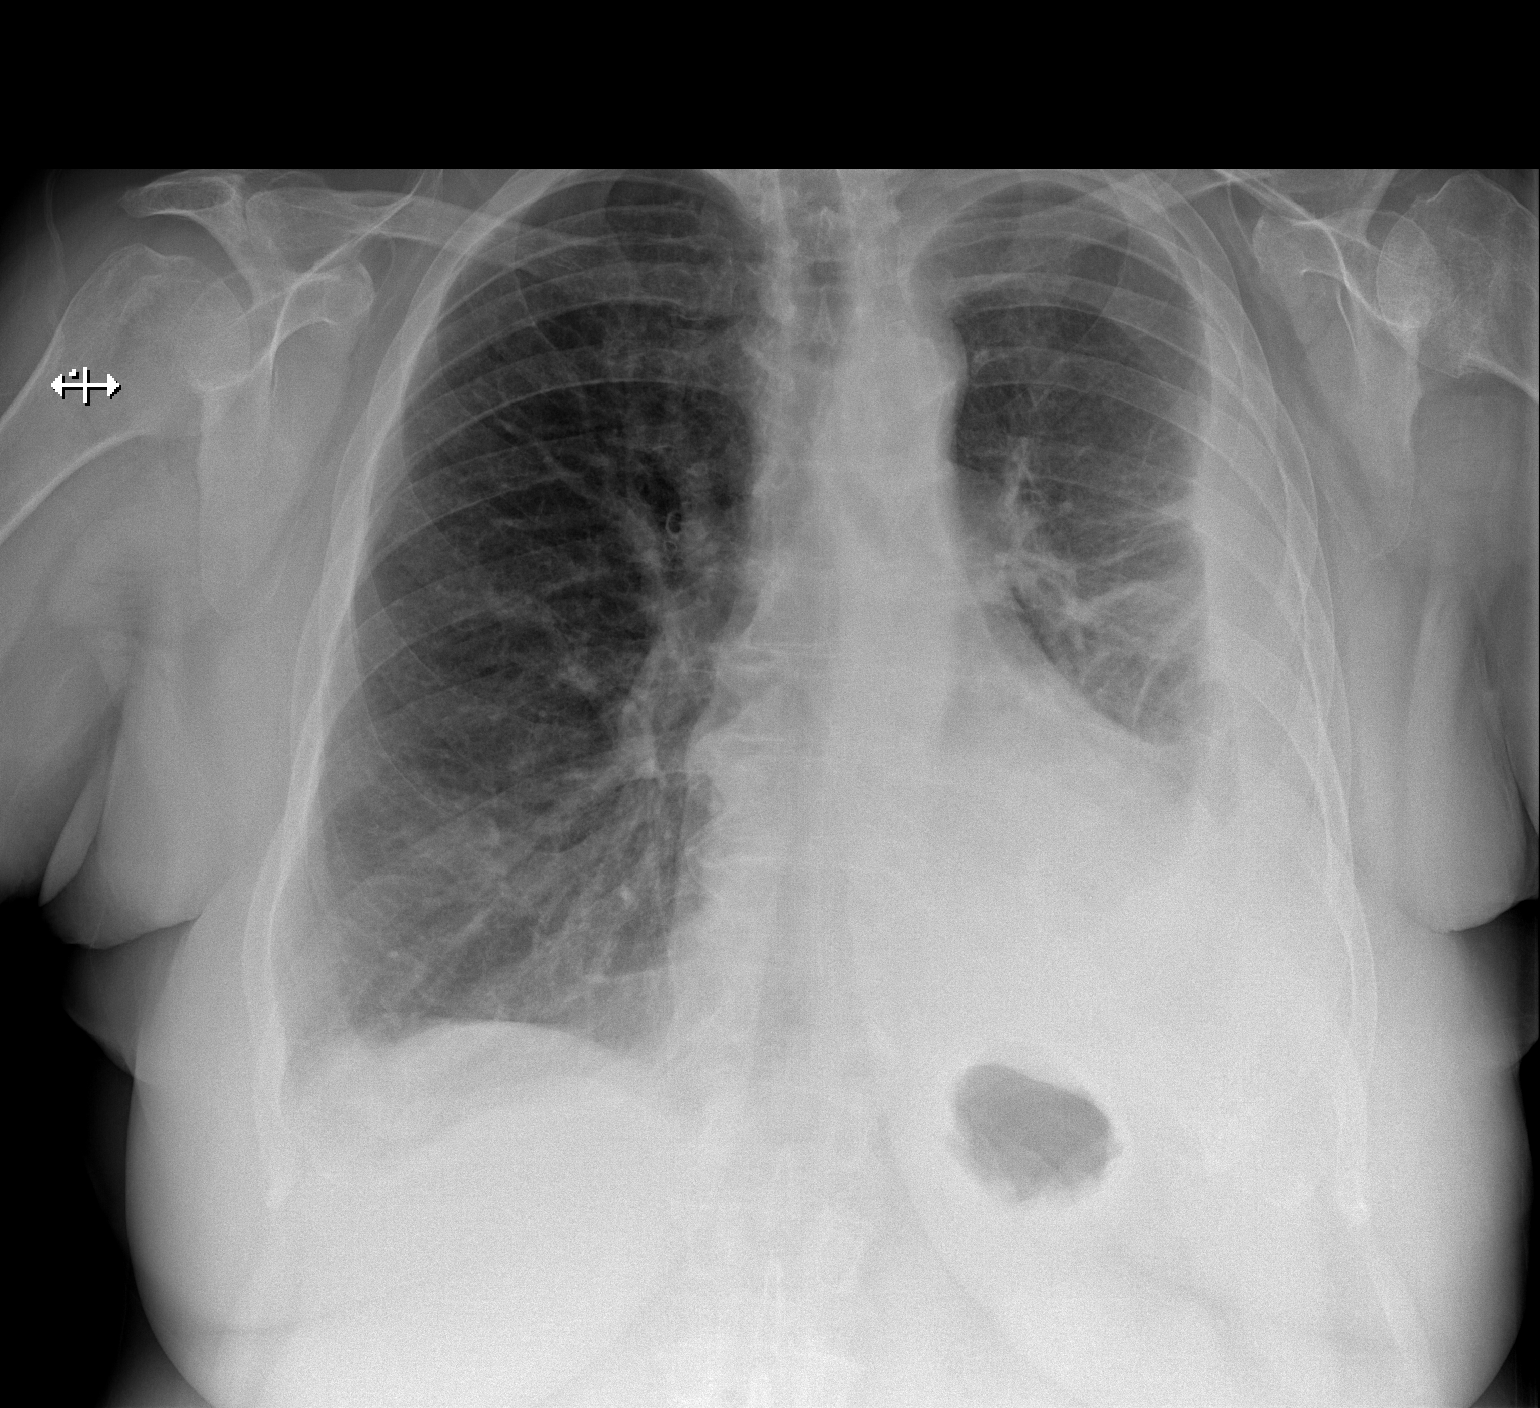
[im 2/2]
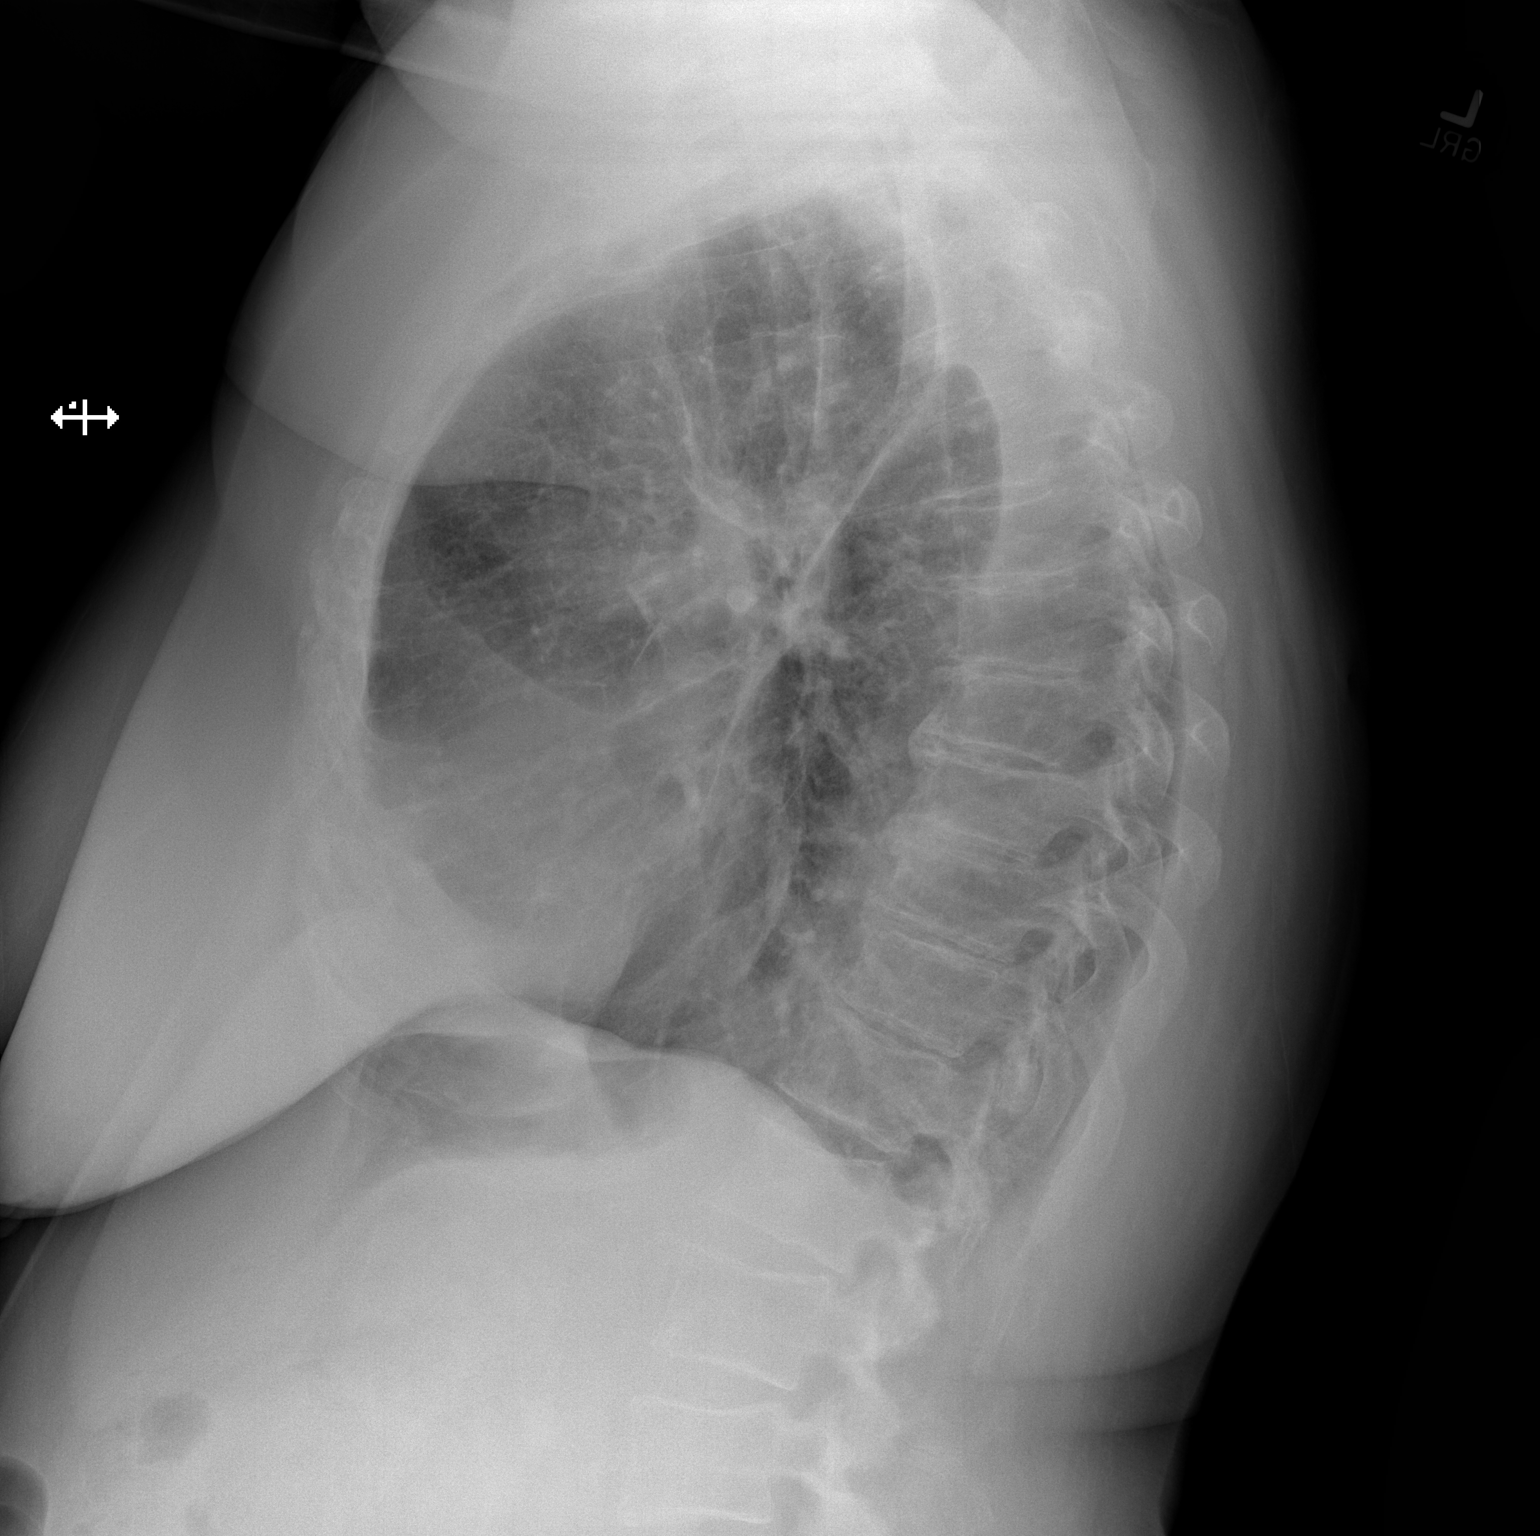

[2 of 2 positions shown; findings below may reference images not displayed]

FINDINGS: There is a new or enlarged pleural effusion that extends across the
left lateral hemithorax and obscures the left lung base and
hemidiaphragm. Linear type opacities are noted in the left mid and
lower lung, likely atelectasis with a component of pneumonia
possible. No pulmonary edema.

Right lung is hyperexpanded, but clear.  No right pleural effusion.

There is no pneumothorax.

Cardiac silhouette is top-normal in size. No mediastinal or hilar
masses. No convincing adenopathy.

Skeletal structures are demineralized but grossly intact.
IMPRESSION: 1. Partly loculated moderate size left pleural effusion has
developed since the prior studies. There is associated left mid to
lower lung zone atelectasis. A component of pneumonia is possible.
2. No pulmonary edema.  No right pleural effusion.
3. Underlying COPD.

## 2017-02-05 ENCOUNTER — Encounter: Payer: Self-pay | Admitting: General Surgery

## 2017-02-05 ENCOUNTER — Ambulatory Visit (INDEPENDENT_AMBULATORY_CARE_PROVIDER_SITE_OTHER): Payer: Medicare HMO | Admitting: General Surgery

## 2017-02-05 VITALS — BP 140/82 | HR 70 | Resp 18 | Ht 61.0 in | Wt 217.0 lb

## 2017-02-05 DIAGNOSIS — K8020 Calculus of gallbladder without cholecystitis without obstruction: Secondary | ICD-10-CM

## 2017-02-05 NOTE — Progress Notes (Signed)
Patient ID: Jessica [place], female   DOB: [birth year], 66 y.o.   MRN: [id number]  Chief Complaint  Patient presents with  . Routine Post Op    HPI Jessica Dalton is a 66 y.o. femalehere today for her post op cholecystectomy done on 01/23/2017. Patient states she is doing well. Marland KitchenHPI  Past Medical History:  Diagnosis Date  . A-fib ([place])   . Anemia   . Asthma   . 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 COPD (chronic obstructive pulmonary disease) ([place])   . Endometrial cancer ([place])   . Gallstones 10/16/2016  . Obesity   . Oxygen dependent   . Pneumonia   . Tobacco use     Past Surgical History:  Procedure Laterality Date  . ABDOMINAL HYSTERECTOMY    . CATARACT EXTRACTION, BILATERAL  2015  . CHOLECYSTECTOMY N/A 01/23/2017   Procedure: LAPAROSCOPIC CHOLECYSTECTOMY WITH INTRAOPERATIVE CHOLANGIOGRAM;  Surgeon: Robert Bellow, MD;  Location: [facility] ORS;  Service: General;  Laterality: N/A;  . PELVIC LYMPH NODE DISSECTION N/A 10/16/2016   Procedure: PELVIC LYMPH NODE DISSECTION;  Surgeon: Gillis Ends, MD;  Location: [facility] ORS;  Service: Gynecology;  Laterality: N/A;  . ROBOTIC ASSISTED TOTAL HYSTERECTOMY WITH BILATERAL SALPINGO OOPHERECTOMY Bilateral 10/16/2016   Procedure: ROBOTIC ASSISTED TOTAL HYSTERECTOMY WITH BILATERAL SALPINGO OOPHORECTOMY;  Surgeon: Gillis Ends, MD;  Location: [facility] ORS;  Service: Gynecology;  Laterality: Bilateral;  . SENTINEL NODE BIOPSY N/A 10/16/2016   Procedure: SENTINEL NODE INJECTION;  Surgeon: Gillis Ends, MD;  Location: [facility] ORS;  Service: Gynecology;  Laterality: N/A;  . TUBAL LIGATION      Family History  Problem Relation Age of Onset  . Cancer Mother     renal cell cancer early 30's  . Cancer Father     colon cancer late 69's   . Cancer Sister     melanoma  age 23; cervical vs endometrial cancer age?    Social History Social History  Substance Use Topics  . Smoking status: Current Every Day Smoker    Packs/day: 0.50   Years: 50.00  . Smokeless tobacco: Never Used     Comment: 75 pk yr - 1.5 ppd; 60 years; started age 49  . Alcohol use No    No Known Allergies  Current Outpatient Prescriptions  Medication Sig Dispense Refill  . acetaminophen (TYLENOL) 500 MG tablet Take 1,500 mg by mouth daily as needed for mild pain.    [name] albuterol (PROVENTIL HFA;VENTOLIN HFA) 108 (90 Base) MCG/ACT inhaler Inhale 2 puffs into the lungs every 6 (six) hours as needed for wheezing or shortness of breath. 1 Inhaler 1  . aspirin EC 81 MG EC tablet Take 1 tablet (81 mg total) by mouth daily.    . Calcium Carbonate-Vitamin D (OYSTER SHELL CALCIUM/D) 500-400 MG-UNIT TABS Take 1 tablet by mouth daily.     [name] diltiazem (CARDIZEM CD) 120 MG 24 hr capsule Take 1 capsule (120 mg total) by mouth daily. 30 capsule 0  . docusate sodium (COLACE) 100 MG capsule Take 1 capsule (100 mg total) by mouth daily as needed for mild constipation. (Patient taking differently: Take 100 mg by mouth at bedtime. ) 60 capsule 3  . furosemide (LASIX) 20 MG tablet Take 1 tablet (20 mg total) by mouth 2 (two) times daily. (Patient taking differently: Take 20 mg by mouth daily as needed (for fluid retention). ) 60 tablet 0  . ibuprofen (ADVIL,MOTRIN) 200 MG tablet Take 400 mg by mouth every  6 (six) hours as needed for headache or moderate pain.    . nicotine (NICODERM CQ - DOSED IN MG/24 HOURS) 14 mg/24hr patch Place 1 patch (14 mg total) onto the skin daily. (Patient taking differently: Place 14 mg onto the skin daily as needed. ) 30 patch 10  . OXYGEN Inhale 2 L into the lungs daily.     . polyethylene glycol (MIRALAX / GLYCOLAX) packet Take 17 g by mouth at bedtime.     . traMADol (ULTRAM) 50 MG tablet Take 1 tablet (50 mg total) by mouth every 6 (six) hours as needed for moderate pain. 30 tablet 0  . umeclidinium-vilanterol (ANORO ELLIPTA) 62.5-25 MCG/INH AEPB Inhale 1 puff into the lungs daily. 60 each 10  . Vitamin D, Ergocalciferol, (DRISDOL) 50000 units  CAPS capsule Take 50,000 Units by mouth every 7 (seven) days. Wednesday     No current facility-administered medications for this visit.     Review of Systems Review of Systems  Constitutional: Negative.   Respiratory: Negative.   Cardiovascular: Negative.     Blood pressure 140/82, pulse 70, resp. rate 18, height 5\' 1"  (1.549 m), weight 217 lb (98.4 kg), SpO2 98 %.  Physical Exam Physical Exam  Constitutional: She is oriented to person, place, and time. She appears well-developed and well-nourished.  Cardiovascular: Normal rate, regular rhythm and normal heart sounds.   Pulmonary/Chest: Effort normal and breath sounds normal.  Abdominal: Soft. Normal appearance and bowel sounds are normal. There is no tenderness.  [name] are clean and healing well.  Neurological: She is alert and oriented to person, place, and time.  Skin: Skin is warm and dry.    Data Reviewed DIAGNOSIS:  A. GALLBLADDER; CHOLECYSTECTOMY:  - CHRONIC CHOLECYSTITIS AND CHOLELITHIASIS.  - NEGATIVE FOR DYSPLASIA AND MALIGNANCY.   Assessment    Doing well status post cholecystectomy.    Plan    The patient will increase her activity as tolerated. Proper lifting technique reviewed.  Patient again encouraged to discontinue smoking.     Patient to return as needed.   This information has been scribed by Gaspar Cola CMA.   Robert Bellow 02/06/2017, 12:13 PM

## 2017-02-05 NOTE — Patient Instructions (Signed)
Return as needed

## 2017-02-06 ENCOUNTER — Ambulatory Visit: Payer: Medicare HMO | Admitting: Pulmonary Disease

## 2017-02-28 ENCOUNTER — Ambulatory Visit
Admission: RE | Admit: 2017-02-28 | Discharge: 2017-02-28 | Disposition: A | Discharge status: Home / Self Care | Payer: Medicare HMO | Source: Ambulatory Visit | Attending: Pulmonary Disease | Admitting: Pulmonary Disease

## 2017-02-28 ENCOUNTER — Ambulatory Visit (INDEPENDENT_AMBULATORY_CARE_PROVIDER_SITE_OTHER): Payer: Medicare HMO | Admitting: Pulmonary Disease

## 2017-02-28 ENCOUNTER — Encounter: Payer: Self-pay | Admitting: Pulmonary Disease

## 2017-02-28 VITALS — BP 138/78 | HR 80 | Wt 222.0 lb

## 2017-02-28 DIAGNOSIS — J9611 Chronic respiratory failure with hypoxia: Secondary | ICD-10-CM

## 2017-02-28 DIAGNOSIS — J90 Pleural effusion, not elsewhere classified: Secondary | ICD-10-CM

## 2017-02-28 DIAGNOSIS — F17200 Nicotine dependence, unspecified, uncomplicated: Secondary | ICD-10-CM

## 2017-02-28 DIAGNOSIS — J449 Chronic obstructive pulmonary disease, unspecified: Secondary | ICD-10-CM

## 2017-02-28 NOTE — Patient Instructions (Signed)
Smoking cessation as we discussed Continue Anoro inhaler Continue oxygen with sleep and as needed during day Chest x-ray today-we will call you with results Follow-up in 4-6 months or sooner as needed

## 2017-02-28 NOTE — Progress Notes (Signed)
PULMONARY OFFICE FOLLOW UP  PROBLEMS: Smoker Chronic hypoxemic respiratory failure COPD  PT PROFILE: 66 y.o. F smoker hospitalized 11/08-11/10/17 for elective hysterectomy. Noted to be hypoxemic post operatively. Seen in consultation by Dr Stevenson Clinch. Discharged home on O2@ 2 LPM [place]  DATA: CXR 10/10/16: Hyperinflation and mild diffuse interstitial prominence CXR 12/20/16: pre-op cxr revealed partially loculated L pleural effusion CT chest 01/08/17: Partially loculated moderate left pleural effusion posterolateral leak. Airspace disease in the lingula and posterior left lower lobe.  CXR 01/15/17: reduction in L pleural effusion  INTERVAL HISTORY: Underwent uncomplicated laparoscopic cholecystectomy on 01/23/17  SUBJ: She had no problems with the cholecystectomy. She is here for routine reevaluation. She continues to smoke approximately one half pack cigarettes per day. She remains on Anoro inhaler and albuterol as needed which she uses rarely. She is wearing oxygen only with sleep and occasionally during the daytime when she "feels like she needs it". She denies fever, chest pain, purulent sputum, hemoptysis, lower extremity edema, calf tenderness.  OBJ: Vitals:   02/28/17 1141  BP: 138/78  Pulse: 80  SpO2: 93%  Weight: 222 lb (100.7 kg)  2 LPM [place]  NAD HEENT WNL NO JVD noted Breath sounds are slightly decreased in the left base with faint left basilar crackles. No wheezes Reg, no M NABS, soft No LE edema  DATA: Chest x-ray 02/28/17 reveals probably complete resolution of left pleural effusion with chronic pleural thickening in the left base. This might also represent a small loculated left pleural effusion. Overall it is markedly improved from prior studies.  IMPRESSION: 1) Recalcitrant smoker 2) chronic hypoxemic respiratory failure  3) COPD, severity of obstruction is unknown. 4) left pleural effusion - appears resolved on chest x-ray with residual pleural thickening in the  left base   PLAN: 1) Smoking cessation was again discussed in detail including the risks associated with continued smoking, the benefits of smoking cessation and strategies that might be helpful.  2) Cont Anoro inhaler - one inhalation daily.  3) Continue oxygen therapy @ 2 lpm [place] with sleep and exertion 4) chest x-ray was ordered today with the results as noted above. We will notify her of these results 5) follow-up in 4-6 months or sooner as needed  Merton Border, MD PCCM service Mobile [phone] Pager [phone] 02/28/2017

## 2017-03-05 ENCOUNTER — Inpatient Hospital Stay: Payer: Medicare HMO | Attending: Obstetrics and Gynecology | Admitting: Obstetrics and Gynecology

## 2017-03-05 VITALS — BP 117/69 | HR 87 | Temp 97.0°F | Ht 61.0 in | Wt 220.1 lb

## 2017-03-05 DIAGNOSIS — J449 Chronic obstructive pulmonary disease, unspecified: Secondary | ICD-10-CM | POA: Diagnosis not present

## 2017-03-05 DIAGNOSIS — I4891 Unspecified atrial fibrillation: Secondary | ICD-10-CM | POA: Diagnosis not present

## 2017-03-05 DIAGNOSIS — D751 Secondary polycythemia: Secondary | ICD-10-CM | POA: Diagnosis not present

## 2017-03-05 DIAGNOSIS — Z8542 Personal history of malignant neoplasm of other parts of uterus: Secondary | ICD-10-CM | POA: Insufficient documentation

## 2017-03-05 DIAGNOSIS — Z7982 Long term (current) use of aspirin: Secondary | ICD-10-CM | POA: Insufficient documentation

## 2017-03-05 DIAGNOSIS — J90 Pleural effusion, not elsewhere classified: Secondary | ICD-10-CM | POA: Diagnosis not present

## 2017-03-05 DIAGNOSIS — Z90722 Acquired absence of ovaries, bilateral: Secondary | ICD-10-CM | POA: Insufficient documentation

## 2017-03-05 DIAGNOSIS — Z9981 Dependence on supplemental oxygen: Secondary | ICD-10-CM

## 2017-03-05 DIAGNOSIS — K8020 Calculus of gallbladder without cholecystitis without obstruction: Secondary | ICD-10-CM | POA: Insufficient documentation

## 2017-03-05 DIAGNOSIS — Z79899 Other long term (current) drug therapy: Secondary | ICD-10-CM | POA: Diagnosis not present

## 2017-03-05 DIAGNOSIS — E669 Obesity, unspecified: Secondary | ICD-10-CM | POA: Insufficient documentation

## 2017-03-05 DIAGNOSIS — K5900 Constipation, unspecified: Secondary | ICD-10-CM | POA: Insufficient documentation

## 2017-03-05 DIAGNOSIS — F17210 Nicotine dependence, cigarettes, uncomplicated: Secondary | ICD-10-CM | POA: Diagnosis not present

## 2017-03-05 DIAGNOSIS — C541 Malignant neoplasm of endometrium: Secondary | ICD-10-CM

## 2017-03-05 DIAGNOSIS — I700 Atherosclerosis of aorta: Secondary | ICD-10-CM | POA: Insufficient documentation

## 2017-03-05 DIAGNOSIS — Z90710 Acquired absence of both cervix and uterus: Secondary | ICD-10-CM | POA: Insufficient documentation

## 2017-03-05 NOTE — Progress Notes (Signed)
  Oncology Nurse Navigator Documentation Chaperoned pelvic exam. Follow up in 6 months Navigator Location: [facility] (03/05/17 1100)   )Navigator Encounter Type: Follow-up Appt (03/05/17 1100)                                                    Time Spent with Patient: 15 (03/05/17 1100)

## 2017-03-05 NOTE — Progress Notes (Signed)
Patient here for follow up. She has complaints of bloating and discomfort in her abdomin.

## 2017-03-05 NOTE — Progress Notes (Signed)
Gynecologic Oncology Interval Visit   Referring Provider: Boykin Nearing, MD [address] [facility] [place], [place] [address] [phone]  Chief Concern: surveillance visit for endometrial cancer  Subjective:  Jessica Dalton is a 66 y.o. female who is seen in consultation from Dr. Ouida Sills for grade 1 endometrial cancer.  In 2/18 Dr Bary Castilla performed LS cholecystectomy without incident.  She has no new complaints today.  Oncology history She was referred to Dr. Ouida Sills by Dr. Netty Starring for PMB.  grade 1 endometrial cancer. EMBx and Pap performed on 09/09/2016. Uterus sounded to 10 cm. 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 Pap NILM.   Dr. Ouida Sills also ordered a CT scan to evaluate upper abdominal pain symptoms.    On 10/16/2016 she underwent RA-TLH BSO and SLN injection, mapping and biopsy. Her intraoperative and postoperative course was significant for pulmonary issues. She had desaturation due to chronic pulmonary disease from smoking.   A preoperative CT scan A/P revealed the following: IMPRESSION: 1. Single borderline enlarged portal hepatic lymph node. No additional findings suspicious for or suggestive of metastatic disease. Further imaging with PET-CT may be helpful to assess for hyper metabolism within this lymph node. 2. Left adrenal nodule, indeterminate. This could be better assessed with adrenal protocol MRI or PET-CT. 3. Thickening of the endometrium which in a postmenopausal female is considered abnormal and consistent with the clinical history of endometrial sarcoma. Stones identified within the gallbladder are noted measuring up to 3 cm.   I reviewed the findings preoperatively and spoke with Dr. Bary Castilla to see if he could be available for cholecystectomy as she was symptomatic with the gallstones. Unfortunately he was not available but recommended postoperative referral.  Preop CXR:   IMPRESSION: Hyperinflation and mild diffuse interstitial prominence consistent with the history of reactive airway disease. There is no pneumonia, CHF, nor other acute cardiopulmonary abnormality.  Aortic atherosclerosis.  Pathology noted as below:  Cytology: Negative  DIAGNOSIS:  A. SENTINEL LYMPH NODE, LEFT; EXCISION:  - NO TUMOR SEEN IN ONE LYMPH NODE (0/1).   B. SENTINEL LYMPH NODE, LEFT; EXCISION:  - NO TUMOR SEEN IN TWO LYMPH NODES (0/2).   C. UTERUS WITH CERVIX, BILATERAL FALLOPIAN TUBES AND OVARIES;  HYSTERECTOMY WITH BILATERAL SALPINGO-OOPHORECTOMY:  - ENDOMETRIOID ADENOCARCINOMA FIGO I.  - SURFACE DEGENERATIVE PAPILLARY FEATURES.  - MYOMETRIAL INVASION IS NOT IDENTIFIED.  - CHRONIC CERVICITIS.  - STROMAL HYPERPLASIA OF BILATERAL OVARIES.  - NO PATHOLOGIC CHANGE, BILATERAL FALLOPIAN TUBES.  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    ENDOMETRIUM: Hysterectomy, With or Without Other Organs or Tissues  Specimens InvolvedA: Sentinel lymph node, left  B: Sentinel lymph node, right proximal obturator  C: Uterus with cervix, bilateral tubes and ovaries   Endometrium, Hysterectomy, With or Without Other Organs or Tissues  Cancer Case Summary  Specimen: Uterine corpus  SPECIMEN  Procedure  Simple hysterectomy  Additional Procedures:  Bilateral salpingo-oophorectomy  Lymph Node Sampling:   Performed  Pelvic lymph nodes  Specimen Integrity: Intact hysterectomy specimen  TUMOR  Histologic Type:  Endometrioid adenocarcinoma, not otherwise  characterized  Histologic Grade:  FIGO grade 1  EXTENT  Tumor Size:  Greatest dimension (cm)  4.2cm  Myometrial Invasion:   Not identified  Involvement of Cervix:  Not involved  Other Organs Submitted: Right ovary  Not involved  Left ovary  Not involved  Right fallopian tube  Not involved  Left fallopian tube  Not involved  ACCESSORY  FINDINGS  Lymph-Vascular Invasion:  Not identified  STAGE (pTNM [FIGO])  Primary Tumor (pT):  pT1a [IA]: Tumor limited to endometrium or invades less than one-half  of the myometrium  Regional Lymph Nodes (pN)  pN0: No regional lymph node metastasis  Pelvic Lymph Nodes: Number of Lymph Nodes Examined:  Specify  3  Number of Lymph Nodes Involved:  Specify  0  Para-aortic Lymph Nodes: No para-aortic nodes submitted or found  Distant Metastasis (pM): Not applicable   Since surgery she was seen by Dr. Bary Castilla on 11/06/2016 with the plan to proceed with Laparoscopic Cholecystectomy with Intraoperative Cholangiogram. She had a consult with Dr. Rogue Bussing for erythocytosis - work up still ongoing. She was admitted on 11/11/2016 throught 11/15/2016 with sepsis, pneumonia, COPD exacerbation,  dyspnea, and new onset atrial fibrillation. She is being treated by Dr. Netty Starring and Dr. Clayborn Bigness.  Her cholecystectomy was canceled due to her other comorbidities. She has multiple complaints as noted in review of systems.    Problem List: Patient Active Problem List   Diagnosis Date Noted  . Pleural effusion 01/14/2017  . Constipation 12/04/2016  . Atrial fibrillation with RVR ([place]) 11/11/2016  . Erythrocytosis 11/08/2016  . Cigarette smoker   . Gallstones 10/16/2016  . Endometrial cancer determined by uterine biopsy ([place]) 10/16/2016  . Endometrial cancer ([place]) 09/18/2016    Past Medical History: Past Medical History:  Diagnosis Date  . A-fib ([place])   . Anemia   . Asthma   . Cancer ([place])    uterine ca  . COPD (chronic obstructive pulmonary disease) ([place])   . Endometrial cancer ([place])   . Gallstones 10/16/2016  . Obesity   . Oxygen dependent   . Pneumonia   . Tobacco use     Past Surgical History: Past Surgical History:  Procedure Laterality Date  . ABDOMINAL HYSTERECTOMY    . CATARACT EXTRACTION, BILATERAL  2015  . CHOLECYSTECTOMY N/A 01/23/2017   Procedure: LAPAROSCOPIC CHOLECYSTECTOMY WITH INTRAOPERATIVE  CHOLANGIOGRAM;  Surgeon: Robert Bellow, MD;  Location: [facility] ORS;  Service: General;  Laterality: N/A;  . PELVIC LYMPH NODE DISSECTION N/A 10/16/2016   Procedure: PELVIC LYMPH NODE DISSECTION;  Surgeon: Gillis Ends, MD;  Location: [facility] ORS;  Service: Gynecology;  Laterality: N/A;  . ROBOTIC ASSISTED TOTAL HYSTERECTOMY WITH BILATERAL SALPINGO OOPHERECTOMY Bilateral 10/16/2016   Procedure: ROBOTIC ASSISTED TOTAL HYSTERECTOMY WITH BILATERAL SALPINGO OOPHORECTOMY;  Surgeon: Gillis Ends, MD;  Location: [facility] ORS;  Service: Gynecology;  Laterality: Bilateral;  . SENTINEL NODE BIOPSY N/A 10/16/2016   Procedure: SENTINEL NODE INJECTION;  Surgeon: Gillis Ends, MD;  Location: [facility] ORS;  Service: Gynecology;  Laterality: N/A;  . TUBAL LIGATION      Past Gynecologic History:  Menarche: unknown Menstrual details: postmenopausal Last Menstrual Period: 15 years ago History of OCP/HRT use: None History of Abnormal pap: no, benign cellular changes Last pap: as per HPI   OB History:  OB History  Gravida Para Term Preterm AB Living  3 3 3     3   SAB TAB Ectopic Multiple Live Births               # Outcome Date GA Lbr Len/2nd Weight Sex Delivery Anes PTL Lv  3 Term           2 Term           1 Term             Obstetric Comments  1st Menstrual Cycle:  ?  1st Pregnancy:  19      Family History: Family History  Problem Relation Age of Onset  . Cancer Mother     renal cell cancer early 36's  . Cancer Father     colon cancer late 106's   . Cancer Sister     melanoma  age 78; cervical vs endometrial cancer age?    Social History: Social History   Social History  . Marital status: Married    Spouse name: N/A  . Number of children: N/A  . Years of education: N/A   Occupational History  . Not on file.   Social History Main Topics  . Smoking status: Current Every Day Smoker    Packs/day: 0.50    Years: 50.00  . Smokeless tobacco: Never Used      Comment: 75 pk yr - 1.5 ppd; 20 years; started age 38  . Alcohol use No  . Drug use: No  . Sexual activity: Not on file   Other Topics Concern  . Not on file   Social History Narrative  . No narrative on file    Allergies: No Known Allergies  Current Medications: Current Outpatient Prescriptions  Medication Sig Dispense Refill  . acetaminophen (TYLENOL) 500 MG tablet Take 1,500 mg by mouth daily as needed for mild pain.    [name] albuterol (PROVENTIL HFA;VENTOLIN HFA) 108 (90 Base) MCG/ACT inhaler Inhale 2 puffs into the lungs every 6 (six) hours as needed for wheezing or shortness of breath. 1 Inhaler 1  . aspirin EC 81 MG EC tablet Take 1 tablet (81 mg total) by mouth daily.    . Calcium Carbonate-Vitamin D (OYSTER SHELL CALCIUM/D) 500-400 MG-UNIT TABS Take 1 tablet by mouth daily.     [name] diltiazem (CARDIZEM CD) 120 MG 24 hr capsule Take 1 capsule (120 mg total) by mouth daily. 30 capsule 0  . docusate sodium (COLACE) 100 MG capsule Take 1 capsule (100 mg total) by mouth daily as needed for mild constipation. (Patient taking differently: Take 100 mg by mouth at bedtime. ) 60 capsule 3  . furosemide (LASIX) 20 MG tablet Take 1 tablet (20 mg total) by mouth 2 (two) times daily. (Patient taking differently: Take 20 mg by mouth daily as needed (for fluid retention). ) 60 tablet 0  . ibuprofen (ADVIL,MOTRIN) 200 MG tablet Take 400 mg by mouth every 6 (six) hours as needed for headache or moderate pain.    . nicotine (NICODERM CQ - DOSED IN MG/24 HOURS) 14 mg/24hr patch Place 1 patch (14 mg total) onto the skin daily. (Patient taking differently: Place 14 mg onto the skin daily as needed. ) 30 patch 10  . OXYGEN Inhale 2 L into the lungs daily.     . polyethylene glycol (MIRALAX / GLYCOLAX) packet Take 17 g by mouth at bedtime.     [name] umeclidinium-vilanterol (ANORO ELLIPTA) 62.5-25 MCG/INH AEPB Inhale 1 puff into the lungs daily. 60 each 10  . Vitamin D, Ergocalciferol, (DRISDOL) 50000 units CAPS  capsule Take 50,000 Units by mouth every 7 (seven) days. Wednesday     No current facility-administered medications for this visit.     Review of Systems General: fatigue, weakness, decreased appetite  HEENT: no complaints  Lungs: cough resolved; shortness of breath, decreased smoking to 4 cigs a day  Cardiac: a-fib resolved  GI: constipation   GU: no bleeding  Musculoskeletal: back pain  Extremities: no complaints  Skin: no complaints  Neuro: weakness,   Endocrine: no  complaints  Psych: no complaints       Objective:  Physical Examination:  BP 117/69 (BP Location: Left Wrist, Patient Position: Sitting)   Pulse 87   Temp 97 F (36.1 C) (Tympanic)   Ht 5\' 1"  (1.549 m)   Wt 220 lb 1.6 oz (99.8 kg)   BMI 41.59 kg/m    ECOG Performance Status: 0 - Asymptomatic  General appearance: alert, cooperative and appears stated age HEENT:PERRLA, extra ocular movement intact and sclera clear, anicteric. Abdomen: soft, appropriately tender mostly in RUQ, without masses or organomegaly and no hernias. Obese.  Back: inspection of back is normal Extremities: extremities normal, atraumatic, no cyanosis or edema  Skin: all incisions are well healed, no erythema or drainage Neurological exam reveals alert, oriented, normal speech, no focal findings or movement disorder noted.  Pelvic: exam chaperoned by nurse;  Vulva: normal appearing vulva with no masses, tenderness or lesions; Vagina: normal vagina, cuff well healed.   Recto/vaginal: no masses    Assessment:  Jessica Dalton is a 66 y.o. female with stage 1A, grade 1 endometrioid endometrial cancer.   Medical co-morbidities complicating care: asthma, tobacco use, and obesity. Additional issues include recent hospitalization for sepsis, pneumonia, COPD exacerbation,  dyspnea, and atrial fibrillation, now resolved. Symptomatic cholelithiasis. Constipation.  Plan:   Problem List Items Addressed This Visit      Genitourinary    Endometrial cancer ([place]) - Primary     With regard to endometrial cancer she has an excellent prognosis with early stage disease. Her risk of recurrence is less than 10%. I have recommended continued close follow up with exams, including pelvic exams every 6 months for 3-5 years and then annually thereafter.  She will see [place] again in 6 months and then we can start alternating with Dr. Ouida Sills.   We also provided information regarding constipation. I recommended using Miralax at least 1 capful every evening. If she does have diarrhea she can decrease the amount but do not stop completely as then she will have alternating diarrhea and constipation. She can titrate the dose as needed. She will continue Colace BID and I also encourage water intake and fiber rich cereals. She will continue to follow with her other providers for her other medical issues.   The patient's diagnosis, an outline of the further diagnostic and laboratory studies which will be required, the recommendation, and alternatives were discussed.  All questions were answered to the patient's satisfaction     Mellody Drown, MD  CC:  Boykin Nearing, MD [address] [facility] [place], [place] [address] [phone]  Dr. Dion Body

## 2017-04-07 IMAGING — CR DG CHEST 2V
1 series · 2 of 2 positions shown · non-contrast
Comparison: 01/15/2017

CLINICAL DATA: Recent pneumonia and pleural effusion, follow-up
exam

EXAM:
CHEST  2 VIEW

[Series 1: dg chest 2 view · 0.14mm/px · 2 of 2 slices shown]
[im 1/2]
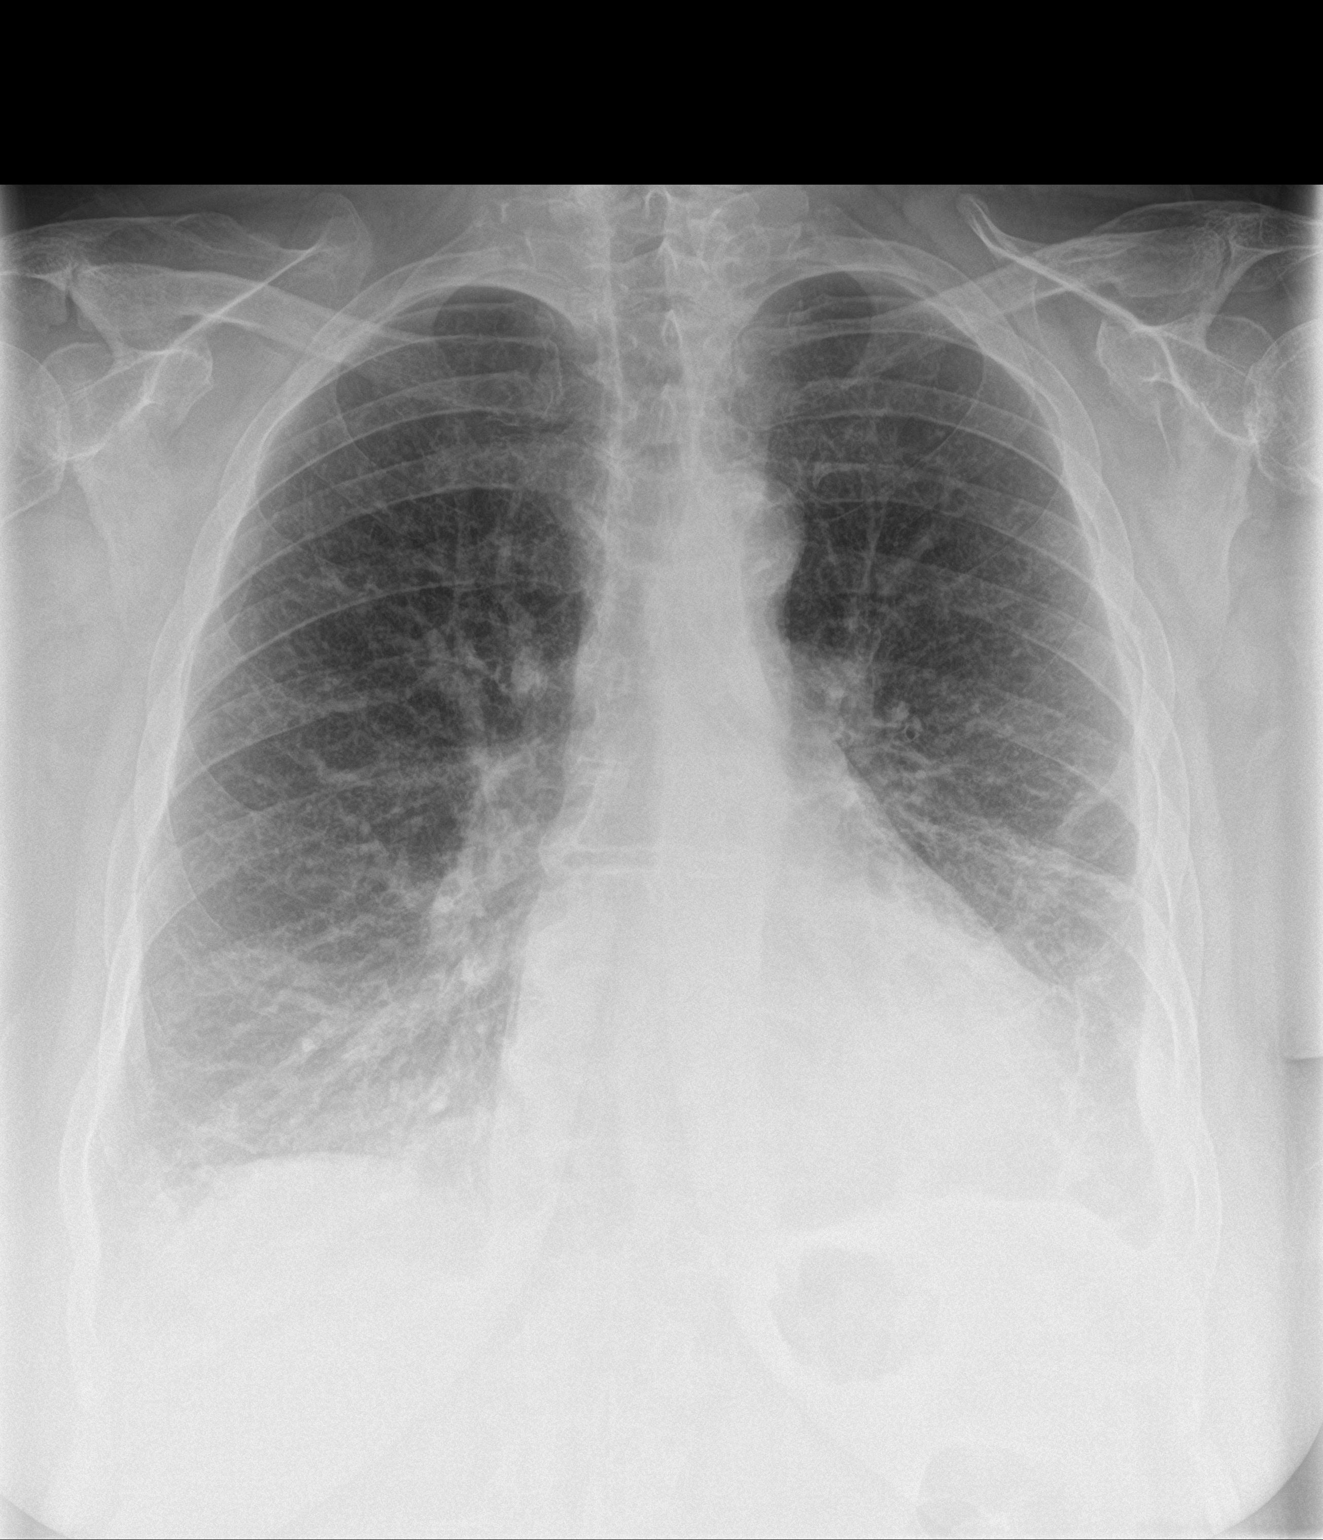
[im 2/2]
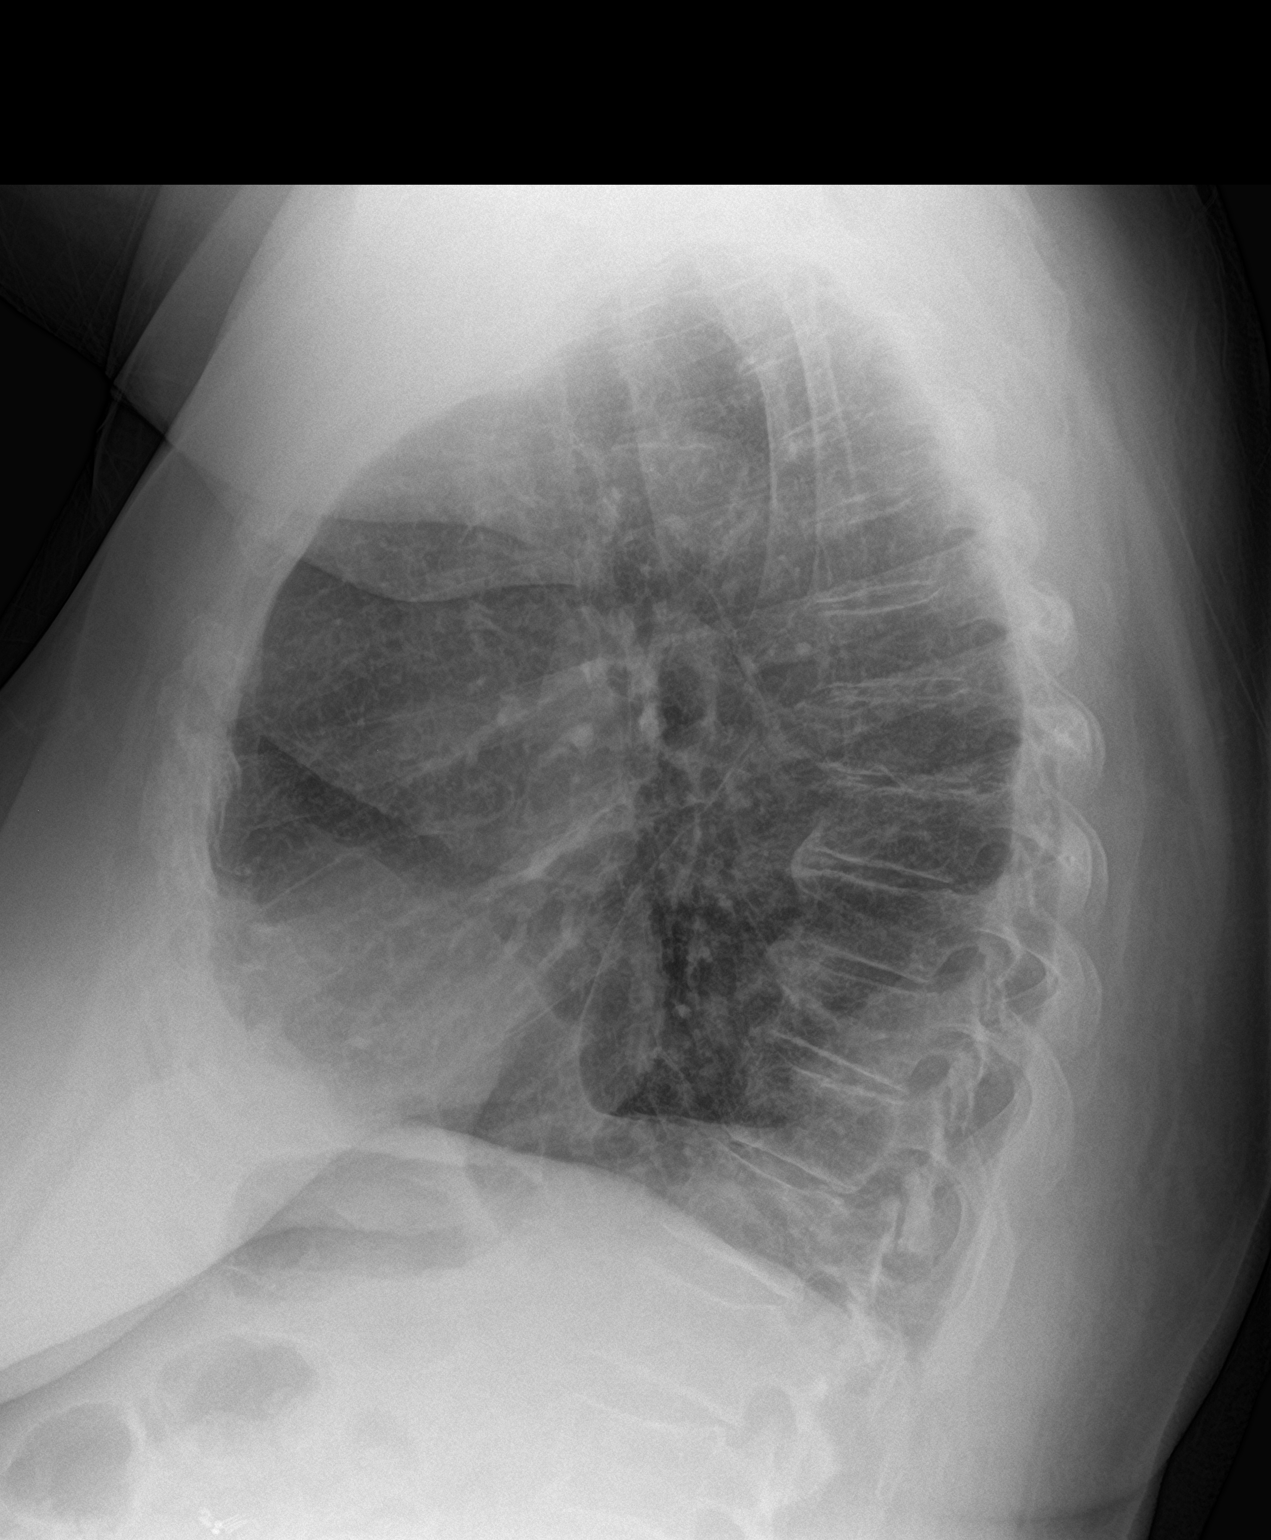

[2 of 2 positions shown; findings below may reference images not displayed]

FINDINGS: Cardiac shadow is stable. Previously seen left effusion has improved
significantly in the interval from the prior exam. Hyperinflation is
noted. Mild residual left basilar scarring is seen. A small residual
effusion is noted.
IMPRESSION: Improvement of left-sided pleural effusion when compare with the
prior exam. No acute infiltrate is seen.

## 2017-09-02 DIAGNOSIS — Z8542 Personal history of malignant neoplasm of other parts of uterus: Secondary | ICD-10-CM | POA: Insufficient documentation

## 2017-09-02 NOTE — Progress Notes (Signed)
Gynecologic Oncology Interval Visit   Referring Provider: Boykin Nearing, MD [address] [facility] [place],  [address] [phone]  Chief Concern: surveillance visit for endometrial cancer  Subjective:  Jessica Dalton is a 66 y.o. female who is seen in consultation from Dr. Ouida Sills for grade 1 endometrial cancer.  She was last seen by Dr. Fransisca Connors on 03/05/2017. She presents with complaints of intermittent diarrhea and constipation associated with abdominal pain and bloating. She is scheduled to undergo endoscopy and colonoscopy next month. She also complains of leg swelling and numbness and tingling that is long standing. She was diagnosed with pneumonia and had an abnormal chest CT.    Oncology history She was referred to Dr. Ouida Sills by Dr. Netty Starring for PMB.  grade 1 endometrial cancer. EMBx and Pap performed on 09/09/2016. Uterus sounded to 10 cm. 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 Pap NILM.   Dr. Ouida Sills also ordered a CT scan to evaluate upper abdominal pain symptoms.    On 10/16/2016 she underwent RA-TLH BSO and SLN injection, mapping and biopsy. Her intraoperative and postoperative course was significant for pulmonary issues. She had desaturation due to chronic pulmonary disease from smoking.   09/24/2016 preoperative CT scan A/P revealed the following: IMPRESSION: 1. Single borderline enlarged portal hepatic lymph node. No additional findings suspicious for or suggestive of metastatic disease. Further imaging with PET-CT may be helpful to assess for hyper metabolism within this lymph node. 2. Left adrenal nodule, indeterminate. This could be better assessed with adrenal protocol MRI or PET-CT. 3. Thickening of the endometrium which in a postmenopausal female is considered abnormal and consistent with the clinical history of endometrial sarcoma. Stones identified within the gallbladder are noted  measuring up to 3 cm.   I reviewed the findings preoperatively and spoke with Dr. Bary Castilla to see if he could be available for cholecystectomy as she was symptomatic with the gallstones. Unfortunately he was not available but recommended postoperative referral.  Preop CXR:  IMPRESSION: Hyperinflation and mild diffuse interstitial prominence consistent with the history of reactive airway disease. There is no pneumonia, CHF, nor other acute cardiopulmonary abnormality.  Aortic atherosclerosis.  Pathology noted as below:  Cytology: Negative  DIAGNOSIS:  A. SENTINEL LYMPH NODE, LEFT; EXCISION:  - NO TUMOR SEEN IN ONE LYMPH NODE (0/1).   B. SENTINEL LYMPH NODE, LEFT; EXCISION:  - NO TUMOR SEEN IN TWO LYMPH NODES (0/2).   C. UTERUS WITH CERVIX, BILATERAL FALLOPIAN TUBES AND OVARIES;  HYSTERECTOMY WITH BILATERAL SALPINGO-OOPHORECTOMY:  - ENDOMETRIOID ADENOCARCINOMA FIGO I.  - SURFACE DEGENERATIVE PAPILLARY FEATURES.  - MYOMETRIAL INVASION IS NOT IDENTIFIED.  - CHRONIC CERVICITIS.  - STROMAL HYPERPLASIA OF BILATERAL OVARIES.  - NO PATHOLOGIC CHANGE, BILATERAL FALLOPIAN TUBES.  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    ENDOMETRIUM: Hysterectomy, With or Without Other Organs or Tissues  Specimens InvolvedA: Sentinel lymph node, left  B: Sentinel lymph node, right proximal obturator  C: Uterus with cervix, bilateral tubes and ovaries   Endometrium, Hysterectomy, With or Without Other Organs or Tissues  Cancer Case Summary  Specimen: Uterine corpus  SPECIMEN  Procedure  Simple hysterectomy  Additional Procedures:  Bilateral salpingo-oophorectomy  Lymph Node Sampling:   Performed  Pelvic lymph nodes  Specimen Integrity: Intact hysterectomy specimen  TUMOR  Histologic Type:  Endometrioid adenocarcinoma, not otherwise  characterized  Histologic Grade:  FIGO grade 1  EXTENT  Tumor Size:  Greatest dimension (cm)  4.2cm  Myometrial  Invasion:   Not identified  Involvement of Cervix:  Not involved  Other Organs Submitted: Right ovary  Not involved  Left ovary  Not involved  Right fallopian tube  Not involved  Left fallopian tube  Not involved  ACCESSORY FINDINGS  Lymph-Vascular Invasion: Not identified  STAGE (pTNM [FIGO])  Primary Tumor (pT):  pT1a [IA]: Tumor limited to endometrium or invades less than one-half  of the myometrium  Regional Lymph Nodes (pN)  pN0: No regional lymph node metastasis  Pelvic Lymph Nodes: Number of Lymph Nodes Examined:  Specify  3  Number of Lymph Nodes Involved:  Specify  0  Para-aortic Lymph Nodes: No para-aortic nodes submitted or found  Distant Metastasis (pM): Not applicable   Since surgery she was seen by Dr. Bary Castilla on 11/06/2016 with the plan to proceed with Laparoscopic Cholecystectomy with Intraoperative Cholangiogram. She had a consult with Dr. Rogue Bussing for erythocytosis - work up still ongoing. She was admitted on 11/11/2016 throught 11/15/2016 with sepsis, pneumonia, COPD exacerbation,  dyspnea, and new onset atrial fibrillation. She is being treated by Dr. Netty Starring and Dr. Clayborn Bigness.  Her cholecystectomy was canceled due to her other comorbidities.   In 2/18 Dr Bary Castilla performed LS cholecystectomy without incident.    Problem List: Patient Active Problem List   Diagnosis Date Noted  . History of endometrial cancer 09/02/2017  . Pleural effusion 01/14/2017  . Constipation 12/04/2016  . Atrial fibrillation with RVR ([place]) 11/11/2016  . Erythrocytosis 11/08/2016  . Cigarette smoker   . Gallstones 10/16/2016  . Endometrial cancer determined by uterine biopsy ([place]) 10/16/2016  . Endometrial cancer ([place]) 09/18/2016    Past Medical History: Past Medical History:  Diagnosis Date  . A-fib ([place])   . Anemia   . Asthma   . Cancer ([place])    uterine ca  . COPD (chronic obstructive pulmonary disease) ([place])   . Endometrial cancer ([place])   . Gallstones  10/16/2016  . Obesity   . Oxygen dependent   . Pneumonia   . Tobacco use     Past Surgical History: Past Surgical History:  Procedure Laterality Date  . ABDOMINAL HYSTERECTOMY    . CATARACT EXTRACTION, BILATERAL  2015  . CHOLECYSTECTOMY N/A 01/23/2017   Procedure: LAPAROSCOPIC CHOLECYSTECTOMY WITH INTRAOPERATIVE CHOLANGIOGRAM;  Surgeon: Robert Bellow, MD;  Location: [facility] ORS;  Service: General;  Laterality: N/A;  . PELVIC LYMPH NODE DISSECTION N/A 10/16/2016   Procedure: PELVIC LYMPH NODE DISSECTION;  Surgeon: Gillis Ends, MD;  Location: [facility] ORS;  Service: Gynecology;  Laterality: N/A;  . ROBOTIC ASSISTED TOTAL HYSTERECTOMY WITH BILATERAL SALPINGO OOPHERECTOMY Bilateral 10/16/2016   Procedure: ROBOTIC ASSISTED TOTAL HYSTERECTOMY WITH BILATERAL SALPINGO OOPHORECTOMY;  Surgeon: Gillis Ends, MD;  Location: [facility] ORS;  Service: Gynecology;  Laterality: Bilateral;  . SENTINEL NODE BIOPSY N/A 10/16/2016   Procedure: SENTINEL NODE INJECTION;  Surgeon: Gillis Ends, MD;  Location: [facility] ORS;  Service: Gynecology;  Laterality: N/A;  . TUBAL LIGATION      Past Gynecologic History:  Menarche: unknown Menstrual details: postmenopausal Last Menstrual Period: 15 years ago History of OCP/HRT use: None History of Abnormal pap: no, benign cellular changes Last pap: as per HPI   OB History:  OB History  Gravida Para Term Preterm AB Living  3 3 3     3   SAB TAB Ectopic Multiple Live Births               # Outcome Date GA Lbr Len/2nd Weight Sex  Delivery Anes PTL Lv  3 Term           2 Term           1 Term             Obstetric Comments  1st Menstrual Cycle:  ?   1st Pregnancy:  30      Family History: Family History  Problem Relation Age of Onset  . Cancer Mother        renal cell cancer early 36's  . Cancer Father        colon cancer late 24's   . Cancer Sister        melanoma  age 21; cervical vs endometrial cancer age?    Social  History: Social History   Social History  . Marital status: Married    Spouse name: N/A  . Number of children: N/A  . Years of education: N/A   Occupational History  . Not on file.   Social History Main Topics  . Smoking status: Current Every Day Smoker    Packs/day: 0.50    Years: 50.00  . Smokeless tobacco: Never Used     Comment: 75 pk yr - 1.5 ppd; 28 years; started age 45  . Alcohol use No  . Drug use: No  . Sexual activity: Not on file   Other Topics Concern  . Not on file   Social History Narrative  . No narrative on file    Allergies: Allergies  Allergen Reactions  . Bismuth Subsalicylate Nausea And Vomiting    Current Medications: Current Outpatient Prescriptions  Medication Sig Dispense Refill  . acetaminophen (TYLENOL) 500 MG tablet Take 1,500 mg by mouth daily as needed for mild pain.    [name] albuterol (PROVENTIL HFA;VENTOLIN HFA) 108 (90 Base) MCG/ACT inhaler Inhale 2 puffs into the lungs every 6 (six) hours as needed for wheezing or shortness of breath. 1 Inhaler 1  . aspirin EC 81 MG EC tablet Take 1 tablet (81 mg total) by mouth daily.    . Calcium Carbonate-Vitamin D (OYSTER SHELL CALCIUM/D) 500-400 MG-UNIT TABS Take 1 tablet by mouth daily.     [name] diltiazem (CARDIZEM CD) 120 MG 24 hr capsule Take 1 capsule (120 mg total) by mouth daily. 30 capsule 0  . docusate sodium (COLACE) 100 MG capsule Take 1 capsule (100 mg total) by mouth daily as needed for mild constipation. (Patient taking differently: Take 100 mg by mouth at bedtime. ) 60 capsule 3  . furosemide (LASIX) 20 MG tablet Take 1 tablet (20 mg total) by mouth 2 (two) times daily. (Patient taking differently: Take 20 mg by mouth daily as needed (for fluid retention). ) 60 tablet 0  . ibuprofen (ADVIL,MOTRIN) 200 MG tablet Take 400 mg by mouth every 6 (six) hours as needed for headache or moderate pain.    [name] linaclotide (LINZESS) 72 MCG capsule Take 72 mcg by mouth daily before breakfast.    .  polyethylene glycol (MIRALAX / GLYCOLAX) packet Take 17 g by mouth at bedtime.     [name] umeclidinium-vilanterol (ANORO ELLIPTA) 62.5-25 MCG/INH AEPB Inhale 1 puff into the lungs daily. 60 each 10   No current facility-administered medications for this visit.     Review of Systems General: fatigue  HEENT: no complaints  Lungs: as per history  Cardiac: a-fib resolved  GI: as per history  GU: no bleeding  Musculoskeletal: back pain  Extremities: no complaints  Skin: no complaints  Neuro: weakness,  Endocrine: no complaints  Psych: no complaints       Objective:  Physical Examination:  BP 133/77   Pulse 69   Temp (!) 97.2 F (36.2 C) (Tympanic)   Resp 18   Ht 5\' 1"  (1.549 m)   Wt 231 lb 11.2 oz (105.1 kg)   BMI 43.78 kg/m    ECOG Performance Status: 0 - Asymptomatic  General appearance: alert, cooperative and appears stated age HEENT:PERRLA, extra ocular movement intact and sclera clear, anicteric. CV: RRR Lungs: Wheezing on expiration bilaterally upper lobes; BS otherwise normal and good air movement.  Abdomen: soft, appropriately tender mostly in RUQ, without masses or organomegaly and no hernias. Obese.  Back: inspection of back is normal Extremities: extremities normal, atraumatic, no cyanosis or edema  Skin: all incisions are well healed, no erythema or drainage Neurological exam reveals alert, oriented, normal speech, no focal findings or movement disorder noted.  Pelvic: exam chaperoned by nurse;  Vulva: normal appearing vulva with no masses, tenderness or lesions, but erythematous; Vagina: normal vagina, cuff well healed. Uterus/cervix surgically absent.  Recto/vaginal: no masses    Assessment:  Jessica Dalton is a 66 y.o. female with stage 1A, grade 1 endometrioid endometrial cancer.  Abdominal symptoms may be GI related but differential also includes recurrence.   Preoperative imaging revealed single borderline enlarged portal hepatic lymph node and left  adrenal nodule.   History of pneumonia with abnormal CT scan in the setting of active tobacco use.   Vulvitis and skin fold dermatitis.   Medical co-morbidities complicating care: asthma, tobacco use, and obesity. Additional issues include recent hospitalization for sepsis, pneumonia, COPD exacerbation,  dyspnea, and atrial fibrillation, now resolved. Symptomatic cholelithiasis. Constipation.  Plan:   Problem List Items Addressed This Visit      Other   History of endometrial cancer - Primary   Relevant Orders   Creatinine, serum    Other Visit Diagnoses    Abdominal distension (gaseous)       Relevant Orders   Creatinine, serum   CT Abdomen Pelvis W Contrast     We will order CT scan to assess for recurrence as well as follow up single borderline enlarged portal hepatic lymph node and left adrenal nodule. If negative we will continue to follow as recommended below.    With regard to endometrial cancer she has an excellent prognosis with early stage disease. Her risk of recurrence is less than 10%. I have recommended continued close follow up with exams, including pelvic exams every 6 months for 3-5 years and then annually thereafter.  She will see [place] again in 6 months and then we can start alternating with Dr. Ouida Sills.   Refer for CT lung cancer screening with Burgess Estelle, RN.  Nystatin for dermatitis.    The patient's diagnosis, an outline of the further diagnostic and laboratory studies which will be required, the recommendation, and alternatives were discussed.  All questions were answered to the patient's satisfaction    Patient seen with Beckey Rutter, NP  Gillis Ends, MD  CC:  Boykin Nearing, MD [address] [facility] [place], [place] [address] [phone]  Dr. Dion Body

## 2017-09-03 ENCOUNTER — Inpatient Hospital Stay: Payer: Medicare HMO

## 2017-09-03 ENCOUNTER — Inpatient Hospital Stay: Payer: Medicare HMO | Attending: Obstetrics and Gynecology | Admitting: Obstetrics and Gynecology

## 2017-09-03 VITALS — BP 133/77 | HR 69 | Temp 97.2°F | Resp 18 | Ht 61.0 in | Wt 231.7 lb

## 2017-09-03 DIAGNOSIS — R140 Abdominal distension (gaseous): Secondary | ICD-10-CM | POA: Diagnosis not present

## 2017-09-03 DIAGNOSIS — I4891 Unspecified atrial fibrillation: Secondary | ICD-10-CM | POA: Insufficient documentation

## 2017-09-03 DIAGNOSIS — Z9981 Dependence on supplemental oxygen: Secondary | ICD-10-CM | POA: Insufficient documentation

## 2017-09-03 DIAGNOSIS — I700 Atherosclerosis of aorta: Secondary | ICD-10-CM

## 2017-09-03 DIAGNOSIS — K5900 Constipation, unspecified: Secondary | ICD-10-CM | POA: Insufficient documentation

## 2017-09-03 DIAGNOSIS — F17210 Nicotine dependence, cigarettes, uncomplicated: Secondary | ICD-10-CM | POA: Insufficient documentation

## 2017-09-03 DIAGNOSIS — Z7982 Long term (current) use of aspirin: Secondary | ICD-10-CM | POA: Insufficient documentation

## 2017-09-03 DIAGNOSIS — Z79899 Other long term (current) drug therapy: Secondary | ICD-10-CM | POA: Insufficient documentation

## 2017-09-03 DIAGNOSIS — Z90710 Acquired absence of both cervix and uterus: Secondary | ICD-10-CM | POA: Insufficient documentation

## 2017-09-03 DIAGNOSIS — Z90722 Acquired absence of ovaries, bilateral: Secondary | ICD-10-CM | POA: Diagnosis not present

## 2017-09-03 DIAGNOSIS — L309 Dermatitis, unspecified: Secondary | ICD-10-CM

## 2017-09-03 DIAGNOSIS — Z8542 Personal history of malignant neoplasm of other parts of uterus: Secondary | ICD-10-CM

## 2017-09-03 DIAGNOSIS — C541 Malignant neoplasm of endometrium: Secondary | ICD-10-CM | POA: Diagnosis present

## 2017-09-03 DIAGNOSIS — J90 Pleural effusion, not elsewhere classified: Secondary | ICD-10-CM | POA: Diagnosis not present

## 2017-09-03 DIAGNOSIS — N762 Acute vulvitis: Secondary | ICD-10-CM | POA: Insufficient documentation

## 2017-09-03 DIAGNOSIS — R197 Diarrhea, unspecified: Secondary | ICD-10-CM | POA: Insufficient documentation

## 2017-09-03 DIAGNOSIS — L308 Other specified dermatitis: Secondary | ICD-10-CM

## 2017-09-03 LAB — CREATININE, SERUM
Creatinine, Ser: 0.56 mg/dL (ref 0.44–1.00)
GFR calc non Af Amer: >60 mL/min (ref >60)

## 2017-09-03 MED ORDER — NYSTATIN 100000 UNIT/GM EX POWD: Freq: Three times a day (TID) | CUTANEOUS | 0 refills | Status: DC

## 2017-09-03 NOTE — Patient Instructions (Addendum)
Prescription called in for Nystatin powder. You can use this on the reddened skin fold areas 3 times a day.   Make appointment to see Dr. Ouida Sills in 3 months.

## 2017-09-03 NOTE — Progress Notes (Signed)
She does have abdominal problems; constipation, diarrhea, bloating. She is going for colonscopy and endoscopy next week.[name]

## 2017-09-05 ENCOUNTER — Encounter: Payer: Self-pay | Admitting: *Deleted

## 2017-09-08 ENCOUNTER — Ambulatory Visit: Payer: Medicare HMO | Admitting: Anesthesiology

## 2017-09-08 ENCOUNTER — Ambulatory Visit
Admission: RE | Admit: 2017-09-08 | Discharge: 2017-09-08 | Disposition: A | Discharge status: Home / Self Care | Payer: Medicare HMO | Source: Ambulatory Visit | Attending: Unknown Physician Specialty | Admitting: Unknown Physician Specialty

## 2017-09-08 ENCOUNTER — Encounter
Admission: RE | Disposition: A | Discharge status: Home / Self Care | Payer: Self-pay | Source: Ambulatory Visit | Attending: Unknown Physician Specialty

## 2017-09-08 DIAGNOSIS — G4730 Sleep apnea, unspecified: Secondary | ICD-10-CM | POA: Diagnosis not present

## 2017-09-08 DIAGNOSIS — Z9981 Dependence on supplemental oxygen: Secondary | ICD-10-CM | POA: Diagnosis not present

## 2017-09-08 DIAGNOSIS — D123 Benign neoplasm of transverse colon: Secondary | ICD-10-CM | POA: Diagnosis not present

## 2017-09-08 DIAGNOSIS — D127 Benign neoplasm of rectosigmoid junction: Secondary | ICD-10-CM | POA: Diagnosis not present

## 2017-09-08 DIAGNOSIS — D122 Benign neoplasm of ascending colon: Secondary | ICD-10-CM | POA: Insufficient documentation

## 2017-09-08 DIAGNOSIS — D120 Benign neoplasm of cecum: Secondary | ICD-10-CM | POA: Diagnosis not present

## 2017-09-08 DIAGNOSIS — K210 Gastro-esophageal reflux disease with esophagitis: Secondary | ICD-10-CM | POA: Insufficient documentation

## 2017-09-08 DIAGNOSIS — K449 Diaphragmatic hernia without obstruction or gangrene: Secondary | ICD-10-CM | POA: Insufficient documentation

## 2017-09-08 DIAGNOSIS — F17200 Nicotine dependence, unspecified, uncomplicated: Secondary | ICD-10-CM | POA: Diagnosis not present

## 2017-09-08 DIAGNOSIS — Z1211 Encounter for screening for malignant neoplasm of colon: Secondary | ICD-10-CM | POA: Diagnosis not present

## 2017-09-08 DIAGNOSIS — J449 Chronic obstructive pulmonary disease, unspecified: Secondary | ICD-10-CM | POA: Insufficient documentation

## 2017-09-08 DIAGNOSIS — K2950 Unspecified chronic gastritis without bleeding: Secondary | ICD-10-CM | POA: Diagnosis not present

## 2017-09-08 DIAGNOSIS — D125 Benign neoplasm of sigmoid colon: Secondary | ICD-10-CM | POA: Diagnosis not present

## 2017-09-08 DIAGNOSIS — Z7901 Long term (current) use of anticoagulants: Secondary | ICD-10-CM | POA: Diagnosis not present

## 2017-09-08 DIAGNOSIS — Z8542 Personal history of malignant neoplasm of other parts of uterus: Secondary | ICD-10-CM | POA: Insufficient documentation

## 2017-09-08 DIAGNOSIS — R1013 Epigastric pain: Secondary | ICD-10-CM | POA: Diagnosis present

## 2017-09-08 DIAGNOSIS — I4891 Unspecified atrial fibrillation: Secondary | ICD-10-CM | POA: Diagnosis not present

## 2017-09-08 DIAGNOSIS — Z800 Family history of malignant neoplasm of digestive organs: Secondary | ICD-10-CM | POA: Diagnosis not present

## 2017-09-08 DIAGNOSIS — E669 Obesity, unspecified: Secondary | ICD-10-CM | POA: Insufficient documentation

## 2017-09-08 DIAGNOSIS — Z79899 Other long term (current) drug therapy: Secondary | ICD-10-CM | POA: Diagnosis not present

## 2017-09-08 DIAGNOSIS — Z6841 Body Mass Index (BMI) 40.0 and over, adult: Secondary | ICD-10-CM | POA: Diagnosis not present

## 2017-09-08 HISTORY — PX: COLONOSCOPY WITH PROPOFOL: SHX5780

## 2017-09-08 HISTORY — DX: Cough: R05

## 2017-09-08 HISTORY — DX: Hypoxemia: R09.02

## 2017-09-08 HISTORY — PX: ESOPHAGOGASTRODUODENOSCOPY (EGD) WITH PROPOFOL: SHX5813

## 2017-09-08 HISTORY — DX: Tachycardia, unspecified: R00.0

## 2017-09-08 HISTORY — DX: Chronic cough: R05.3

## 2017-09-08 HISTORY — DX: Sleep apnea, unspecified: G47.30

## 2017-09-08 SURGERY — COLONOSCOPY WITH PROPOFOL
Anesthesia: General

## 2017-09-08 MED ORDER — PROPOFOL 500 MG/50ML IV EMUL
INTRAVENOUS | Status: AC
  Filled 2017-09-08: qty 50

## 2017-09-08 MED ORDER — PROPOFOL 10 MG/ML IV BOLUS
INTRAVENOUS | Status: AC
  Filled 2017-09-08: qty 20

## 2017-09-08 MED ORDER — PROPOFOL 10 MG/ML IV BOLUS
INTRAVENOUS | Status: DC | PRN
  Administered 2017-09-08: 90 mg via INTRAVENOUS

## 2017-09-08 MED ORDER — FENTANYL CITRATE (PF) 100 MCG/2ML IJ SOLN
INTRAMUSCULAR | Status: DC | PRN
  Administered 2017-09-08 (×2): 50 ug via INTRAVENOUS

## 2017-09-08 MED ORDER — FENTANYL CITRATE (PF) 100 MCG/2ML IJ SOLN
INTRAMUSCULAR | Status: AC
  Filled 2017-09-08: qty 2

## 2017-09-08 MED ORDER — PHENYLEPHRINE HCL 10 MG/ML IJ SOLN
INTRAMUSCULAR | Status: DC | PRN
  Administered 2017-09-08 (×4): 50 ug via INTRAVENOUS

## 2017-09-08 MED ORDER — SODIUM CHLORIDE 0.9 % IV SOLN
INTRAVENOUS | Status: DC
  Administered 2017-09-08 (×2): via INTRAVENOUS

## 2017-09-08 MED ORDER — SODIUM CHLORIDE 0.9 % IV SOLN: INTRAVENOUS | Status: DC

## 2017-09-08 MED ORDER — PROPOFOL 500 MG/50ML IV EMUL
INTRAVENOUS | Status: DC | PRN
  Administered 2017-09-08: 100 ug/kg/min via INTRAVENOUS

## 2017-09-08 NOTE — Anesthesia Post-op Follow-up Note (Signed)
Anesthesia QCDR form completed.        

## 2017-09-08 NOTE — Anesthesia Preprocedure Evaluation (Signed)
Anesthesia Evaluation  Patient identified by MRN, date of birth, ID band Patient awake    Reviewed: Allergy & Precautions, NPO status , Patient's Chart, lab work & pertinent test results  History of Anesthesia Complications Negative for: history of anesthetic complications  Airway Mallampati: III  TM Distance: >3 FB Neck ROM: Full    Dental  (+) Missing, Poor Dentition   Pulmonary asthma , sleep apnea , COPD,  COPD inhaler, Current Smoker,    breath sounds clear to auscultation- rhonchi (-) wheezing      Cardiovascular Exercise Tolerance: Good (-) hypertension(-) CAD, (-) Past MI and (-) Cardiac Stents + dysrhythmias Atrial Fibrillation  Rhythm:Regular Rate:Normal - Systolic murmurs and - Diastolic murmurs    Neuro/Psych negative neurological ROS     GI/Hepatic negative GI ROS, Neg liver ROS,   Endo/Other  negative endocrine ROSneg diabetes  Renal/GU negative Renal ROS     Musculoskeletal negative musculoskeletal ROS (+)   Abdominal (+) + obese,   Peds  Hematology  (+) anemia ,   Anesthesia Other Findings Past Medical History: No date: A-fib ([facility]) No date: Anemia No date: Asthma No date: Cancer ([place])     Comment:  uterine ca No date: Chronic cough No date: COPD (chronic obstructive pulmonary disease) ([facility]) No date: Endometrial cancer ([place]) 10/16/2016: Gallstones No date: Hypoxemia No date: Obesity No date: Oxygen dependent No date: Pneumonia No date: Sleep apnea No date: Tachycardia No date: Tobacco use   Reproductive/Obstetrics                             Anesthesia Physical Anesthesia Plan  ASA: III  Anesthesia Plan: General   Post-op Pain Management:    Induction: Intravenous  PONV Risk Score and Plan: 1 and Propofol infusion  Airway Management Planned: Natural Airway  Additional Equipment:   Intra-op Plan:   Post-operative Plan:   Informed Consent: I have  reviewed the patients History and Physical, chart, labs and discussed the procedure including the risks, benefits and alternatives for the proposed anesthesia with the patient or authorized representative who has indicated his/her understanding and acceptance.   Dental advisory given  Plan Discussed with: CRNA and Anesthesiologist  Anesthesia Plan Comments:         Anesthesia Quick Evaluation

## 2017-09-08 NOTE — Anesthesia Postprocedure Evaluation (Signed)
Anesthesia Post Note  Patient: Jessica Dalton  Procedure(s) Performed: COLONOSCOPY WITH PROPOFOL (N/A ) ESOPHAGOGASTRODUODENOSCOPY (EGD) WITH PROPOFOL (N/A )  Patient location during evaluation: Endoscopy Anesthesia Type: General Level of consciousness: awake and alert and oriented Pain management: pain level controlled Vital Signs Assessment: post-procedure vital signs reviewed and stable Respiratory status: spontaneous breathing, nonlabored ventilation and respiratory function stable Cardiovascular status: blood pressure returned to baseline and stable Postop Assessment: no signs of nausea or vomiting Anesthetic complications: no     Last Vitals:  Vitals:   09/08/17 1152 09/08/17 1202  BP: (!) 131/57 109/86  Pulse: 72 75  Resp: 12 18  Temp:    SpO2: 98% 100%    Last Pain:  Vitals:   09/08/17 1132  TempSrc: Tympanic  PainSc:                  Olivya Sobol

## 2017-09-08 NOTE — Transfer of Care (Signed)
Immediate Anesthesia Transfer of Care Note  Patient: Jessica Dalton  Procedure(s) Performed: COLONOSCOPY WITH PROPOFOL (N/A ) ESOPHAGOGASTRODUODENOSCOPY (EGD) WITH PROPOFOL (N/A )  Patient Location: PACU and Endoscopy Unit  Anesthesia Type:General  Level of Consciousness: drowsy and patient cooperative  Airway & Oxygen Therapy: Patient Spontanous Breathing and Patient connected to nasal cannula oxygen  Post-op Assessment: Report given to RN and Post -op Vital signs reviewed and stable  Post vital signs: Reviewed and stable  Last Vitals:  Vitals:   09/08/17 0946 09/08/17 1132  BP: 138/72 (!) 102/45  Pulse: 84 73  Resp: (!) 22 14  Temp: 36.7 C (!) 35.8 C  SpO2: 99% 98%    Last Pain:  Vitals:   09/08/17 1132  TempSrc: Tympanic  PainSc:          Complications: No apparent anesthesia complications

## 2017-09-08 NOTE — Op Note (Signed)
[facility] Patient Name: Jessica Dalton Procedure Date: 09/08/2017 9:57 AM MRN: [id number] Account #: [id number] Date of Birth: 1951-10-12 Admit Type: Outpatient Age: 66 Room: [facility] ENDO ROOM 3 Gender: Female Note Status: Finalized Procedure:            Upper GI endoscopy Indications:          Epigastric abdominal pain, Heartburn Providers:            Manya Silvas, MD Referring MD:         Dion Body (Referring MD) Medicines:            Propofol per Anesthesia Complications:        No immediate complications. Procedure:            Pre-Anesthesia Assessment:                       - After reviewing the risks and benefits, the patient                        was deemed in satisfactory condition to undergo the                        procedure.                       After obtaining informed consent, the endoscope was                        passed under direct vision. Throughout the procedure,                        the patient's blood pressure, pulse, and oxygen                        saturations were monitored continuously. The Endoscope                        was introduced through the mouth, and advanced to the                        second part of duodenum. The upper GI endoscopy was                        accomplished without difficulty. The patient tolerated                        the procedure well. Findings:      LA Grade A (one or more mucosal breaks less than 5 mm, not extending       between tops of 2 mucosal folds) esophagitis with no bleeding was found       40 cm from the incisors. Biopsies were taken with a cold forceps for       histology.      Diffuse mild inflammation characterized by congestion (edema), erythema       and granularity was found in the proximal gastric antrum. Biopsies were       taken with a cold forceps for histology. Biopsies were taken with a cold       forceps for Helicobacter pylori testing.  Diffuse mild inflammation characterized by erythema and granularity was  found in the gastric body. Biopsies were taken with a cold forceps for       histology. Biopsies were taken with a cold forceps for Helicobacter       pylori testing.      The examined duodenum was normal. Impression:           - LA Grade A reflux esophagitis. Rule out Barrett's                        esophagus. Biopsied.                       - Gastritis. Biopsied.                       - Gastritis. Biopsied.                       - Normal examined duodenum. Recommendation:       - Await pathology results. Manya Silvas, MD 09/08/2017 10:15:06 AM This report has been signed electronically. Number of Addenda: 0 Note Initiated On: 09/08/2017 9:57 AM      [facility]

## 2017-09-08 NOTE — H&P (Signed)
Primary Care Physician:  Dion Body, MD Primary Gastroenterologist:  Dr. Vira Agar  Pre-Procedure History & Physical: HPI:  Jessica Dalton is a 66 y.o. female is here for an endoscopy and colonoscopy.   Past Medical History:  Diagnosis Date  . A-fib ([place])   . Anemia   . Asthma   . Cancer ([place])    uterine ca  . Chronic cough   . COPD (chronic obstructive pulmonary disease) ([place])   . Endometrial cancer ([place])   . Gallstones 10/16/2016  . Hypoxemia   . Obesity   . Oxygen dependent   . Pneumonia   . Sleep apnea   . Tachycardia   . Tobacco use     Past Surgical History:  Procedure Laterality Date  . ABDOMINAL HYSTERECTOMY    . CATARACT EXTRACTION, BILATERAL  2015  . CHOLECYSTECTOMY N/A 01/23/2017   Procedure: LAPAROSCOPIC CHOLECYSTECTOMY WITH INTRAOPERATIVE CHOLANGIOGRAM;  Surgeon: Robert Bellow, MD;  Location: [facility] ORS;  Service: General;  Laterality: N/A;  . PELVIC LYMPH NODE DISSECTION N/A 10/16/2016   Procedure: PELVIC LYMPH NODE DISSECTION;  Surgeon: Gillis Ends, MD;  Location: [facility] ORS;  Service: Gynecology;  Laterality: N/A;  . ROBOTIC ASSISTED TOTAL HYSTERECTOMY WITH BILATERAL SALPINGO OOPHERECTOMY Bilateral 10/16/2016   Procedure: ROBOTIC ASSISTED TOTAL HYSTERECTOMY WITH BILATERAL SALPINGO OOPHORECTOMY;  Surgeon: Gillis Ends, MD;  Location: [facility] ORS;  Service: Gynecology;  Laterality: Bilateral;  . SENTINEL NODE BIOPSY N/A 10/16/2016   Procedure: SENTINEL NODE INJECTION;  Surgeon: Gillis Ends, MD;  Location: [facility] ORS;  Service: Gynecology;  Laterality: N/A;  . TUBAL LIGATION      Prior to Admission medications   Medication Sig Start Date End Date Taking? Authorizing Provider  acetaminophen (TYLENOL) 500 MG tablet Take 1,500 mg by mouth daily as needed for mild pain.   Yes [provider]  albuterol (PROVENTIL HFA;VENTOLIN HFA) 108 (90 Base) MCG/ACT inhaler Inhale 2 puffs into the lungs every 6 (six) hours as needed for  wheezing or shortness of breath. 11/15/16  Yes Gouru, Illene Silver, MD  apixaban (ELIQUIS) 5 MG TABS tablet Take 5 mg by mouth 2 (two) times daily.   Yes [provider]  ibuprofen (ADVIL,MOTRIN) 200 MG tablet Take 400 mg by mouth every 6 (six) hours as needed for headache or moderate pain.   Yes [provider]  linaclotide (LINZESS) 72 MCG capsule Take 72 mcg by mouth daily before breakfast.   Yes [provider]  nystatin (MYCOSTATIN/NYSTOP) powder Apply topically 3 (three) times daily. 09/03/17  Yes Verlon Au, NP  pantoprazole (PROTONIX) 40 MG tablet Take 40 mg by mouth 2 (two) times daily.   Yes [provider]  umeclidinium-vilanterol (ANORO ELLIPTA) 62.5-25 MCG/INH AEPB Inhale 1 puff into the lungs daily. 11/07/16  Yes Wilhelmina Mcardle, MD  aspirin EC 81 MG EC tablet Take 1 tablet (81 mg total) by mouth daily. 11/16/16   Gouru, Illene Silver, MD  Calcium Carbonate-Vitamin D (OYSTER SHELL CALCIUM/D) 500-400 MG-UNIT TABS Take 1 tablet by mouth daily.     [provider]  diltiazem (CARDIZEM CD) 120 MG 24 hr capsule Take 1 capsule (120 mg total) by mouth daily. 11/16/16   Nicholes Mango, MD  docusate sodium (COLACE) 100 MG capsule Take 1 capsule (100 mg total) by mouth daily as needed for mild constipation. Patient taking differently: Take 100 mg by mouth at bedtime.  10/18/16   Benjaman Kindler, MD  furosemide (LASIX) 20 MG tablet Take 1 tablet (20 mg total)  by mouth 2 (two) times daily. Patient taking differently: Take 20 mg by mouth daily as needed (for fluid retention).  11/15/16   Gouru, Illene Silver, MD  polyethylene glycol (MIRALAX / GLYCOLAX) packet Take 17 g by mouth at bedtime.     [provider]    Allergies as of 06/13/2017  . (No Known Allergies)    Family History  Problem Relation Age of Onset  . Cancer Mother        renal cell cancer early 13's  . Cancer Father        colon cancer late 81's   . Cancer Sister        melanoma  age 75;  cervical vs endometrial cancer age?    Social History   Social History  . Marital status: Married    Spouse name: N/A  . Number of children: N/A  . Years of education: N/A   Occupational History  . Not on file.   Social History Main Topics  . Smoking status: Current Every Day Smoker    Packs/day: 0.50    Years: 50.00  . Smokeless tobacco: Never Used     Comment: 75 pk yr - 1.5 ppd; 61 years; started age 33  . Alcohol use No  . Drug use: No  . Sexual activity: Not on file   Other Topics Concern  . Not on file   Social History Narrative  . No narrative on file    Review of Systems: See HPI, otherwise negative ROS  Physical Exam: BP 138/72   Pulse 84   Temp 98 F (36.7 C) (Tympanic)   Resp (!) 22   Ht 5\' 1"  (1.549 m)   Wt 104.3 kg (230 lb)   SpO2 99%   BMI 43.46 kg/m  General:   Alert,  pleasant and cooperative in NAD Head:  Normocephalic and atraumatic. Neck:  Supple; no masses or thyromegaly. Lungs:  Clear throughout to auscultation.    Heart:  Regular rate and rhythm. Abdomen:  Soft, nontender and nondistended. Normal bowel sounds, without guarding, and without rebound.   Neurologic:  Alert and  oriented x4;  grossly normal neurologically.  Impression/Plan: Jessica Dalton is here for an endoscopy and colonoscopy to be performed for bilateral abdominal pain.  Risks, benefits, limitations, and alternatives regarding  endoscopy and colonoscopy have been reviewed with the patient.  Questions have been answered.  All parties agreeable.   Gaylyn Cheers, MD  09/08/2017, 9:56 AM

## 2017-09-08 NOTE — Op Note (Signed)
[facility] Gastroenterology Patient Name: Jessica Dalton Procedure Date: 09/08/2017 9:57 AM MRN: [id number] Account #: [id number] Date of Birth: 06/02/1951 Admit Type: Outpatient Age: 66 Room: [facility] ENDO ROOM 3 Gender: Female Note Status: Finalized Procedure:            Colonoscopy Indications:          Screening in patient at increased risk: Family history                        of 1st-degree relative with colorectal cancer Providers:            Manya Silvas, MD Referring MD:         Dion Body (Referring MD) Medicines:            Propofol per Anesthesia Complications:        No immediate complications. Procedure:            Pre-Anesthesia Assessment:                       - After reviewing the risks and benefits, the patient                        was deemed in satisfactory condition to undergo the                        procedure.                       After obtaining informed consent, the colonoscope was                        passed under direct vision. Throughout the procedure,                        the patient's blood pressure, pulse, and oxygen                        saturations were monitored continuously. The                        Colonoscope was introduced through the anus and                        advanced to the the cecum, identified by appendiceal                        orifice and ileocecal valve. The colonoscopy was                        performed without difficulty. The patient tolerated the                        procedure well. The quality of the bowel preparation                        was good. Findings:      A small polyp was found in the cecum. The polyp was sessile. The polyp       was removed with a hot snare. Resection and retrieval were complete.      Two sessile polyps were found in the ascending colon. The  polyps were       small in size. These polyps were removed with a hot snare. Resection and       retrieval were  complete.      A diminutive polyp was found in the ascending colon. The polyp was       sessile. The polyp was removed with a jumbo cold forceps. Resection and       retrieval were complete.      A small polyp was found in the ascending colon. The polyp was sessile.       The polyp was removed with a hot snare. Resection and retrieval were       complete.      A diminutive polyp was found in the hepatic flexure. The polyp was       sessile. The polyp was removed with a jumbo cold forceps. Resection and       retrieval were complete.      Two sessile polyps were found in the transverse colon. The polyps were       small in size. These polyps were removed with a hot snare. Resection and       retrieval were complete.      A small polyp was found in the splenic flexure. The polyp was sessile.       The polyp was removed with a hot snare. Resection and retrieval were       complete.      A medium polyp was found in the splenic flexure. The polyp was sessile.       The polyp was removed with a hot snare. Resection and retrieval were       complete. To prevent bleeding after the polypectomy, three hemostatic       clips were successfully placed. There was no bleeding at the end of the       procedure.      Two sessile polyps were found in the transverse colon. The polyps were       small in size. These polyps were removed with a hot snare. Resection and       retrieval were complete.      A medium polyp was found in the descending colon. The polyp was sessile.       The polyp was removed with a hot snare. Resection and retrieval were       complete. To prevent bleeding after the polypectomy, one hemostatic clip       was successfully placed.      Seven sessile polyps were found in the rectum and sigmoid colon. The       polyps were small in size. These polyps were removed with a hot snare.       Resection and retrieval were complete.      Internal hemorrhoids were found during endoscopy. The  hemorrhoids were       small and Grade I (internal hemorrhoids that do not prolapse).      The exam was otherwise without abnormality. Impression:           - One small polyp in the cecum, removed with a hot                        snare. Resected and retrieved.                       - Two small polyps in the ascending  colon, removed with                        a hot snare. Resected and retrieved.                       - One diminutive polyp in the ascending colon, removed                        with a jumbo cold forceps. Resected and retrieved.                       - One small polyp in the ascending colon, removed with                        a hot snare. Resected and retrieved.                       - One diminutive polyp at the hepatic flexure, removed                        with a jumbo cold forceps. Resected and retrieved.                       - Two small polyps in the transverse colon, removed                        with a hot snare. Resected and retrieved.                       - One small polyp at the splenic flexure, removed with                        a hot snare. Resected and retrieved.                       - One medium polyp at the splenic flexure, removed with                        a hot snare. Resected and retrieved. Clips were placed.                       - Two small polyps in the transverse colon, removed                        with a hot snare. Resected and retrieved.                       - One medium polyp in the descending colon, removed                        with a hot snare. Resected and retrieved. Clip was                        placed.                       - Seven small polyps in the rectum and in the sigmoid  colon, removed with a hot snare. Resected and retrieved.                       - Internal hemorrhoids.                       - The examination was otherwise normal. Recommendation:       - Await pathology results. Manya Silvas,  MD 09/08/2017 11:33:43 AM This report has been signed electronically. Number of Addenda: 0 Note Initiated On: 09/08/2017 9:57 AM Scope Withdrawal Time: 0 hours 58 minutes 42 seconds  Total Procedure Duration: 1 hour 8 minutes 44 seconds       [facility]

## 2017-09-09 ENCOUNTER — Encounter: Payer: Self-pay | Admitting: Unknown Physician Specialty

## 2017-09-09 LAB — SURGICAL PATHOLOGY

## 2017-09-10 ENCOUNTER — Telehealth: Payer: Self-pay | Admitting: *Deleted

## 2017-09-10 DIAGNOSIS — Z87891 Personal history of nicotine dependence: Secondary | ICD-10-CM

## 2017-09-10 DIAGNOSIS — Z122 Encounter for screening for malignant neoplasm of respiratory organs: Secondary | ICD-10-CM

## 2017-09-10 NOTE — Telephone Encounter (Signed)
Received referral for initial lung cancer screening scan. Contacted patient and obtained smoking history,(current, 78 pack year) as well as answering questions related to screening process. Patient denies signs of lung cancer such as weight loss or hemoptysis. Patient denies comorbidity that would prevent curative treatment if lung cancer were found. Patient is scheduled forCT scan on 09/17/17.

## 2017-09-17 ENCOUNTER — Encounter: Payer: Self-pay | Admitting: Nurse Practitioner

## 2017-09-17 ENCOUNTER — Inpatient Hospital Stay: Payer: Medicare HMO | Attending: Nurse Practitioner | Admitting: Nurse Practitioner

## 2017-09-17 ENCOUNTER — Ambulatory Visit
Admission: RE | Admit: 2017-09-17 | Discharge: 2017-09-17 | Disposition: A | Discharge status: Home / Self Care | Payer: Medicare HMO | Source: Ambulatory Visit | Attending: Nurse Practitioner | Admitting: Nurse Practitioner

## 2017-09-17 DIAGNOSIS — Z122 Encounter for screening for malignant neoplasm of respiratory organs: Secondary | ICD-10-CM

## 2017-09-17 DIAGNOSIS — R918 Other nonspecific abnormal finding of lung field: Secondary | ICD-10-CM | POA: Insufficient documentation

## 2017-09-17 DIAGNOSIS — Z87891 Personal history of nicotine dependence: Secondary | ICD-10-CM | POA: Insufficient documentation

## 2017-09-17 DIAGNOSIS — J432 Centrilobular emphysema: Secondary | ICD-10-CM | POA: Diagnosis not present

## 2017-09-17 DIAGNOSIS — F17210 Nicotine dependence, cigarettes, uncomplicated: Secondary | ICD-10-CM | POA: Diagnosis not present

## 2017-09-17 DIAGNOSIS — I2510 Atherosclerotic heart disease of native coronary artery without angina pectoris: Secondary | ICD-10-CM | POA: Insufficient documentation

## 2017-09-17 DIAGNOSIS — I700 Atherosclerosis of aorta: Secondary | ICD-10-CM | POA: Diagnosis not present

## 2017-09-17 DIAGNOSIS — M47814 Spondylosis without myelopathy or radiculopathy, thoracic region: Secondary | ICD-10-CM | POA: Diagnosis not present

## 2017-09-18 DIAGNOSIS — Z87891 Personal history of nicotine dependence: Secondary | ICD-10-CM | POA: Insufficient documentation

## 2017-09-18 NOTE — Progress Notes (Signed)
In accordance with CMS guidelines, patient has met eligibility criteria including age, absence of signs or symptoms of lung cancer.  Social History  Substance Use Topics  . Smoking status: Current Every Day Smoker    Packs/day: 1.50    Years: 52.00  . Smokeless tobacco: Never Used     Comment: 75 pk yr - 1.5 ppd; 49 years; started age 66  . Alcohol use No     A shared decision-making session was conducted prior to the performance of CT scan. This includes one or more decision aids, includes benefits and harms of screening, follow-up diagnostic testing, over-diagnosis, false positive rate, and total radiation exposure.  Counseling on the importance of adherence to annual lung cancer LDCT screening, impact of co-morbidities, and ability or willingness to undergo diagnosis and treatment is imperative for compliance of the program.  Counseling on the importance of continued smoking cessation for former smokers; the importance of smoking cessation for current smokers, and information about tobacco cessation interventions have been given to patient including [place] and [facility] quit [place] programs.  Written order for lung cancer screening with LDCT has been given to the patient and any and all questions have been answered to the best of my abilities.   Yearly follow up will be coordinated by Burgess Estelle, Thoracic Navigator.  Beckey Rutter, DNP AGNP-C 09/17/17 9:13 AM

## 2017-09-19 ENCOUNTER — Telehealth: Payer: Self-pay | Admitting: *Deleted

## 2017-09-19 NOTE — Telephone Encounter (Signed)
Notified patient of LDCT lung cancer screening program results with recommendation for 3 month follow up imaging. Also notified of incidental findings noted below and is encouraged to discuss further with PCP who will receive a copy of this note and/or the CT report. Patient verbalizes understanding.   IMPRESSION: 1. Lung-RADS 4A, suspicious. Follow up low-dose chest CT without contrast in 3 months (please use the following order, "CT CHEST LCS NODULE FOLLOW-UP W/O CM") is recommended. Alternatively, PET may be considered when there is a solid component 21mm or larger. Left lower lobe pulmonary nodule of volume derived equivalent diameter 8.1 mm. 2. Coronary artery atherosclerosis. Aortic Atherosclerosis (ICD10-I70.0). 3.  Emphysema (ICD10-J43.9). These results will be called to the ordering clinician or representative by the Radiologist Assistant, and communication documented in the PACS or zVision Dashboard.

## 2017-10-15 DIAGNOSIS — I2510 Atherosclerotic heart disease of native coronary artery without angina pectoris: Secondary | ICD-10-CM | POA: Insufficient documentation

## 2017-11-17 ENCOUNTER — Ambulatory Visit: Payer: Medicare HMO | Admitting: Pulmonary Disease

## 2017-11-21 ENCOUNTER — Ambulatory Visit (INDEPENDENT_AMBULATORY_CARE_PROVIDER_SITE_OTHER): Payer: Medicare HMO | Admitting: Pulmonary Disease

## 2017-11-21 ENCOUNTER — Encounter: Payer: Self-pay | Admitting: Pulmonary Disease

## 2017-11-21 VITALS — BP 150/84 | HR 104 | Ht 61.0 in | Wt 240.0 lb

## 2017-11-21 DIAGNOSIS — F17200 Nicotine dependence, unspecified, uncomplicated: Secondary | ICD-10-CM

## 2017-11-21 DIAGNOSIS — J449 Chronic obstructive pulmonary disease, unspecified: Secondary | ICD-10-CM | POA: Diagnosis not present

## 2017-11-21 DIAGNOSIS — E668 Other obesity: Secondary | ICD-10-CM | POA: Diagnosis not present

## 2017-11-21 DIAGNOSIS — E669 Obesity, unspecified: Secondary | ICD-10-CM

## 2017-11-21 DIAGNOSIS — R911 Solitary pulmonary nodule: Secondary | ICD-10-CM

## 2017-11-21 NOTE — Progress Notes (Signed)
PULMONARY OFFICE FOLLOW UP  PROBLEMS: Smoker Chronic hypoxemic respiratory failure COPD  PT PROFILE: 66 y.o. F smoker hospitalized 11/08-11/10/17 for elective hysterectomy. Noted to be hypoxemic post operatively. Seen in consultation by Dr Stevenson Clinch. Discharged home on O2@ 2 LPM [place]  DATA: CXR 10/10/16: Hyperinflation and mild diffuse interstitial prominence CXR 12/20/16: pre-op cxr revealed partially loculated L pleural effusion CT chest 01/08/17: Partially loculated moderate left pleural effusion posterolateral leak. Airspace disease in the lingula and posterior left lower lobe.  CXR 01/15/17: reduction in L pleural effusion LDCT 09/17/17: Resolved left-sided pleural effusion. Left pleural thickening remains. Moderate centrilobular emphysema. Lingular and left lower lobe scarring or subsegmental atelectasis. A left lower lobe pulmonary nodule measures volume derived equivalent diameter 8.1 mm. Lung-RADS 4A, suspicious. Follow up low-dose chest CT without contrast in 3 months is recommended.   INTERVAL HISTORY: No major events  SUBJ: This is a routine reevaluation.  She continues to smoke approximately 1/2 pack cigarettes per day.  She has no new complaints.  She continues to have mild exertional dyspnea.  She remains on Anoro inhaler which she believes is beneficial.  She rarely uses albuterol.  She is no longer on oxygen therapy.Denies CP, fever, purulent sputum, hemoptysis, LE edema and calf tenderness. LDCT performed in 09/2017 with results as above  OBJ: Vitals:   11/21/17 0928 11/21/17 0934  BP:  (!) 150/84  Pulse:  (!) 104  SpO2:  95%  Weight: 108.9 kg (240 lb)   Height: 5\' 1"  (1.549 m)   2 LPM [place]  NAD HEENT WNL NO JVD noted Breath sounds are mildly diminished with few scattered rhonchi, no wheezes Reg, no M NABS, soft No LE edema  DATA: BMP Latest Ref Rng & Units 09/03/2017 01/15/2017 11/15/2016  Glucose 65 - 99 mg/dL - 82 139(H)  BUN 6 - 20 mg/dL - 9 18  Creatinine 0.44  - 1.00 mg/dL 0.56 0.54 0.50  Sodium 135 - 145 mmol/L - 141 137  Potassium 3.5 - 5.1 mmol/L - 3.5 3.8  Chloride 101 - 111 mmol/L - 101 94(L)  CO2 22 - 32 mmol/L - 30 37(H)  Calcium 8.9 - 10.3 mg/dL - 9.3 8.6(L)    CBC Latest Ref Rng & Units 01/15/2017 11/15/2016 11/14/2016  WBC 3.6 - 11.0 K/uL 10.1 13.0(H) 12.3(H)  Hemoglobin 12.0 - 16.0 g/dL 14.0 13.6 13.1  Hematocrit 35.0 - 47.0 % 40.9 39.8 38.4  Platelets 150 - 440 K/uL 337 196 168    CXR: NNF  IMPRESSION: 1) Recalcitrant smoker 2) COPD, severity of obstruction is unknown. 3) left pleural effusion - resolved 4) Newly discovered LLL nodule (suspicious)  PLAN: 1) Smoking cessation was again discussed in detail including the risks associated with continued smoking, the benefits of smoking cessation and strategies that might be helpful.  2) Cont Anoro inhaler - one inhalation daily.  3) Continue Albuterol inhaler as needed 4) PFTs ordered 5) repeat LDCT planned for January 2019 (followed by oncology) 6) follow-up with me in 2 months  Merton Border, MD PCCM service Mobile [phone] Pager [phone] 11/21/2017 2:28 PM

## 2017-11-21 NOTE — Patient Instructions (Signed)
We again discussed smoking cessation.  It is very important that you pursue this Continue Anoro inhaler Continue albuterol inhaler as needed Lung function tests (PFTs) have been ordered You will need a repeat lung cancer screening CT scan in January 2019 Follow-up with me in 2 months

## 2017-12-18 ENCOUNTER — Telehealth: Payer: Self-pay | Admitting: *Deleted

## 2017-12-18 ENCOUNTER — Other Ambulatory Visit: Payer: Self-pay | Admitting: Obstetrics and Gynecology

## 2017-12-18 DIAGNOSIS — Z87891 Personal history of nicotine dependence: Secondary | ICD-10-CM

## 2017-12-18 DIAGNOSIS — R918 Other nonspecific abnormal finding of lung field: Secondary | ICD-10-CM

## 2017-12-18 DIAGNOSIS — Z122 Encounter for screening for malignant neoplasm of respiratory organs: Secondary | ICD-10-CM

## 2017-12-18 DIAGNOSIS — G8929 Other chronic pain: Secondary | ICD-10-CM

## 2017-12-18 DIAGNOSIS — R109 Unspecified abdominal pain: Principal | ICD-10-CM

## 2017-12-18 NOTE — Telephone Encounter (Signed)
Notified patient that lung cancer screening low dose CT scan recommended follow up is due currently or will be in near future. Confirmed that patient is within the age range of 55-77, and asymptomatic, (no signs or symptoms of lung cancer). Patient denies illness that would prevent curative treatment for lung cancer if found. Verified smoking history, (current, 78 pack year). The shared decision making visit was done 09/17/17. Patient is agreeable for CT scan being scheduled.

## 2017-12-18 NOTE — Telephone Encounter (Signed)
Left message for patient to notify them that it is time to schedule low dose lung cancer screening CT recommended follow up imaging. Instructed patient to call back to verify information prior to the scan being scheduled.

## 2017-12-31 ENCOUNTER — Ambulatory Visit
Admission: RE | Admit: 2017-12-31 | Discharge: 2017-12-31 | Disposition: A | Discharge status: Home / Self Care | Payer: Medicare HMO | Source: Ambulatory Visit | Attending: Obstetrics and Gynecology | Admitting: Obstetrics and Gynecology

## 2017-12-31 DIAGNOSIS — R109 Unspecified abdominal pain: Secondary | ICD-10-CM | POA: Diagnosis present

## 2017-12-31 DIAGNOSIS — E279 Disorder of adrenal gland, unspecified: Secondary | ICD-10-CM | POA: Diagnosis not present

## 2017-12-31 DIAGNOSIS — I700 Atherosclerosis of aorta: Secondary | ICD-10-CM | POA: Diagnosis not present

## 2017-12-31 DIAGNOSIS — R911 Solitary pulmonary nodule: Secondary | ICD-10-CM | POA: Diagnosis not present

## 2017-12-31 DIAGNOSIS — Z9049 Acquired absence of other specified parts of digestive tract: Secondary | ICD-10-CM | POA: Diagnosis not present

## 2017-12-31 DIAGNOSIS — J439 Emphysema, unspecified: Secondary | ICD-10-CM | POA: Diagnosis not present

## 2017-12-31 DIAGNOSIS — R590 Localized enlarged lymph nodes: Secondary | ICD-10-CM | POA: Insufficient documentation

## 2017-12-31 DIAGNOSIS — Z122 Encounter for screening for malignant neoplasm of respiratory organs: Secondary | ICD-10-CM

## 2017-12-31 DIAGNOSIS — R918 Other nonspecific abnormal finding of lung field: Secondary | ICD-10-CM | POA: Diagnosis present

## 2017-12-31 DIAGNOSIS — G8929 Other chronic pain: Secondary | ICD-10-CM

## 2017-12-31 DIAGNOSIS — Z87891 Personal history of nicotine dependence: Secondary | ICD-10-CM

## 2017-12-31 LAB — POCT I-STAT CREATININE: CREATININE: 0.6 mg/dL (ref 0.44–1.00)

## 2017-12-31 MED ORDER — IOPAMIDOL (ISOVUE-300) INJECTION 61%
100.0000 mL | Freq: Once | INTRAVENOUS | Status: AC | PRN
  Administered 2017-12-31: 100 mL via INTRAVENOUS

## 2018-01-05 ENCOUNTER — Encounter: Payer: Self-pay | Admitting: *Deleted

## 2018-01-17 ENCOUNTER — Other Ambulatory Visit: Payer: Self-pay | Admitting: Pulmonary Disease

## 2018-01-22 ENCOUNTER — Ambulatory Visit: Payer: Medicare HMO

## 2018-01-30 ENCOUNTER — Ambulatory Visit: Payer: Medicare HMO | Admitting: Pulmonary Disease

## 2018-02-05 ENCOUNTER — Ambulatory Visit: Payer: Medicare HMO | Attending: Pulmonary Disease

## 2018-02-05 DIAGNOSIS — R911 Solitary pulmonary nodule: Secondary | ICD-10-CM | POA: Diagnosis not present

## 2018-02-05 DIAGNOSIS — J449 Chronic obstructive pulmonary disease, unspecified: Secondary | ICD-10-CM | POA: Diagnosis not present

## 2018-02-05 DIAGNOSIS — F17200 Nicotine dependence, unspecified, uncomplicated: Secondary | ICD-10-CM | POA: Insufficient documentation

## 2018-02-05 MED ORDER — ALBUTEROL SULFATE (2.5 MG/3ML) 0.083% IN NEBU
2.5000 mg | INHALATION_SOLUTION | Freq: Once | RESPIRATORY_TRACT | Status: AC
  Administered 2018-02-05: 2.5 mg via RESPIRATORY_TRACT
  Filled 2018-02-05: qty 3

## 2018-02-07 IMAGING — CT CT CHEST LCS NODULE FOLLOW-UP W/O CM
2 of 5 series · 15 of 40 positions shown, 18 images · non-contrast
Comparison: 09/17/2017

CLINICAL DATA: 66-year-old female with 50 pack-year history of
smoking. Lung cancer screening.

EXAM:
CT CHEST WITHOUT CONTRAST FOR LUNG CANCER SCREENING NODULE FOLLOW-UP
TECHNIQUE: Multidetector CT imaging of the chest was performed following the
standard protocol without IV contrast.

[Series 3: lcs f/u 1.00 hr68 s3 lung · axial · 0.75mm/px · z∈[-1374,-1086]mm · 12 of 322 slices shown, 15 images]
[im 17/322  mediastinal]
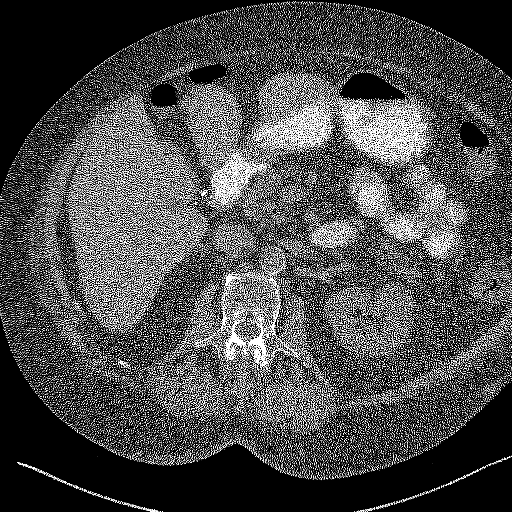
[im 17/322  lung]
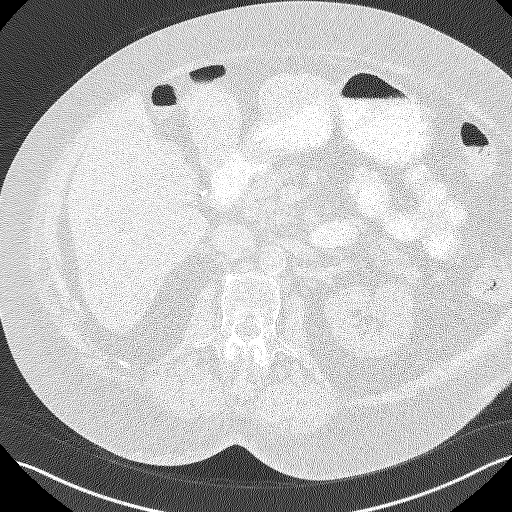
[im 49/322  lung]
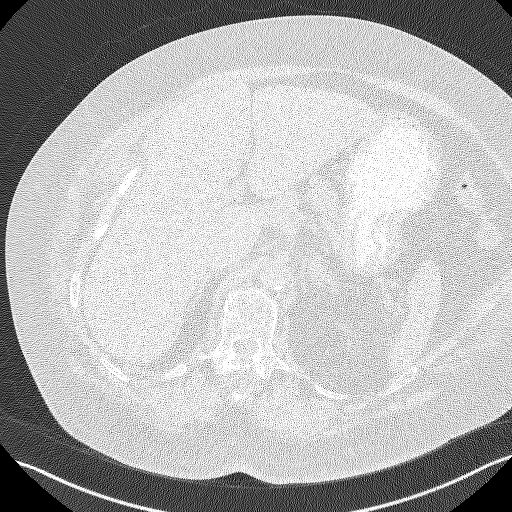
[im 65/322  lung]
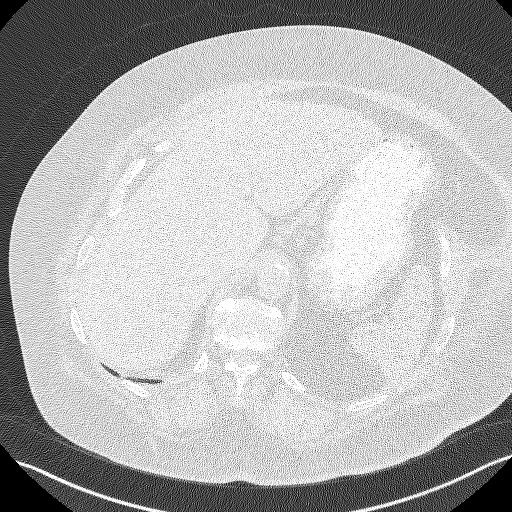
[im 97/322  lung]
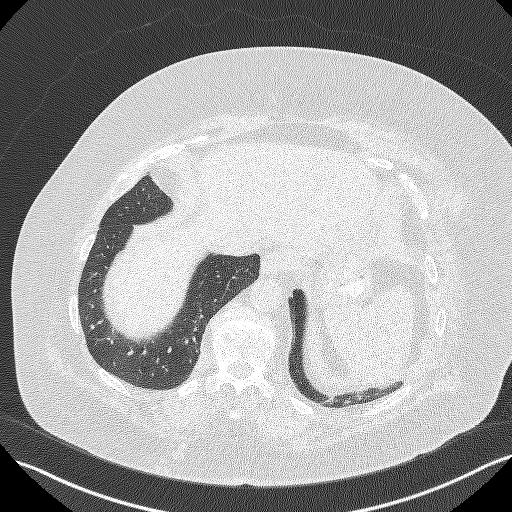
[im 129/322  mediastinal]
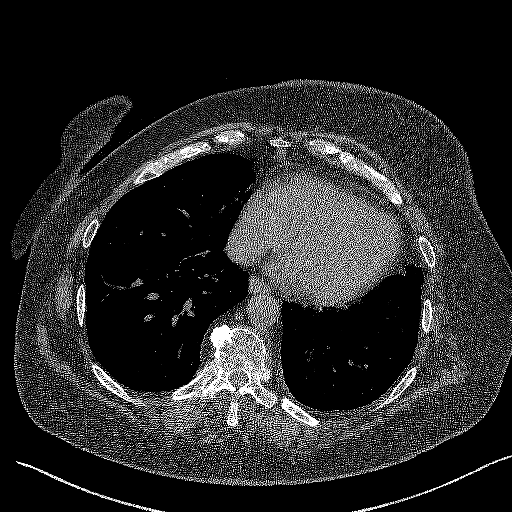
[im 129/322  lung]
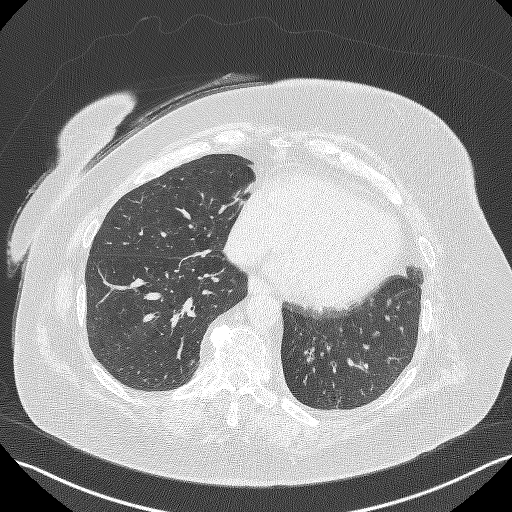
[im 145/322  lung]
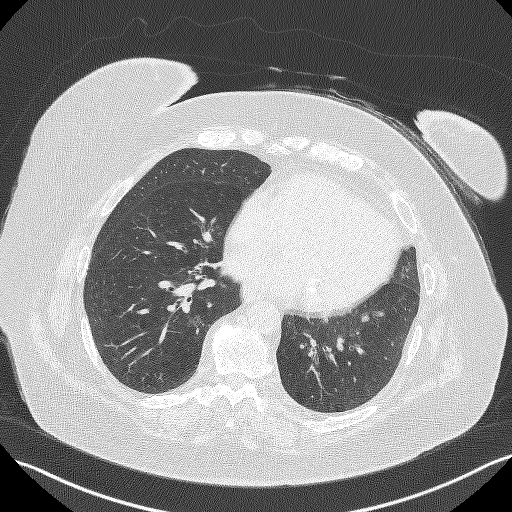
[im 177/322  lung]
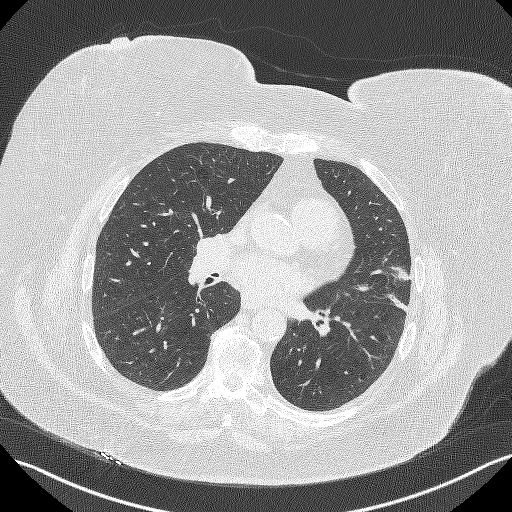
[im 193/322  lung]
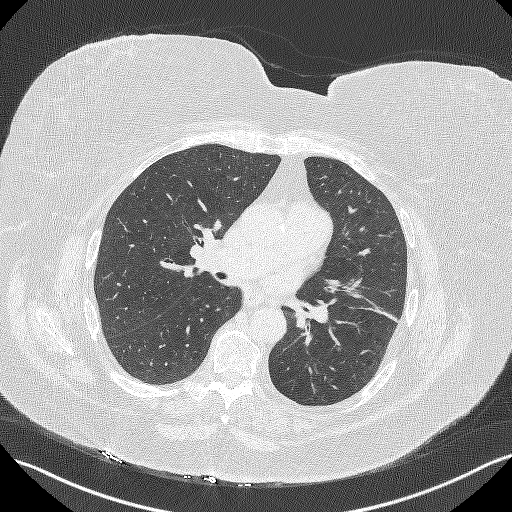
[im 225/322  mediastinal]
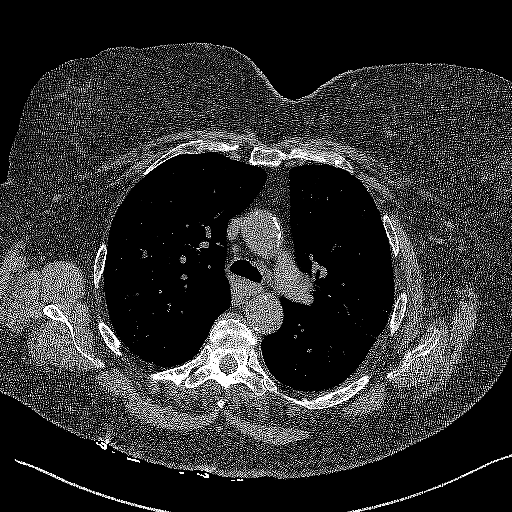
[im 225/322  lung]
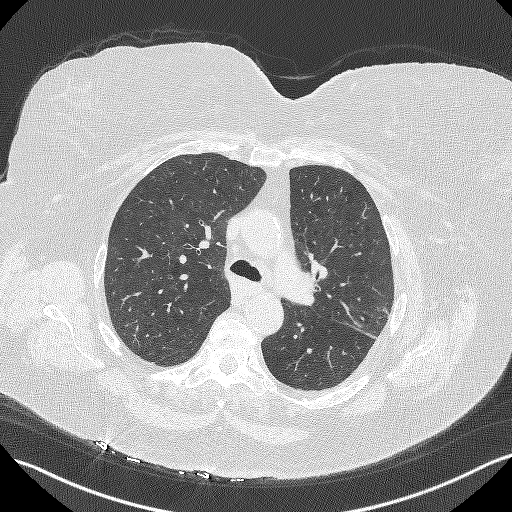
[im 257/322  lung]
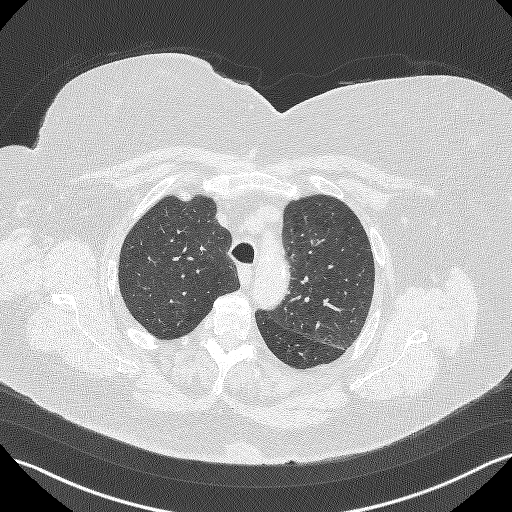
[im 273/322  lung]
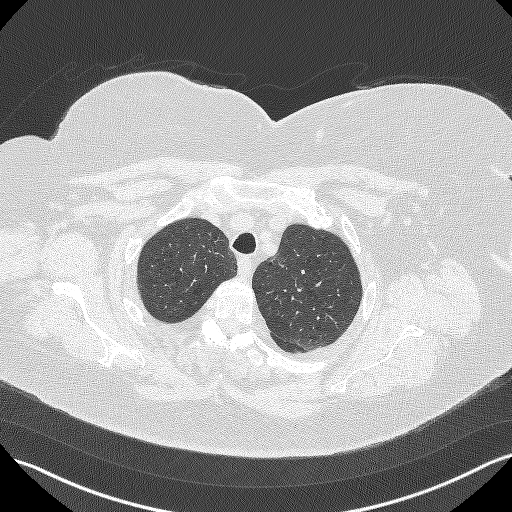
[im 305/322  lung]
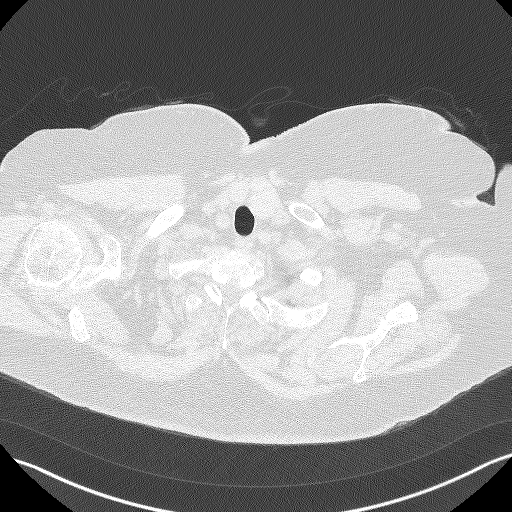

[Series 4: lcs f/u 1.00 br44 s3 cor · coronal · 0.64mm/px · 3 of 391 slices shown]
[im 79/391  lung]
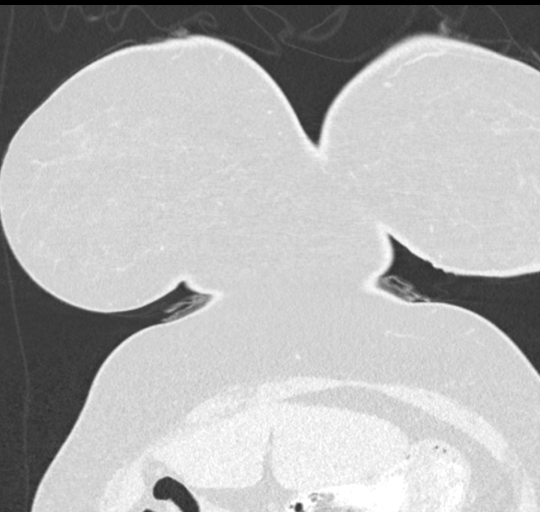
[im 157/391  lung]
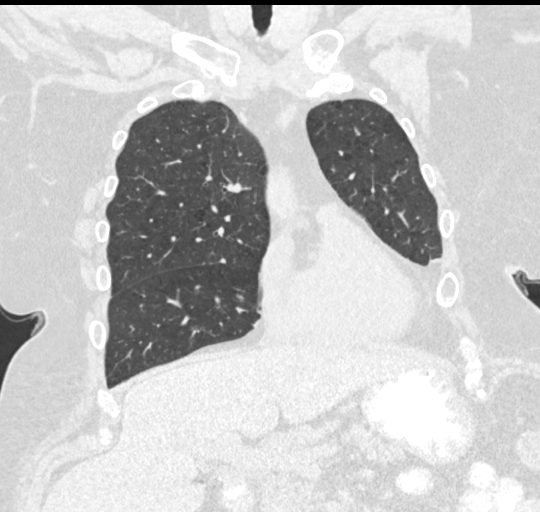
[im 235/391  lung]
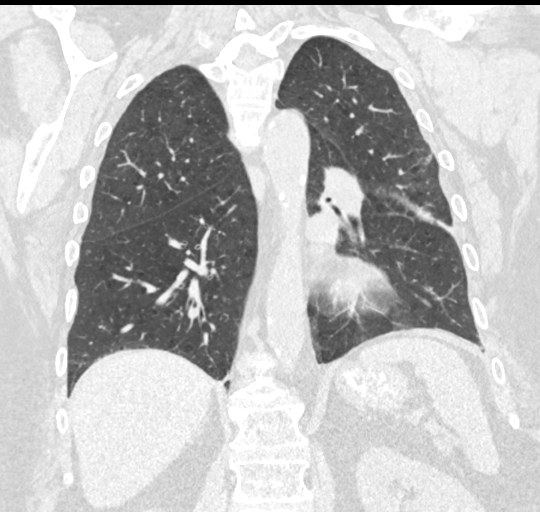

[15 of 40 positions shown; findings below may reference images not displayed]

FINDINGS: Cardiovascular: The heart size is normal. No pericardial effusion.
Atherosclerotic calcification is noted in the wall of the thoracic
aorta.

Mediastinum/Nodes: No mediastinal lymphadenopathy. No evidence for
gross hilar lymphadenopathy although assessment is limited by the
lack of intravenous contrast on today's study. The esophagus has
normal imaging features. There is no axillary lymphadenopathy.

Lungs/Pleura: Centrilobular emphysema noted. Scattered subtle
centrilobular ground-glass nodules noted bilaterally with an upper
lobe predominance, compatible with smoking related lung disease.
Interval development of bandlike opacity in the lateral subpleural
lingula, has progressed in the interval and is compatible with
atelectasis.

Posterior left lower lobe pulmonary nodule is stable by CT screening
criteria, measuring volume derived diameter 7.9 mm today compared
8.1 mm previously. No new suspicious pulmonary nodule or mass.

Upper Abdomen: Status post cholecystectomy. Otherwise visualized
upper abdomen unremarkable.

Musculoskeletal: Bone windows reveal no worrisome lytic or sclerotic
osseous lesions.
IMPRESSION: 1. Left lower lobe pulmonary nodule is stable by CT screening
criteria with volume derived equivalent diameter of 7.9 mm today.
Lung-RADS 2, benign appearance or behavior. Continue annual
screening with low-dose chest CT without contrast in 12 months.
2. Interval progression of atelectasis and/or scarring in the
peripheral lingula.
3.  Emphysema. (6MMP3-0PY.U)
4.  Aortic Atherosclerois (6MMP3-170.0)

## 2018-02-10 ENCOUNTER — Encounter: Payer: Self-pay | Admitting: Pulmonary Disease

## 2018-02-10 ENCOUNTER — Ambulatory Visit (INDEPENDENT_AMBULATORY_CARE_PROVIDER_SITE_OTHER): Payer: Medicare HMO | Admitting: Pulmonary Disease

## 2018-02-10 VITALS — BP 132/72 | HR 77 | Ht 61.0 in | Wt 236.0 lb

## 2018-02-10 DIAGNOSIS — F17200 Nicotine dependence, unspecified, uncomplicated: Secondary | ICD-10-CM

## 2018-02-10 DIAGNOSIS — R0609 Other forms of dyspnea: Secondary | ICD-10-CM | POA: Diagnosis not present

## 2018-02-10 DIAGNOSIS — J449 Chronic obstructive pulmonary disease, unspecified: Secondary | ICD-10-CM | POA: Diagnosis not present

## 2018-02-10 DIAGNOSIS — E668 Other obesity: Secondary | ICD-10-CM | POA: Diagnosis not present

## 2018-02-10 NOTE — Patient Instructions (Signed)
We discussed smoking cessation in detail.  You are to try nicotine lozenges (2 mg strength) as an alternative to smoking cigarettes. Continue Anoro inhaler and albuterol inhaler as needed Follow-up in 4-6 months or sooner as needed

## 2018-02-11 NOTE — Progress Notes (Signed)
PULMONARY OFFICE FOLLOW UP  PROBLEMS: Smoker Chronic hypoxemic respiratory failure COPD  PT PROFILE: 67 y.o. F smoker hospitalized 11/08-11/10/17 for elective hysterectomy. Noted to be hypoxemic post operatively. Seen in consultation by Dr Stevenson Clinch. Discharged home on O2@ 2 LPM [place]  DATA: CXR 10/10/16: Hyperinflation and mild diffuse interstitial prominence CXR 12/20/16: pre-op cxr revealed partially loculated L pleural effusion CT chest 01/08/17: Partially loculated moderate left pleural effusion posterolateral leak. Airspace disease in the lingula and posterior left lower lobe.  CXR 01/15/17: reduction in L pleural effusion LDCT 09/17/17: Resolved left-sided pleural effusion. Left pleural thickening remains. Moderate centrilobular emphysema. Lingular and left lower lobe scarring or subsegmental atelectasis. A left lower lobe pulmonary nodule measures volume derived equivalent diameter 8.1 mm. Lung-RADS 4A, suspicious. Follow up low-dose chest CT without contrast in 3 months is recommended.  PFTs 02/05/18: FVC: 1.968 or than sign 2.23 L (77 >87 %pred), FEV1: 1.03 > 1.12 L (50 > 54 %pred), FEV1/FVC: 53%, TLC: 3.90 L (92 %pred), DLCO 9.9 (41 %pred), DL/VA 87% predicted    INTERVAL HISTORY: No major events  SUBJ: This is a routine reevaluation.  She has been unable to quit smoking on her own.  She continues to smoke approximately 6-8 cigarettes per day. She continues to have mild exertional dyspnea.  She remains on Anoro inhaler and continues to believe that it is beneficial.  She rarely uses albuterol. She has no new complaints. Denies CP, fever, purulent sputum, hemoptysis, LE edema and calf tenderness. LDCT performed in 09/2017 with results as above  OBJ: Vitals:   02/10/18 1045 02/10/18 1052  BP:  132/72  Pulse:  77  SpO2:  95%  Weight: 107 kg (236 lb)   Height: 5\' 1"  (1.549 m)   RA   Gen: NAD HEENT: NCAT, sclerae white, oropharynx normal Neck: No LAN, no JVD noted Lungs:  Mildly diminished breath sounds without wheezes or other adventitious sounds Cardiovascular: RRR, no M Abdomen: Soft, NT, +BS Ext: no C/C/E Neuro: No focal deficits on limited exam   DATA: BMP Latest Ref Rng & Units 12/31/2017 09/03/2017 01/15/2017  Glucose 65 - 99 mg/dL - - 82  BUN 6 - 20 mg/dL - - 9  Creatinine 0.44 - 1.00 mg/dL 0.60 0.56 0.54  Sodium 135 - 145 mmol/L - - 141  Potassium 3.5 - 5.1 mmol/L - - 3.5  Chloride 101 - 111 mmol/L - - 101  CO2 22 - 32 mmol/L - - 30  Calcium 8.9 - 10.3 mg/dL - - 9.3    CBC Latest Ref Rng & Units 01/15/2017 11/15/2016 11/14/2016  WBC 3.6 - 11.0 K/uL 10.1 13.0(H) 12.3(H)  Hemoglobin 12.0 - 16.0 g/dL 14.0 13.6 13.1  Hematocrit 35.0 - 47.0 % 40.9 39.8 38.4  Platelets 150 - 440 K/uL 337 196 168    CXR: No recent film  IMPRESSION: 1) Recalcitrant smoker 2) moderate COPD 3) moderate obesity 4) moderate exertional dyspnea, mostly attributable to COPD with possible component of obesity and deconditioning  Most of the visit today was spent discussing the importance of smoking cessation and strategies to achieve this.  She can go extended periods of time without smoking but there are other times where she feels a strong urge to smoke.  We discussed possibly using Chantix therapy which she wishes to avoid at this time.  Therefore, we discussed nicotine replacement therapy.  She has tried nicotine gum in the past which she did not like.  Therefore I recommended nicotine lozenges as discussed below.  PLAN: 1) continue Anoro inhaler and albuterol as needed 2) I recommended nicotine lozenges (2 mg strength)-8-10 lozenges/day as an alternative to cigarettes 3) follow-up in 4-6 months or sooner as needed  Merton Border, MD PCCM service Mobile [phone] Pager [phone] 02/11/2018 1:24 PM

## 2018-03-04 ENCOUNTER — Ambulatory Visit: Payer: Medicare HMO

## 2018-03-11 ENCOUNTER — Inpatient Hospital Stay: Payer: Medicare HMO | Attending: Obstetrics and Gynecology | Admitting: Obstetrics and Gynecology

## 2018-03-11 VITALS — BP 136/64 | HR 74 | Temp 97.0°F | Resp 18 | Ht 61.0 in | Wt 233.8 lb

## 2018-03-11 DIAGNOSIS — Z90710 Acquired absence of both cervix and uterus: Secondary | ICD-10-CM | POA: Diagnosis not present

## 2018-03-11 DIAGNOSIS — Z90722 Acquired absence of ovaries, bilateral: Secondary | ICD-10-CM | POA: Insufficient documentation

## 2018-03-11 DIAGNOSIS — C541 Malignant neoplasm of endometrium: Secondary | ICD-10-CM | POA: Diagnosis not present

## 2018-03-11 DIAGNOSIS — Z8542 Personal history of malignant neoplasm of other parts of uterus: Secondary | ICD-10-CM | POA: Insufficient documentation

## 2018-03-11 NOTE — Progress Notes (Signed)
Patient c/o abdominal pain with bloating/ distention and decrease appetite

## 2018-03-11 NOTE — Progress Notes (Signed)
Referral sent to [facility] for genetic counseling. Oncology Nurse Navigator Documentation      )Navigator Encounter Type: Follow-up Appt (03/11/18 1400)                                                    Time Spent with Patient: 15 (03/11/18 1400)

## 2018-03-11 NOTE — Progress Notes (Signed)
Gynecologic Oncology Interval Visit   Referring Provider: Boykin Nearing, MD [address] [facility] [place], [place] [address] [phone]  Chief Concern: surveillance visit for endometrial cancer  Subjective:  Jessica Dalton is a 67 y.o. female who is seen in consultation from Dr. Ouida Sills for grade 1 endometrial cancer.  She was last seen in clinic on 09/03/2017 and had a negative exam. She was started on Nystatin for dermatologic candidiasis which as improved.   09/08/2017 colonoscopy - multiple polyps with tubular adenoma, sessile serrated adenomas and hyperplastic polyps. Hiatal hernia, gastritis, esophagitis.  09/08/2017 endoscopy - esophagitis and gastritis  GI recommended repeat colonoscopy in 2010 and genetic testing which she declined.  Per patient she was treated for helicobacter but has not had follow up. Per GI note she does not meet criteria for IBS.   Low Dose CT Lung Cancer Screening Follow-up done on 12/31/2017 - Left lower lobe pulmonary nodule is stable with volume derived diameter of 7.62mm. Lung-Rads 2. Repeat in one year. Also noted: interval progression of atelectasis and/or scarring of the peripheral lingula, emphysema, and aortic atherosclerosis.   CT C/A/P 12/31/2017  IMPRESSION: 1. Stable exam.  No new or progressive interval findings. 2. Small left lower lobe pulmonary nodule unchanged in the 1.5 year interval since prior study consistent with benign etiology. 3. No change 15 mm porta hepatis lymph node identified on prior study. 4. Stable 14 mm left adrenal nodule, likely adenoma. 5.  Aortic Atherosclerois (ICD10-170.0)  IMPRESSION from CT Chest LCS 1. Left lower lobe pulmonary nodule is stable by CT screening criteria with volume derived equivalent diameter of 7.9 mm today. Lung-RADS 2, benign appearance or behavior. Continue annual screening with low-dose chest CT without contrast in 12 months. 2. Interval progression  of atelectasis and/or scarring in the peripheral lingula. 3.  Emphysema. (ICD10-J43.9) 4.  Aortic Atherosclerois (ICD10-170.0)  She has multiple complaints including abdominal pain (chronic), bloating/distensions/ decreased appetite, alternating diarrhea/constipation. She is somewhat upset about having to repeat a pelvic exam since she recently saw Dr. Ouida Sills on 12/18/2017. She was agreeable to [facility].   Gynecologic Oncology History She was referred to Dr. Ouida Sills by Dr. Netty Starring for PMB.  grade 1 endometrial cancer. EMBx and Pap performed on 09/09/2016. Uterus sounded to 10 cm. 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 Pap NILM.   Dr. Ouida Sills also ordered a CT scan to evaluate upper abdominal pain symptoms.    On 10/16/2016 she underwent RA-TLH BSO and SLN injection, mapping and biopsy. Her intraoperative and postoperative course was significant for pulmonary issues. She had desaturation due to chronic pulmonary disease from smoking.   09/24/2016 preoperative CT scan A/P revealed the following: IMPRESSION: 1. Single borderline enlarged portal hepatic lymph node. No additional findings suspicious for or suggestive of metastatic disease. Further imaging with PET-CT may be helpful to assess for hyper metabolism within this lymph node. 2. Left adrenal nodule, indeterminate. This could be better assessed with adrenal protocol MRI or PET-CT. 3. Thickening of the endometrium which in a postmenopausal female is considered abnormal and consistent with the clinical history of endometrial sarcoma. Stones identified within the gallbladder are noted measuring up to 3 cm.   I reviewed the findings preoperatively and spoke with Dr. Bary Castilla to see if he could be available for cholecystectomy as she was symptomatic with the gallstones. Unfortunately he was not available but recommended postoperative referral.  Preop CXR:  IMPRESSION: Hyperinflation and mild diffuse  interstitial prominence consistent with the history  of reactive airway disease. There is no pneumonia, CHF, nor other acute cardiopulmonary abnormality.  Aortic atherosclerosis.  Pathology noted as below:  Cytology: Negative  DIAGNOSIS:  A. SENTINEL LYMPH NODE, LEFT; EXCISION:  - NO TUMOR SEEN IN ONE LYMPH NODE (0/1).   B. SENTINEL LYMPH NODE, LEFT; EXCISION:  - NO TUMOR SEEN IN TWO LYMPH NODES (0/2).   C. UTERUS WITH CERVIX, BILATERAL FALLOPIAN TUBES AND OVARIES;  HYSTERECTOMY WITH BILATERAL SALPINGO-OOPHORECTOMY:  - ENDOMETRIOID ADENOCARCINOMA FIGO I.  - SURFACE DEGENERATIVE PAPILLARY FEATURES.  - MYOMETRIAL INVASION IS NOT IDENTIFIED.  - CHRONIC CERVICITIS.  - STROMAL HYPERPLASIA OF BILATERAL OVARIES.  - NO PATHOLOGIC CHANGE, BILATERAL FALLOPIAN TUBES.  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    ENDOMETRIUM: Hysterectomy, With or Without Other Organs or Tissues  Specimens InvolvedA: Sentinel lymph node, left  B: Sentinel lymph node, right proximal obturator  C: Uterus with cervix, bilateral tubes and ovaries   Endometrium, Hysterectomy, With or Without Other Organs or Tissues  Cancer Case Summary  Specimen: Uterine corpus  SPECIMEN  Procedure  Simple hysterectomy  Additional Procedures:  Bilateral salpingo-oophorectomy  Lymph Node Sampling:   Performed  Pelvic lymph nodes  Specimen Integrity: Intact hysterectomy specimen  TUMOR  Histologic Type:  Endometrioid adenocarcinoma, not otherwise  characterized  Histologic Grade:  FIGO grade 1  EXTENT  Tumor Size:  Greatest dimension (cm)  4.2cm  Myometrial Invasion:   Not identified  Involvement of Cervix:  Not involved  Other Organs Submitted: Right ovary  Not involved  Left ovary  Not involved  Right fallopian tube  Not involved  Left fallopian tube  Not involved  ACCESSORY FINDINGS  Lymph-Vascular Invasion: Not identified  STAGE (pTNM [FIGO])  Primary  Tumor (pT):  pT1a [IA]: Tumor limited to endometrium or invades less than one-half  of the myometrium  Regional Lymph Nodes (pN)  pN0: No regional lymph node metastasis  Pelvic Lymph Nodes: Number of Lymph Nodes Examined:  Specify  3  Number of Lymph Nodes Involved:  Specify  0  Para-aortic Lymph Nodes: No para-aortic nodes submitted or found  Distant Metastasis (pM): Not applicable   Since surgery she was seen by Dr. Bary Castilla on 11/06/2016 with the plan to proceed with Laparoscopic Cholecystectomy with Intraoperative Cholangiogram. She had a consult with Dr. Rogue Bussing for erythocytosis - work up still ongoing. She was admitted on 11/11/2016 throught 11/15/2016 with sepsis, pneumonia, COPD exacerbation,  dyspnea, and new onset atrial fibrillation. She is being treated by Dr. Netty Starring and Dr. Clayborn Bigness.  Her cholecystectomy was canceled due to her other comorbidities.   In 2/18 Dr Bary Castilla performed LS cholecystectomy without incident.    She has been NED since her endometrial cancer surgery.   Problem List: Patient Active Problem List   Diagnosis Date Noted  . Personal history of tobacco use, presenting hazards to health 09/18/2017  . History of endometrial cancer 09/02/2017  . Pleural effusion 01/14/2017  . Constipation 12/04/2016  . Atrial fibrillation with RVR ([place]) 11/11/2016  . Erythrocytosis 11/08/2016  . Cigarette smoker   . Gallstones 10/16/2016  . Endometrial cancer determined by uterine biopsy ([place]) 10/16/2016  . Endometrial cancer ([place]) 09/18/2016    Past Medical History: Past Medical History:  Diagnosis Date  . A-fib ([place])   . Anemia   . Asthma   . Cancer ([place])    uterine ca  . Chronic cough   . COPD (chronic obstructive pulmonary disease) ([place])   . Endometrial  cancer ([place])   . Gallstones 10/16/2016  . Hypoxemia   . Obesity   . Oxygen dependent   . Pneumonia   . Sleep apnea   . Tachycardia   . Tobacco use     Past Surgical History: Past Surgical  History:  Procedure Laterality Date  . ABDOMINAL HYSTERECTOMY    . CATARACT EXTRACTION, BILATERAL  2015  . CHOLECYSTECTOMY N/A 01/23/2017   Procedure: LAPAROSCOPIC CHOLECYSTECTOMY WITH INTRAOPERATIVE CHOLANGIOGRAM;  Surgeon: Robert Bellow, MD;  Location: [facility] ORS;  Service: General;  Laterality: N/A;  . COLONOSCOPY WITH PROPOFOL N/A 09/08/2017   Procedure: COLONOSCOPY WITH PROPOFOL;  Surgeon: Manya Silvas, MD;  Location: [facility] ENDOSCOPY;  Service: Endoscopy;  Laterality: N/A;  . ESOPHAGOGASTRODUODENOSCOPY (EGD) WITH PROPOFOL N/A 09/08/2017   Procedure: ESOPHAGOGASTRODUODENOSCOPY (EGD) WITH PROPOFOL;  Surgeon: Manya Silvas, MD;  Location: [facility] ENDOSCOPY;  Service: Endoscopy;  Laterality: N/A;  . PELVIC LYMPH NODE DISSECTION N/A 10/16/2016   Procedure: PELVIC LYMPH NODE DISSECTION;  Surgeon: Gillis Ends, MD;  Location: [facility] ORS;  Service: Gynecology;  Laterality: N/A;  . ROBOTIC ASSISTED TOTAL HYSTERECTOMY WITH BILATERAL SALPINGO OOPHERECTOMY Bilateral 10/16/2016   Procedure: ROBOTIC ASSISTED TOTAL HYSTERECTOMY WITH BILATERAL SALPINGO OOPHORECTOMY;  Surgeon: Gillis Ends, MD;  Location: [facility] ORS;  Service: Gynecology;  Laterality: Bilateral;  . SENTINEL NODE BIOPSY N/A 10/16/2016   Procedure: SENTINEL NODE INJECTION;  Surgeon: Gillis Ends, MD;  Location: [facility] ORS;  Service: Gynecology;  Laterality: N/A;  . TUBAL LIGATION      Past Gynecologic History:  Menarche: unknown Menstrual details: postmenopausal Last Menstrual Period: 15 years ago History of OCP/HRT use: None History of Abnormal pap: no, benign cellular changes Last pap: as per HPI   OB History:  OB History  Gravida Para Term Preterm AB Living  3 3 3     3   SAB TAB Ectopic Multiple Live Births               # Outcome Date GA Lbr Len/2nd Weight Sex Delivery Anes PTL Lv  3 Term           2 Term           1 Term             Obstetric Comments  1st Menstrual Cycle:  ?   1st Pregnancy:   19      Family History: Family History  Problem Relation Age of Onset  . Cancer Mother        renal cell cancer early 32's  . Cancer Father        colon cancer late 46's   . Cancer Sister        melanoma  age 23; cervical vs endometrial cancer age?    Social History: Social History   Socioeconomic History  . Marital status: Married    Spouse name: Not on file  . Number of children: Not on file  . Years of education: Not on file  . Highest education level: Not on file  Occupational History  . Not on file  Social Needs  . Financial resource strain: Not on file  . Food insecurity:    Worry: Not on file    Inability: Not on file  . Transportation needs:    Medical: Not on file    Non-medical: Not on file  Tobacco Use  . Smoking status: Current Every Day Smoker    Packs/day: 0.50    Years: 52.00    Pack  years: 26.00  . Smokeless tobacco: Never Used  . Tobacco comment: 63 pk yr - 1.5 ppd; 57 years; started age 48  Substance and Sexual Activity  . Alcohol use: No  . Drug use: No  . Sexual activity: Not on file  Lifestyle  . Physical activity:    Days per week: Not on file    Minutes per session: Not on file  . Stress: Not on file  Relationships  . Social connections:    Talks on phone: Not on file    Gets together: Not on file    Attends religious service: Not on file    Active member of club or organization: Not on file    Attends meetings of clubs or organizations: Not on file    Relationship status: Not on file  . Intimate partner violence:    Fear of current or ex partner: Not on file    Emotionally abused: Not on file    Physically abused: Not on file    Forced sexual activity: Not on file  Other Topics Concern  . Not on file  Social History Narrative  . Not on file    Allergies: Allergies  Allergen Reactions  . Bismuth Subsalicylate Nausea And Vomiting    Current Medications: Current Outpatient Medications  Medication Sig Dispense Refill   . albuterol (PROVENTIL HFA;VENTOLIN HFA) 108 (90 Base) MCG/ACT inhaler Inhale 2 puffs into the lungs every 6 (six) hours as needed for wheezing or shortness of breath. 1 Inhaler 1  . ANORO ELLIPTA 62.5-25 MCG/INH AEPB INHALE ONE PUFF BY MOUTH ONCE DAILY 60 each 5  . apixaban (ELIQUIS) 5 MG TABS tablet Take 5 mg by mouth 2 (two) times daily.    [name] aspirin EC 81 MG EC tablet Take 1 tablet (81 mg total) by mouth daily.    . Calcium Carbonate-Vitamin D (OYSTER SHELL CALCIUM/D) 500-400 MG-UNIT TABS Take 1 tablet by mouth daily.     [name] diltiazem (CARDIZEM CD) 120 MG 24 hr capsule Take 1 capsule (120 mg total) by mouth daily. 30 capsule 0  . docusate sodium (COLACE) 100 MG capsule Take 1 capsule (100 mg total) by mouth daily as needed for mild constipation. (Patient taking differently: Take 100 mg by mouth at bedtime. ) 60 capsule 3  . furosemide (LASIX) 20 MG tablet Take 1 tablet (20 mg total) by mouth 2 (two) times daily. (Patient taking differently: Take 20 mg by mouth daily as needed (for fluid retention). ) 60 tablet 0  . linaclotide (LINZESS) 72 MCG capsule Take 72 mcg by mouth daily before breakfast.    . nystatin (MYCOSTATIN/NYSTOP) powder Apply topically 3 (three) times daily. 60 g 0  . acetaminophen (TYLENOL) 500 MG tablet Take 1,500 mg by mouth daily as needed for mild pain.    [name] ibuprofen (ADVIL,MOTRIN) 200 MG tablet Take 400 mg by mouth every 6 (six) hours as needed for headache or moderate pain.    . polyethylene glycol (MIRALAX / GLYCOLAX) packet Take 17 g by mouth at bedtime.      No current facility-administered medications for this visit.     Review of Systems General: fatigue  HEENT: no complaints  Lungs: as per history  Cardiac: a-fib resolved  GI: as per history  GU: no bleeding  Musculoskeletal: h/o back pain, no complaints today  Extremities: no complaints  Skin: no complaints  Neuro: no complaints  Endocrine: no complaints  Psych: no complaints       Objective:  Physical Examination:  BP 136/64 (BP Location: Left Arm, Patient Position: Sitting)   Pulse 74   Temp (!) 97 F (36.1 C) (Tympanic)   Resp 18   Ht 5\' 1"  (1.549 m)   Wt 233 lb 12.8 oz (106.1 kg)   BMI 44.18 kg/m    ECOG Performance Status: 0 - Asymptomatic  General appearance: alert, cooperative and appears stated age HEENT:PERRLA, extra ocular movement intact and sclera clear, anicteric. CV: RRR Lungs: Wheezing on expiration bilaterally upper lobes; BS otherwise normal and good air movement.  Abdomen: soft, obese, nontender to palpation, distension difficult to determine given habitus, nontympanic, no hepatosplenomegaly, no masses or organomegaly and no hernias.  Back: inspection of back is normal Extremities: extremities normal, atraumatic, no cyanosis or edema  Skin: all incisions are well healed. Erythema under pannus improved compared to last exam. Worst area in LLQ.  Neurological exam reveals alert, oriented, normal speech, no focal findings or movement disorder noted.  Pelvic: exam chaperoned by nurse;  Vulva: normal appearing vulva with no masses, tenderness or lesions, but erythematous; Vagina: normal vagina to palpation, cuff well healed, no masses. Uterus/cervix surgically absent.  Recto/vaginal: deferred    Assessment:  Jessica Dalton is a 67 y.o. female with stage 1A, grade 1 endometrioid endometrial cancer.  Abdominal symptoms concerning may be GI related but differential also includes recurrence. Last CT scan reassuring and no evidence of malignancy. GI mentioned pain could be neuropathic in origin and recommended follow up with PCP. Symptoms are concerning enough more imaging needed. .   Preoperative imaging revealed single borderline enlarged portal hepatic lymph node and left adrenal nodule, stable findings on repeat CT scan.   History of pneumonia with abnormal CT scan in the setting of active tobacco use, stable pulmonary nodule.   Vulvitis and skin fold  dermatitis, improved.    Medical co-morbidities complicating care: asthma, tobacco use, and obesity. Additional issues include recent hospitalization for sepsis, pneumonia, COPD exacerbation,  dyspnea, and atrial fibrillation, now resolved. Symptomatic cholelithiasis. Constipation.  Plan:   Problem List Items Addressed This Visit      Genitourinary   Endometrial cancer ([place]) - Primary   Relevant Orders   NM PET Image Restag (PS) Skull Base To Thigh       With regard to endometrial cancer she has an excellent prognosis with early stage disease. Her risk of recurrence is less than 10%. I have recommended continued close follow up with exams, including pelvic exams every 6 months for 3-5 years and then annually thereafter.  Follow up with Dr. Ouida Sills every 6 months and [place] in one year.   Continue to follow up CT lung cancer screening with Burgess Estelle, RN. She is willing to see him about smoking cessation.  Continue nystatin for dermatitis.    Discussed genetic testing and counseling. She was diagnosed with endometrial cancer before we were doing universal screening for [place] screening as well as h/o SSE and hyperplastic polyps as well as family history of cancers (especially colon cancer in her father). She agrees with genetic counseling today and we will arrange for referral.   We recommended that she follow up with the GI team. We did provide information regarding IBS to review and see if her symptoms are similar to this diagnosis. She can also follow up with her PCP.   The patient's diagnosis, an outline of the further diagnostic and laboratory studies which will be required, the recommendation, and alternatives were discussed.  All questions were answered to  the patient's satisfaction    I personally reviewed the patient's history, completed key elements of her exam (abdominal, inguinal nodes, pelvic exam), and was involved in decision making in conjunction with Ms. Allen. I  finalized recommendations and plan of care.   Gillis Ends, MD  CC:  Boykin Nearing, MD [address] [facility] [place], [place] [address] [phone]  Dr. Dion Body

## 2018-03-11 NOTE — Patient Instructions (Addendum)
Dr. Ouida Sills [phone]. Call and make appointment for 6 months.  Irritable Bowel Syndrome, Adult Irritable bowel syndrome (IBS) is not one specific disease. It is a group of symptoms that affects the organs responsible for digestion (gastrointestinal or GI tract). To regulate how your GI tract works, your body sends signals back and forth between your intestines and your brain. If you have IBS, there may be a problem with these signals. As a result, your GI tract does not function normally. Your intestines may become more sensitive and overreact to certain things. This is especially true when you eat certain foods or when you are under stress. There are four types of IBS. These may be determined based on the consistency of your stool:  IBS with diarrhea.  IBS with constipation.  Mixed IBS.  Unsubtyped IBS.  It is important to know which type of IBS you have. Some treatments are more likely to be helpful for certain types of IBS. What are the causes? The exact cause of IBS is not known. What increases the risk? You may have a higher risk of IBS if:  You are a woman.  You are younger than 68 years old.  You have a family history of IBS.  You have mental health problems.  You have had bacterial infection of your GI tract.  What are the signs or symptoms? Symptoms of IBS vary from person to person. The main symptom is abdominal pain or discomfort. Additional symptoms usually include one or more of the following:  Diarrhea, constipation, or both.  Abdominal swelling or bloating.  Feeling full or sick after eating a small or regular-size meal.  Frequent gas.  Mucus in the stool.  A feeling of having more stool left after a bowel movement.  Symptoms tend to come and go. They may be associated with stress, psychiatric conditions, or nothing at all. How is this diagnosed? There is no specific test to diagnose IBS. Your health care provider will make a diagnosis based  on a physical exam, medical history, and your symptoms. You may have other tests to rule out other conditions that may be causing your symptoms. These may include:  Blood tests.  X-rays.  CT scan.  Endoscopy and colonoscopy. This is a test in which your GI tract is viewed with a long, thin, flexible tube.  How is this treated? There is no cure for IBS, but treatment can help relieve symptoms. IBS treatment often includes:  Changes to your diet, such as: ? Eating more fiber. ? Avoiding foods that cause symptoms. ? Drinking more water. ? Eating regular, medium-sized portioned meals.  Medicines. These may include: ? Fiber supplements if you have constipation. ? Medicine to control diarrhea (antidiarrheal medicines). ? Medicine to help control muscle spasms in your GI tract (antispasmodic medicines). ? Medicines to help with any mental health issues, such as antidepressants or tranquilizers.  Therapy. ? Talk therapy may help with anxiety, depression, or other mental health issues that can make IBS symptoms worse.  Stress reduction. ? Managing your stress can help keep symptoms under control.  Follow these instructions at home:  Take medicines only as directed by your health care provider.  Eat a healthy diet. ? Avoid foods and drinks with added sugar. ? Include more whole grains, fruits, and vegetables gradually into your diet. This may be especially helpful if you have IBS with constipation. ? Avoid any foods and drinks that make your symptoms worse. These may include dairy products and caffeinated  or carbonated drinks. ? Do not eat large meals. ? Drink enough fluid to keep your urine clear or pale yellow.  Exercise regularly. Ask your health care provider for recommendations of good activities for you.  Keep all follow-up visits as directed by your health care provider. This is important. Contact a health care provider if:  You have constant pain.  You have trouble or  pain with swallowing.  You have worsening diarrhea. Get help right away if:  You have severe and worsening abdominal pain.  You have diarrhea and: ? You have a rash, stiff neck, or severe headache. ? You are irritable, sleepy, or difficult to awaken. ? You are weak, dizzy, or extremely thirsty.  You have bright red blood in your stool or you have black tarry stools.  You have unusual abdominal swelling that is painful.  You vomit continuously.  You vomit blood (hematemesis).  You have both abdominal pain and a fever. This information is not intended to replace advice given to you by your health care provider. Make sure you discuss any questions you have with your health care provider. Document Released: 11/25/2005 Document Revised: 04/26/2016 Document Reviewed: 08/12/2014 Elsevier Interactive Patient Education  2018 [employer].

## 2018-03-12 ENCOUNTER — Encounter: Payer: Self-pay | Admitting: Genetic Counselor

## 2018-03-12 ENCOUNTER — Telehealth: Payer: Self-pay | Admitting: Genetic Counselor

## 2018-03-12 ENCOUNTER — Telehealth: Payer: Self-pay | Admitting: *Deleted

## 2018-03-12 NOTE — Telephone Encounter (Signed)
Dr. Theora Gianotti is referring Ms. Evola for genetic counseling due to a personal and family history of cancer. I left her a message to call and schedule this telegenetics visit to be done by phone at her convenience.   Steele Berg, [place], [place] Genetic Counselor Phone: [phone]

## 2018-03-12 NOTE — Telephone Encounter (Signed)
Cancer Genetics            Telegenetics Initial Visit    Patient Name: Jessica Dalton Patient DOB: [birth year] Patient Age: 67 y.o. Phone Call Date: 03/12/2018  Referring Provider: Santiago Glad, MD  Reason for Visit: Evaluate for hereditary susceptibility to cancer    Assessment and Plan:  . Jessica Dalton's personal and family histories warrant a genetics evaluation for a hereditary predisposition to cancer. She has had more than 10 precancerous polyps. The combination of her own endometrial cancer with a strong family history of cancer is also concerning.  . Testing is recommended to determine whether she has a pathogenic mutation that will impact her screening and risk-reduction for cancer. A negative result will be generally reassuring.  . Jessica Dalton wished to pursue genetic testing, but wanted to have her insurance benefits confirmed first. Once that is available, she will schedule a lab visit for a blood draw. Analysis will include the 83 genes on [facility]'s Multi-Cancer panel (ALK, APC, ATM, AXIN2, BAP1, BARD1, BLM, BMPR1A, BRCA1, BRCA2, BRIP1, CASR, CDC73, CDH1, CDK4, CDKN1B, CDKN1C, CDKN2A, CEBPA, CHEK2, CTNNA1, DICER1, DIS3L2, EGFR, EPCAM, FH, FLCN, GATA2, GPC3, GREM1, HOXB13, HRAS, KIT, MAX, MEN1, MET, MITF, MLH1, MSH2, MSH3, MSH6, MUTYH, NBN, NF1, NF2, NTHL1, PALB2, PDGFRA, PHOX2B, PMS2, POLD1, POLE, POT1, PRKAR1A, PTCH1, PTEN, RAD50, RAD51C, RAD51D, RB1, RECQL4, RET, RUNX1, SDHA, SDHAF2, SDHB, SDHC, SDHD, SMAD4, SMARCA4, SMARCB1, SMARCE1, STK11, SUFU, TERC, TERT, TMEM127, TP53, TSC1, TSC2, VHL, WRN, WT1).   . Once the lab receives her specimen, results should be available in approximately 2-3 weeks, at which point we will contact her and address implications for her as well as address genetic testing for at-risk family members, if needed.     Dr. Grayland Ormond was available for questions concerning this case. Total time spent by counseling by phone was  approximately 25 minutes.   _____________________________________________________________________   History of Present Illness: Ms. Shanina Dalton, a 67 y.o. female, was referred for genetic counseling to discuss the possibility of a hereditary predisposition to cancer and discuss whether genetic testing is warranted. This was a telegenetics visit via phone.  Jessica Dalton was diagnosed with endometrial cancer at the age of 11.  In 09/2017, she had her first colonoscopy where approximately 12 SSAs and tubular adenomas were found throughout her colon. Hyperplastic polyps were also found.  Past Medical History:  Diagnosis Date  . A-fib ([place])   . Anemia   . Asthma   . Cancer ([place])    uterine ca  . Chronic cough   . COPD (chronic obstructive pulmonary disease) ([place])   . Endometrial cancer ([place])   . Gallstones 10/16/2016  . Hypoxemia   . Obesity   . Oxygen dependent   . Pneumonia   . Sleep apnea   . Tachycardia   . Tobacco use     Past Surgical History:  Procedure Laterality Date  . ABDOMINAL HYSTERECTOMY    . CATARACT EXTRACTION, BILATERAL  2015  . CHOLECYSTECTOMY N/A 01/23/2017   Procedure: LAPAROSCOPIC CHOLECYSTECTOMY WITH INTRAOPERATIVE CHOLANGIOGRAM;  Surgeon: Robert Bellow, MD;  Location: [facility] ORS;  Service: General;  Laterality: N/A;  . COLONOSCOPY WITH PROPOFOL N/A 09/08/2017   Procedure: COLONOSCOPY WITH PROPOFOL;  Surgeon: Manya Silvas, MD;  Location: [facility] ENDOSCOPY;  Service: Endoscopy;  Laterality: N/A;  . ESOPHAGOGASTRODUODENOSCOPY (EGD) WITH PROPOFOL N/A 09/08/2017   Procedure: ESOPHAGOGASTRODUODENOSCOPY (EGD) WITH PROPOFOL;  Surgeon: Manya Silvas, MD;  Location: [facility] ENDOSCOPY;  Service: Endoscopy;  Laterality: N/A;  . PELVIC LYMPH NODE DISSECTION N/A 10/16/2016   Procedure: PELVIC LYMPH NODE DISSECTION;  Surgeon: Gillis Ends, MD;  Location: [facility] ORS;  Service: Gynecology;  Laterality: N/A;  . ROBOTIC ASSISTED TOTAL HYSTERECTOMY WITH BILATERAL  SALPINGO OOPHERECTOMY Bilateral 10/16/2016   Procedure: ROBOTIC ASSISTED TOTAL HYSTERECTOMY WITH BILATERAL SALPINGO OOPHORECTOMY;  Surgeon: Gillis Ends, MD;  Location: [facility] ORS;  Service: Gynecology;  Laterality: Bilateral;  . SENTINEL NODE BIOPSY N/A 10/16/2016   Procedure: SENTINEL NODE INJECTION;  Surgeon: Gillis Ends, MD;  Location: [facility] ORS;  Service: Gynecology;  Laterality: N/A;  . TUBAL LIGATION      Family History: Significant diagnoses include the following:  Family History  Problem Relation Age of Onset  . Kidney cancer Mother 68       deceased 17  . Colon cancer Father 52       deceased 7  . Melanoma Sister 75       deceased 80  . Uterine cancer Sister 73  . Cancer Paternal Grandmother        unk. age or type  . Cancer Other        maternal and paternal aunts/uncles with cancer; no specifics available    Additionally, Jessica Dalton has a daughter (age 14) and two sons (ages 28 and 36). She had one sister (noted above) who had no children. She does not have much concrete information about her extended family and indicated aunts and uncles on both sides of her family had cancer, but she did not know the ages at diagnoses or the types of cancers.  Jessica Dalton ancestry is Caucasian - NOS. There is no known Jewish ancestry and no consanguinity.  Discussion: We reviewed the characteristics, features and inheritance patterns of hereditary cancer syndromes. We discussed her risk of harboring a mutation in the context of her personal and family history. We discussed that her unknown family history makes risk assessment challenging. We discussed the process of genetic testing, insurance coverage and implications of results: positive, negative and variant of unknown significance (VUS).   Ms. Bieser questions were answered to her satisfaction today and she is welcome to call with any additional questions or concerns. Thank you for the referral and allowing  [place] to share in the care of your patient.    Steele Berg, MS, [place] Certified Genetic Counselor phone: [phone]

## 2018-03-12 NOTE — Telephone Encounter (Signed)
Voicemail left for patient to call back regarding [employer] smoking cessation program.

## 2018-03-13 ENCOUNTER — Telehealth: Payer: Self-pay | Admitting: *Deleted

## 2018-03-13 NOTE — Telephone Encounter (Signed)
Contacted patient and reviewed [employer] smoking cessation program. Given information about upcoming classes. Patient verbalizes understanding and has contact information to sign up if desired.

## 2018-03-16 ENCOUNTER — Other Ambulatory Visit: Payer: Self-pay | Admitting: Genetic Counselor

## 2018-03-16 DIAGNOSIS — Z8542 Personal history of malignant neoplasm of other parts of uterus: Secondary | ICD-10-CM

## 2018-03-20 ENCOUNTER — Encounter
Admission: RE | Admit: 2018-03-20 | Discharge: 2018-03-20 | Disposition: A | Discharge status: Home / Self Care | Payer: Medicare HMO | Source: Ambulatory Visit | Attending: Nurse Practitioner | Admitting: Nurse Practitioner

## 2018-03-20 ENCOUNTER — Inpatient Hospital Stay: Payer: Medicare HMO

## 2018-03-20 DIAGNOSIS — C541 Malignant neoplasm of endometrium: Secondary | ICD-10-CM | POA: Diagnosis present

## 2018-03-20 LAB — GLUCOSE, CAPILLARY: Glucose-Capillary: 86 mg/dL (ref 65–99)

## 2018-03-20 MED ORDER — FLUDEOXYGLUCOSE F - 18 (FDG) INJECTION
12.2400 | Freq: Once | INTRAVENOUS | Status: AC | PRN
  Administered 2018-03-20: 12.24 via INTRAVENOUS

## 2018-03-23 ENCOUNTER — Telehealth: Payer: Self-pay

## 2018-03-23 NOTE — Telephone Encounter (Signed)
Called and went over PET scan results. She is happy to hear that there is no current abnormal hypermetabolic activity to suggest active malignancy, but she want to know if she "can take something". She states she is miserable with these chronic abdominal symptoms. No change in porta hepatis lymph node that has been seen on prior studies. Lung nodule is being followed by lung cancer screening program. Encouraged her to reach out to [facility] to see if number 2 on the PET scan impression can be addressed or if it could be contributing to her symptoms. She verbalized she would contact them. Note routed to NP, Beckey Rutter, for any further suggestions regarding results and abdominal symptoms.  IMPRESSION: 1. No current abnormal hypermetabolic activity to suggest active malignancy. The mildly enlarged porta hepatis lymph nodes are not hypermetabolic and likely reactive. 2. There are some scattered air-fluid levels in nondilated small bowel loops which could reflect a mild enteritis. No overt bowel wall thickening is observed. 3. Chronically stable 7 by 5 mm left lower lobe lung nodule, not appreciably hypermetabolic but below sensitive PET-CT size thresholds. No change from earliest available comparison of 09/24/2016. 4. Other imaging findings of potential clinical significance: Aortic Atherosclerosis (ICD10-I70.0) and Emphysema (ICD10-J43.9). Small left adrenal adenoma. Lower lumbar facet arthropathy. Stable lucency along the left acetabular roof, probably a geode or degenerative subcortical cystic lesion, not hypermetabolic.     Oncology Nurse Navigator Documentation  Navigator Location: [facility] (03/23/18 1300)   )Navigator Encounter Type: Telephone;Diagnostic Results (03/23/18 1300) Telephone: Outgoing Call;Diagnostic Results (03/23/18 1300)                                                  Time Spent with Patient: 15 (03/23/18 1300)

## 2018-03-30 ENCOUNTER — Telehealth: Payer: Self-pay | Admitting: Genetic Counselor

## 2018-03-30 NOTE — Telephone Encounter (Signed)
Cancer Genetics             Telegenetics Results Disclosure   Patient Name: Jessica Dalton Patient DOB: 10/01/1951 Patient Age: 67 y.o. Phone Call Date: 03/30/2018  Referring Provider: Santiago Glad, MD   Jessica Dalton was called today to discuss genetic test results. Please see the Genetics telephone note from 03/12/2018 for a detailed discussion of her personal and family histories and the recommendations provided.  Genetic Testing: At the time of Jessica Dalton's telegenetics visit, she decided to pursue genetic testing of multiple genes associated with hereditary susceptibility to cancer. Testing included sequencing and deletion/duplication analysis. Testing did not reveal a pathogenic mutation in any of the genes analyzed.  A copy of the genetic test report will be scanned into Epic under the Media tab.  The genes analyzed were the 83 genes on [facility]'s Multi-Cancer panel (ALK, APC, ATM, AXIN2, BAP1, BARD1, BLM, BMPR1A, BRCA1, BRCA2, BRIP1, CASR, CDC73, CDH1, CDK4, CDKN1B, CDKN1C, CDKN2A, CEBPA, CHEK2, CTNNA1, DICER1, DIS3L2, EGFR, EPCAM, FH, FLCN, GATA2, GPC3, GREM1, HOXB13, HRAS, KIT, MAX, MEN1, MET, MITF, MLH1, MSH2, MSH3, MSH6, MUTYH, NBN, NF1, NF2, NTHL1, PALB2, PDGFRA, PHOX2B, PMS2, POLD1, POLE, POT1, PRKAR1A, PTCH1, PTEN, RAD50, RAD51C, RAD51D, RB1, RECQL4, RET, RUNX1, SDHA, SDHAF2, SDHB, SDHC, SDHD, SMAD4, SMARCA4, SMARCB1, SMARCE1, STK11, SUFU, TERC, TERT, TMEM127, TP53, TSC1, TSC2, VHL, WRN, WT1).  Since the current test is not perfect, it is possible that there may be a gene mutation that current testing cannot detect, but that chance is small. It is possible that a different genetic factor, which has not yet been discovered or is not on this panel, is responsible for the cancer diagnoses in the family or her multiple colon polyps. Again, the likelihood of this is low. No additional testing is recommended at this time for Jessica Dalton.  A Variant of  Uncertain Significance was detected: RECQL4 [id number] (p.[id number]). This is still considered a normal result. While at this time, it is unknown if this finding is associated with increased cancer risk, the majority of these variants get reclassified to be inconsequential. Medical management should not be based on this finding. With time, we suspect the lab will determine the significance, if any. If we do learn more about it, we will try to contact Jessica Dalton to discuss it further. It is important to stay in touch with [place] periodically and keep the address and phone number up to date.  Cancer Screening: These results suggest that Jessica Dalton's history was most likely not due to an inherited predisposition. She is recommended to follow the cancer screening guidelines provided by her physician.   Family Members: Family members are at some increased risk of developing cancer, over the general population risk, simply due to the family history. They are recommended to speak with their own providers about appropriate cancer screenings.  Any relative who had cancer at a young age or had a particularly rare cancer may also wish to pursue genetic testing. Genetic counselors can be located in other cities, by visiting the website of the [employer] ([employer]se) and Field seismologist for a Dietitian by zip code.   Family members are not recommended to get tested for the above VUS outside of a research protocol as this finding has no implications for their medical management.  Lastly, cancer genetics is a rapidly advancing field and it is possible  that new genetic tests will be appropriate for Jessica Dalton in the future. We encourage Jessica Dalton to remain in contact with Genetics on an annual basis so her personal and family histories can be updated.    Steele Berg, MS, [place] Certified Genetic Counselor phone: [phone]

## 2018-06-24 ENCOUNTER — Encounter: Payer: Self-pay | Admitting: Oncology

## 2018-08-25 ENCOUNTER — Ambulatory Visit: Payer: Medicare HMO | Admitting: Pulmonary Disease

## 2018-09-09 ENCOUNTER — Ambulatory Visit (INDEPENDENT_AMBULATORY_CARE_PROVIDER_SITE_OTHER): Payer: Medicare HMO | Admitting: Pulmonary Disease

## 2018-09-09 ENCOUNTER — Encounter: Payer: Self-pay | Admitting: Pulmonary Disease

## 2018-09-09 VITALS — BP 124/74 | HR 85 | Resp 16 | Ht 61.0 in | Wt 227.0 lb

## 2018-09-09 DIAGNOSIS — E668 Other obesity: Secondary | ICD-10-CM | POA: Diagnosis not present

## 2018-09-09 DIAGNOSIS — R0609 Other forms of dyspnea: Secondary | ICD-10-CM

## 2018-09-09 DIAGNOSIS — F17200 Nicotine dependence, unspecified, uncomplicated: Secondary | ICD-10-CM

## 2018-09-09 DIAGNOSIS — R911 Solitary pulmonary nodule: Secondary | ICD-10-CM | POA: Diagnosis not present

## 2018-09-09 DIAGNOSIS — J449 Chronic obstructive pulmonary disease, unspecified: Secondary | ICD-10-CM | POA: Diagnosis not present

## 2018-09-09 MED ORDER — UMECLIDINIUM-VILANTEROL 62.5-25 MCG/INH IN AEPB: INHALATION_SPRAY | RESPIRATORY_TRACT | 5 refills | Status: DC

## 2018-09-09 MED ORDER — VARENICLINE TARTRATE 1 MG PO TABS: 1.0000 mg | ORAL_TABLET | Freq: Two times a day (BID) | ORAL | 6 refills | Status: DC

## 2018-09-09 MED ORDER — ALBUTEROL SULFATE HFA 108 (90 BASE) MCG/ACT IN AERS: 2.0000 | INHALATION_SPRAY | Freq: Four times a day (QID) | RESPIRATORY_TRACT | 1 refills | Status: DC | PRN

## 2018-09-09 MED ORDER — VARENICLINE TARTRATE 0.5 MG X 11 & 1 MG X 42 PO MISC: ORAL | 0 refills | Status: DC

## 2018-09-09 NOTE — Progress Notes (Signed)
PULMONARY OFFICE FOLLOW UP  PROBLEMS: Smoker Chronic hypoxemic respiratory failure COPD  PT PROFILE: 67 y.o. F smoker hospitalized 11/08-11/10/17 for elective hysterectomy. Noted to be hypoxemic post operatively. Seen in consultation by Dr Stevenson Clinch. Discharged home on O2@ 2 LPM [name]  DATA: CXR 10/10/16: Hyperinflation and mild diffuse interstitial prominence CXR 12/20/16: pre-op cxr revealed partially loculated L pleural effusion CT chest 01/08/17: Partially loculated moderate left pleural effusion posterolateral leak. Airspace disease in the lingula and posterior left lower lobe.  CXR 01/15/17: reduction in L pleural effusion LDCT 09/17/17: Resolved left-sided pleural effusion. Left pleural thickening remains. Moderate centrilobular emphysema. Lingular and left lower lobe scarring or subsegmental atelectasis. A left lower lobe pulmonary nodule measures volume derived equivalent diameter 8.1 mm. Lung-RADS 4A, suspicious. Follow up low-dose chest CT without contrast in 3 months is recommended.  PFTs 02/05/18: FVC: 1.968 or than sign 2.23 L (77 >87 %pred), FEV1: 1.03 > 1.12 L (50 > 54 %pred), FEV1/FVC: 53%, TLC: 3.90 L (92 %pred), DLCO 9.9 (41 %pred), DL/VA 87% predicted LDCT 12/31/17: LLL pulmonary nodule is stable by CT screening criteria with volume derived equivalent diameter of 7.9 mm today. Lung-RADS 2, benign appearance or behavior. Continue annual screening with low-dose chest CT without contrast in 12 months. Interval progression of atelectasis and/or scarring in the peripheral lingula. Emphysema   INTERVAL HISTORY: Last seen 02/10/2018.  No major pulmonary events in the interim.  SUBJ: This is a scheduled follow-up.  She has no new complaints.  She continues to smoke, now up to a pack of cigarettes per day.  She tried nicotine replacement therapy without favorable outcome.  She also has chronic cough with mild to moderate sputum production.  She has no change in her exertional dyspnea.  She  believes it is exacerbated by hot humid weather.  She remains on an oral inhaler and believes it is beneficial.  She indicates that she is "not really using" her albuterol rescue inhaler.  She has lost 9 pounds since last visit.  She denies CP, fever, purulent sputum, hemoptysis, LE edema and calf tenderness.  OBJ: Vitals:   09/09/18 1126 09/09/18 1132  BP:  124/74  Pulse:  85  Resp: 16   SpO2:  91%  Weight: 227 lb (103 kg)   Height: 5\' 1"  (1.549 m)   RA   EXAM: Gen: Appears older than her true age, NAD HEENT: NCAT, sclera white Neck: No JVD noted Lungs: Diffuse rhonchi, no wheezes Cardiovascular: RRR, no murmurs Abdomen: Soft, nontender, normal BS Ext: Tobacco stained digits without clubbing, cyanosis, edema Neuro: grossly intact Skin: Limited exam, no lesions noted    DATA: BMP Latest Ref Rng & Units 12/31/2017 09/03/2017 01/15/2017  Glucose 65 - 99 mg/dL - - 82  BUN 6 - 20 mg/dL - - 9  Creatinine 0.44 - 1.00 mg/dL 0.60 0.56 0.54  Sodium 135 - 145 mmol/L - - 141  Potassium 3.5 - 5.1 mmol/L - - 3.5  Chloride 101 - 111 mmol/L - - 101  CO2 22 - 32 mmol/L - - 30  Calcium 8.9 - 10.3 mg/dL - - 9.3    CBC Latest Ref Rng & Units 01/15/2017 11/15/2016 11/14/2016  WBC 3.6 - 11.0 K/uL 10.1 13.0(H) 12.3(H)  Hemoglobin 12.0 - 16.0 g/dL 14.0 13.6 13.1  Hematocrit 35.0 - 47.0 % 40.9 39.8 38.4  Platelets 150 - 440 K/uL 337 196 168    CXR: No new film  IMPRESSION: 1) Recalcitrant smoker 2) moderate COPD with chronic bronchitis 3)  moderate obesity 4) DOE - COPD + obesity 5) lung nodule noted previously on LDCT chest  Smoking cessation was again discussed in detail including the risks associated with continued smoking, the benefits of smoking cessation and strategies that might be helpful. After thorough consideration, he has decided to try Chantix   PLAN: Varenicline (Chantix) prescribed.  I have prescribe both the starter pack and continuation pack.  We discussed its mechanism of  action, potential benefits and also potential side effects.  She is to contact us if she has problems tolerating this medication.  Continue Anoro inhaler daily Continue albuterol inhaler as needed  Continue annual lung cancer screening with low dose CT scans. Next one due January 2020  Follow-up in 3 to 4 months.  Call sooner as needed  Merton Border, MD PCCM service Mobile [phone] Pager [phone] 09/10/2018 8:17 AM

## 2018-09-09 NOTE — Patient Instructions (Addendum)
We again discussed smoking cessation.  You have decided to try Chantix.  I have prescribe both the starter pack and continuation pack.  Fill the starter pack first and take as prescribed.  Read all of the instructions that come with the starter pack.  Continue Anoro inhaler daily Continue albuterol inhaler as needed  Continue annual lung cancer screening with low dose CT scans. Next one due January 2020  Follow-up in 3 to 4 months.  Call sooner as needed

## 2018-09-10 ENCOUNTER — Encounter: Payer: Self-pay | Admitting: Pulmonary Disease

## 2018-10-09 ENCOUNTER — Other Ambulatory Visit: Payer: Self-pay | Admitting: Family Medicine

## 2018-10-09 DIAGNOSIS — G8929 Other chronic pain: Secondary | ICD-10-CM

## 2018-10-09 DIAGNOSIS — M5442 Lumbago with sciatica, left side: Principal | ICD-10-CM

## 2018-10-09 DIAGNOSIS — M5441 Lumbago with sciatica, right side: Principal | ICD-10-CM

## 2018-10-28 ENCOUNTER — Ambulatory Visit
Admission: RE | Admit: 2018-10-28 | Discharge: 2018-10-28 | Disposition: A | Discharge status: Home / Self Care | Payer: Medicare HMO | Source: Ambulatory Visit | Attending: Family Medicine | Admitting: Family Medicine

## 2018-10-28 DIAGNOSIS — M5127 Other intervertebral disc displacement, lumbosacral region: Secondary | ICD-10-CM | POA: Insufficient documentation

## 2018-10-28 DIAGNOSIS — M48061 Spinal stenosis, lumbar region without neurogenic claudication: Secondary | ICD-10-CM | POA: Diagnosis not present

## 2018-10-28 DIAGNOSIS — G8929 Other chronic pain: Secondary | ICD-10-CM | POA: Insufficient documentation

## 2018-10-28 DIAGNOSIS — M5442 Lumbago with sciatica, left side: Secondary | ICD-10-CM | POA: Insufficient documentation

## 2018-10-28 DIAGNOSIS — M5125 Other intervertebral disc displacement, thoracolumbar region: Secondary | ICD-10-CM | POA: Insufficient documentation

## 2018-10-28 DIAGNOSIS — M5441 Lumbago with sciatica, right side: Secondary | ICD-10-CM | POA: Diagnosis present

## 2018-11-13 ENCOUNTER — Other Ambulatory Visit: Payer: Self-pay | Admitting: Neurosurgery

## 2018-11-13 DIAGNOSIS — R200 Anesthesia of skin: Secondary | ICD-10-CM

## 2018-11-13 DIAGNOSIS — M5124 Other intervertebral disc displacement, thoracic region: Secondary | ICD-10-CM

## 2018-11-24 DIAGNOSIS — G8929 Other chronic pain: Secondary | ICD-10-CM | POA: Insufficient documentation

## 2018-11-29 ENCOUNTER — Ambulatory Visit
Admission: RE | Admit: 2018-11-29 | Discharge: 2018-11-29 | Disposition: A | Discharge status: Home / Self Care | Payer: Medicare HMO | Source: Ambulatory Visit | Attending: Neurosurgery | Admitting: Neurosurgery

## 2018-11-29 DIAGNOSIS — M4802 Spinal stenosis, cervical region: Secondary | ICD-10-CM | POA: Diagnosis not present

## 2018-11-29 DIAGNOSIS — R200 Anesthesia of skin: Secondary | ICD-10-CM | POA: Insufficient documentation

## 2018-11-29 DIAGNOSIS — M5124 Other intervertebral disc displacement, thoracic region: Secondary | ICD-10-CM | POA: Insufficient documentation

## 2018-12-14 ENCOUNTER — Other Ambulatory Visit: Payer: Self-pay | Admitting: Neurosurgery

## 2018-12-14 DIAGNOSIS — M4802 Spinal stenosis, cervical region: Secondary | ICD-10-CM

## 2018-12-19 ENCOUNTER — Telehealth: Payer: Self-pay

## 2018-12-19 NOTE — Telephone Encounter (Signed)
Call pt regarding lung screening. Left message for pt to return call.  

## 2018-12-25 ENCOUNTER — Telehealth: Payer: Self-pay

## 2018-12-25 NOTE — Telephone Encounter (Signed)
Call pt regarding lung screening. Left message for pt to return call.  

## 2018-12-28 ENCOUNTER — Ambulatory Visit
Admission: RE | Admit: 2018-12-28 | Discharge: 2018-12-28 | Disposition: A | Discharge status: Home / Self Care | Payer: Medicare HMO | Source: Ambulatory Visit | Attending: Neurosurgery | Admitting: Neurosurgery

## 2018-12-28 ENCOUNTER — Telehealth: Payer: Self-pay | Admitting: *Deleted

## 2018-12-28 DIAGNOSIS — M4802 Spinal stenosis, cervical region: Secondary | ICD-10-CM | POA: Diagnosis not present

## 2018-12-28 NOTE — Telephone Encounter (Signed)
Attempted to contact patient r/t LDCT Screening follow up due at this time.  No answer received, message left for patient to call [phone] to schedule appointment.    

## 2019-01-12 ENCOUNTER — Telehealth: Payer: Self-pay | Admitting: Nurse Practitioner

## 2019-01-12 NOTE — Telephone Encounter (Signed)
Called patient. She has not seen her gyn at 6 month interval and says she prefers to see Dr. Theora Gianotti. Will contact Mariea Clonts to help move up her annual visit then have her follow up with Dr. Theora Gianotti for surveillance every 6 months per guidelines. Also reminded patient that she is due for her LDCT scan. She says she's just been putting it off but is willing to reschedule. Will notify Mallie Snooks to call patient with time/date.

## 2019-01-29 ENCOUNTER — Telehealth: Payer: Self-pay | Admitting: *Deleted

## 2019-01-29 ENCOUNTER — Encounter: Payer: Self-pay | Admitting: *Deleted

## 2019-01-29 ENCOUNTER — Telehealth: Payer: Self-pay

## 2019-01-29 DIAGNOSIS — Z122 Encounter for screening for malignant neoplasm of respiratory organs: Secondary | ICD-10-CM

## 2019-01-29 NOTE — Telephone Encounter (Signed)
Call pt regarding lung screening. Pt is a current smoker, smoking about 1/2 to 1 pack per day. Pt would like a midmorning appt. Pt is currently getting injection in her back.

## 2019-01-29 NOTE — Telephone Encounter (Signed)
Patient has been notified that the annual lung cancer screening low dose CT scan is due currently or will be in the near future.  Confirmed that the patient is within the age range of 25-80, and asymptomatic, and currently exhibits no signs or symptoms of lung cancer.  Patient denies illness that would prevent curative treatment for lung cancer if found.  Verified smoking history, current smoker with 79.5pkyr hx .  The shared decision making visit was completed on 09-17-17.  Patient is agreeable for the CT scan to be scheduled.  Will call patient back with date and time of appointment.

## 2019-02-01 ENCOUNTER — Telehealth: Payer: Self-pay | Admitting: *Deleted

## 2019-02-01 NOTE — Telephone Encounter (Signed)
Called pt to inform her of her appt for ldct screening on Thursday 02/04/2019 @ 10:45am here @ [facility], voiced understanding

## 2019-02-03 ENCOUNTER — Inpatient Hospital Stay: Payer: Medicare HMO | Attending: Obstetrics and Gynecology | Admitting: Obstetrics and Gynecology

## 2019-02-03 ENCOUNTER — Other Ambulatory Visit: Payer: Self-pay | Admitting: Family Medicine

## 2019-02-03 ENCOUNTER — Other Ambulatory Visit: Payer: Self-pay

## 2019-02-03 ENCOUNTER — Encounter: Payer: Self-pay | Admitting: *Deleted

## 2019-02-03 VITALS — BP 169/81 | HR 96 | Temp 96.9°F | Resp 18 | Wt 237.0 lb

## 2019-02-03 DIAGNOSIS — Z90710 Acquired absence of both cervix and uterus: Secondary | ICD-10-CM | POA: Diagnosis not present

## 2019-02-03 DIAGNOSIS — L309 Dermatitis, unspecified: Secondary | ICD-10-CM | POA: Insufficient documentation

## 2019-02-03 DIAGNOSIS — F17210 Nicotine dependence, cigarettes, uncomplicated: Secondary | ICD-10-CM

## 2019-02-03 DIAGNOSIS — Z1231 Encounter for screening mammogram for malignant neoplasm of breast: Secondary | ICD-10-CM

## 2019-02-03 DIAGNOSIS — I4891 Unspecified atrial fibrillation: Secondary | ICD-10-CM

## 2019-02-03 DIAGNOSIS — L308 Other specified dermatitis: Secondary | ICD-10-CM

## 2019-02-03 DIAGNOSIS — Z90722 Acquired absence of ovaries, bilateral: Secondary | ICD-10-CM | POA: Diagnosis not present

## 2019-02-03 DIAGNOSIS — Z08 Encounter for follow-up examination after completed treatment for malignant neoplasm: Secondary | ICD-10-CM | POA: Insufficient documentation

## 2019-02-03 DIAGNOSIS — J90 Pleural effusion, not elsewhere classified: Secondary | ICD-10-CM | POA: Insufficient documentation

## 2019-02-03 DIAGNOSIS — Z8542 Personal history of malignant neoplasm of other parts of uterus: Secondary | ICD-10-CM

## 2019-02-03 NOTE — Progress Notes (Signed)
Here for follow up. Overall " I feel fine"  No vaginal d/c per pt.

## 2019-02-03 NOTE — Progress Notes (Signed)
Gynecologic Oncology Interval Visit   Referring Provider: Boykin Nearing, MD [address] [facility] [place], [place] [address] [phone]  Chief Concern: surveillance visit for endometrial cancer  Subjective:  Jessica Dalton is a 68 y.o. female who is seen in consultation from Dr. Ouida Sills for grade 1 endometrial cancer, who returns to clinic for surveillance today.   She was last seen in clinic on 03/11/2018 and had a negative exam. She had abdominal complaints which prompted imaging. She was supposed to see Dr. Ouida Sills but did not have that appointment. She would like to see one of his partners. She feels more comfortable with a female provider.    PET 03/20/2018 IMPRESSION: 1. No current abnormal hypermetabolic activity to suggest active malignancy. The mildly enlarged porta hepatis lymph nodes are not hypermetabolic and likely reactive. 2. There are some scattered air-fluid levels in nondilated small bowel loops which could reflect a mild enteritis. No overt bowel wall thickening is observed. 3. Chronically stable 7 by 5 mm left lower lobe lung nodule, not appreciably hypermetabolic but below sensitive PET-CT size thresholds. No change from earliest available comparison of 09/24/2016. 4. Other imaging findings of potential clinical significance: Aortic Atherosclerosis (ICD10-I70.0) and Emphysema (ICD10-J43.9). Small left adrenal adenoma. Lower lumbar facet arthropathy. Stable lucency along the left acetabular roof, probably a geode or degenerative subcortical cystic lesion, not hypermetabolic.  She continues to have multiple GI complaints and is followed by GI for these. Last colonoscopy & endoscopy on 09/08/2017. She has not had mammogram in 'long time' due to preference. She continues to have chronic shortness of breath, cough, abdominal pain with bloating. She requests female gyn providers only.     Gynecologic Oncology History She was referred  to Dr. Ouida Sills by Dr. Netty Starring for PMB.  grade 1 endometrial cancer. EMBx and Pap performed on 09/09/2016. Uterus sounded to 10 cm. 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 Pap NILM.   Dr. Ouida Sills also ordered a CT scan to evaluate upper abdominal pain symptoms.    On 10/16/2016 she underwent RA-TLH BSO and SLN injection, mapping and biopsy with Dr. Theora Gianotti. Her intraoperative and postoperative course was significant for pulmonary issues. She had desaturation due to chronic pulmonary disease from smoking.   09/24/2016 preoperative CT scan A/P revealed the following: IMPRESSION: 1. Single borderline enlarged portal hepatic lymph node. No additional findings suspicious for or suggestive of metastatic disease. Further imaging with PET-CT may be helpful to assess for hyper metabolism within this lymph node. 2. Left adrenal nodule, indeterminate. This could be better assessed with adrenal protocol MRI or PET-CT. 3. Thickening of the endometrium which in a postmenopausal female is considered abnormal and consistent with the clinical history of endometrial sarcoma. Stones identified within the gallbladder are noted measuring up to 3 cm.   I reviewed the findings preoperatively and spoke with Dr. Bary Castilla to see if he could be available for cholecystectomy as she was symptomatic with the gallstones. Unfortunately he was not available but recommended postoperative referral.  Preop CXR:  IMPRESSION: Hyperinflation and mild diffuse interstitial prominence consistent with the history of reactive airway disease. There is no pneumonia, CHF, nor other acute cardiopulmonary abnormality.  Aortic atherosclerosis.  Pathology noted as below:  Cytology: Negative  DIAGNOSIS:  A. SENTINEL LYMPH NODE, LEFT; EXCISION:  - NO TUMOR SEEN IN ONE LYMPH NODE (0/1).   B. SENTINEL LYMPH NODE, LEFT; EXCISION:  - NO TUMOR SEEN IN TWO LYMPH NODES (0/2).   C. UTERUS WITH CERVIX,  BILATERAL  FALLOPIAN TUBES AND OVARIES;  HYSTERECTOMY WITH BILATERAL SALPINGO-OOPHORECTOMY:  - ENDOMETRIOID ADENOCARCINOMA FIGO I.  - SURFACE DEGENERATIVE PAPILLARY FEATURES.  - MYOMETRIAL INVASION IS NOT IDENTIFIED.  - CHRONIC CERVICITIS.  - STROMAL HYPERPLASIA OF BILATERAL OVARIES.  - NO PATHOLOGIC CHANGE, BILATERAL FALLOPIAN TUBES.  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    ENDOMETRIUM: Hysterectomy, With or Without Other Organs or Tissues  Specimens InvolvedA: Sentinel lymph node, left  B: Sentinel lymph node, right proximal obturator  C: Uterus with cervix, bilateral tubes and ovaries   Endometrium, Hysterectomy, With or Without Other Organs or Tissues  Cancer Case Summary  Specimen: Uterine corpus  SPECIMEN  Procedure  Simple hysterectomy  Additional Procedures:  Bilateral salpingo-oophorectomy  Lymph Node Sampling:   Performed  Pelvic lymph nodes  Specimen Integrity: Intact hysterectomy specimen  TUMOR  Histologic Type:  Endometrioid adenocarcinoma, not otherwise  characterized  Histologic Grade:  FIGO grade 1  EXTENT  Tumor Size:  Greatest dimension (cm)  4.2cm  Myometrial Invasion:   Not identified  Involvement of Cervix:  Not involved  Other Organs Submitted: Right ovary  Not involved  Left ovary  Not involved  Right fallopian tube  Not involved  Left fallopian tube  Not involved  ACCESSORY FINDINGS  Lymph-Vascular Invasion: Not identified  STAGE (pTNM [FIGO])  Primary Tumor (pT):  pT1a [IA]: Tumor limited to endometrium or invades less than one-half  of the myometrium  Regional Lymph Nodes (pN)  pN0: No regional lymph node metastasis  Pelvic Lymph Nodes: Number of Lymph Nodes Examined:  Specify  3  Number of Lymph Nodes Involved:  Specify  0  Para-aortic Lymph Nodes: No para-aortic nodes submitted or found  Distant Metastasis (pM): Not applicable   Since surgery she was seen by Dr. Bary Castilla on 11/06/2016  with the plan to proceed with Laparoscopic Cholecystectomy with Intraoperative Cholangiogram. She had a consult with Dr. Rogue Bussing for erythocytosis - work up still ongoing. She was admitted on 11/11/2016 throught 11/15/2016 with sepsis, pneumonia, COPD exacerbation,  dyspnea, and new onset atrial fibrillation. She is being treated by Dr. Netty Starring and Dr. Clayborn Bigness.  Her cholecystectomy was canceled due to her other comorbidities.   In 2/18 Dr Bary Castilla performed LS cholecystectomy without incident.    Low Dose CT Lung Cancer Screening Follow-up done on 12/31/2017 - Left lower lobe pulmonary nodule is stable with volume derived diameter of 7.58m. Lung-Rads 2. Repeat in one year. Also noted: interval progression of atelectasis and/or scarring of the peripheral lingula, emphysema, and aortic atherosclerosis.  CT C/A/P 12/31/2017 IMPRESSION: 1. Stable exam.  No new or progressive interval findings. 2. Small left lower lobe pulmonary nodule unchanged in the 1.5 year interval since prior study consistent with benign etiology. 3. No change 15 mm porta hepatis lymph node identified on prior study. 4. Stable 14 mm left adrenal nodule, likely adenoma. 5.  Aortic Atherosclerois (ICD10-170.0)  IMPRESSION from CT Chest LCS 1. Left lower lobe pulmonary nodule is stable by CT screening criteria with volume derived equivalent diameter of 7.9 mm today. Lung-RADS 2, benign appearance or behavior. Continue annual screening with low-dose chest CT without contrast in 12 months. 2. Interval progression of atelectasis and/or scarring in the peripheral lingula. 3.  Emphysema. (ICD10-J43.9) 4.  Aortic Atherosclerois (ICD10-170.0)  She has been NED since her endometrial cancer surgery.    Other issues H/o Nystatin use for dermatologic candidiasis with improvement.   H/o colonic polyps 09/08/2017 colonoscopy - multiple  polyps with tubular adenoma, sessile serrated adenomas and hyperplastic polyps. Hiatal hernia,  gastritis, esophagitis.  09/08/2017 endoscopy - esophagitis and gastritis  GI recommended repeat colonoscopy in 2020 and genetic testing.  Per patient she was treated for helicobacter but has not had follow up. Per GI note she does not meet criteria for IBS.   Genetic testing: Testing did not reveal a pathogenic mutation in any of the genes analyzed. A copy of the genetic test report will be scanned into Epic under the Media tab.  The genes analyzed were the 83 genes on [facility]'s Multi-Cancer panel (ALK, APC, ATM, AXIN2, BAP1, BARD1, BLM, BMPR1A, BRCA1, BRCA2, BRIP1, CASR, CDC73, CDH1, CDK4, CDKN1B, CDKN1C, CDKN2A, CEBPA, CHEK2, CTNNA1, DICER1, DIS3L2, EGFR, EPCAM, FH, FLCN, GATA2, GPC3, GREM1, HOXB13, HRAS, KIT, MAX, MEN1, MET, MITF, MLH1, MSH2, MSH3, MSH6, MUTYH, NBN, NF1, NF2, NTHL1, PALB2, PDGFRA, PHOX2B, PMS2, POLD1, POLE, POT1, PRKAR1A, PTCH1, PTEN, RAD50, RAD51C, RAD51D, RB1, RECQL4, RET, RUNX1, SDHA, SDHAF2, SDHB, SDHC, SDHD, SMAD4, SMARCA4, SMARCB1, SMARCE1, STK11, SUFU, TERC, TERT, TMEM127, TP53, TSC1, TSC2, VHL, WRN, WT1).  A Variant of Uncertain Significance was detected: RECQL4 c.[id number]>T (p.Arg562Trp). This is still considered a normal result.    Problem List: Patient Active Problem List   Diagnosis Date Noted  . Personal history of tobacco use, presenting hazards to health 09/18/2017  . History of endometrial cancer 09/02/2017  . Pleural effusion 01/14/2017  . Constipation 12/04/2016  . Atrial fibrillation with RVR ([place]) 11/11/2016  . Erythrocytosis 11/08/2016  . Cigarette smoker   . Gallstones 10/16/2016  . Endometrial cancer determined by uterine biopsy ([place]) 10/16/2016  . Endometrial cancer ([place]) 09/18/2016    Past Medical History: Past Medical History:  Diagnosis Date  . A-fib ([place])   . Anemia   . Asthma   . Cancer ([place])    uterine ca  . Chronic cough   . COPD (chronic obstructive pulmonary disease) ([place])   . Endometrial cancer ([place])   . Gallstones 10/16/2016   . Hypoxemia   . Obesity   . Oxygen dependent   . Pneumonia   . Sleep apnea   . Tachycardia   . Tobacco use     Past Surgical History: Past Surgical History:  Procedure Laterality Date  . ABDOMINAL HYSTERECTOMY    . CATARACT EXTRACTION, BILATERAL  2015  . CHOLECYSTECTOMY N/A 01/23/2017   Procedure: LAPAROSCOPIC CHOLECYSTECTOMY WITH INTRAOPERATIVE CHOLANGIOGRAM;  Surgeon: Robert Bellow, MD;  Location: [facility] ORS;  Service: General;  Laterality: N/A;  . COLONOSCOPY WITH PROPOFOL N/A 09/08/2017   Procedure: COLONOSCOPY WITH PROPOFOL;  Surgeon: Manya Silvas, MD;  Location: [facility] ENDOSCOPY;  Service: Endoscopy;  Laterality: N/A;  . ESOPHAGOGASTRODUODENOSCOPY (EGD) WITH PROPOFOL N/A 09/08/2017   Procedure: ESOPHAGOGASTRODUODENOSCOPY (EGD) WITH PROPOFOL;  Surgeon: Manya Silvas, MD;  Location: [facility] ENDOSCOPY;  Service: Endoscopy;  Laterality: N/A;  . PELVIC LYMPH NODE DISSECTION N/A 10/16/2016   Procedure: PELVIC LYMPH NODE DISSECTION;  Surgeon: Gillis Ends, MD;  Location: [facility] ORS;  Service: Gynecology;  Laterality: N/A;  . ROBOTIC ASSISTED TOTAL HYSTERECTOMY WITH BILATERAL SALPINGO OOPHERECTOMY Bilateral 10/16/2016   Procedure: ROBOTIC ASSISTED TOTAL HYSTERECTOMY WITH BILATERAL SALPINGO OOPHORECTOMY;  Surgeon: Gillis Ends, MD;  Location: [facility] ORS;  Service: Gynecology;  Laterality: Bilateral;  . SENTINEL NODE BIOPSY N/A 10/16/2016   Procedure: SENTINEL NODE INJECTION;  Surgeon: Gillis Ends, MD;  Location: [facility] ORS;  Service: Gynecology;  Laterality: N/A;  . TUBAL LIGATION      Past Gynecologic History:  Menarche: unknown  Menstrual details: postmenopausal Last Menstrual Period: 15 years ago History of OCP/HRT use: None History of Abnormal pap: no, benign cellular changes Last pap: as per HPI   OB History:  OB History  Gravida Para Term Preterm AB Living  '3 3 3     3  '$ SAB TAB Ectopic Multiple Live Births               # Outcome Date GA Lbr  Len/2nd Weight Sex Delivery Anes PTL Lv  3 Term           2 Term           1 Term             Obstetric Comments  1st Menstrual Cycle:  ?   1st Pregnancy:  19      Family History: Family History  Problem Relation Age of Onset  . Kidney cancer Mother 24       deceased 64  . Colon cancer Father 73       deceased 61  . Melanoma Sister 57       deceased 4  . Uterine cancer Sister 32  . Cancer Paternal Grandmother        unk. age or type  . Cancer Other        maternal and paternal aunts/uncles with cancer; no specifics available    Social History: Social History   Socioeconomic History  . Marital status: Married    Spouse name: Not on file  . Number of children: Not on file  . Years of education: Not on file  . Highest education level: Not on file  Occupational History  . Not on file  Social Needs  . Financial resource strain: Not on file  . Food insecurity:    Worry: Not on file    Inability: Not on file  . Transportation needs:    Medical: Not on file    Non-medical: Not on file  Tobacco Use  . Smoking status: Current Every Day Smoker    Packs/day: 1.50    Years: 53.00    Pack years: 79.50  . Smokeless tobacco: Never Used  . Tobacco comment: 92 pk yr - 1.5 ppd; 52 years; started age 56  Substance and Sexual Activity  . Alcohol use: No  . Drug use: No  . Sexual activity: Not on file  Lifestyle  . Physical activity:    Days per week: Not on file    Minutes per session: Not on file  . Stress: Not on file  Relationships  . Social connections:    Talks on phone: Not on file    Gets together: Not on file    Attends religious service: Not on file    Active member of club or organization: Not on file    Attends meetings of clubs or organizations: Not on file    Relationship status: Not on file  . Intimate partner violence:    Fear of current or ex partner: Not on file    Emotionally abused: Not on file    Physically abused: Not on file    Forced sexual  activity: Not on file  Other Topics Concern  . Not on file  Social History Narrative  . Not on file    Allergies: Allergies  Allergen Reactions  . Bismuth Subsalicylate Nausea And Vomiting    Current Medications: Current Outpatient Medications  Medication Sig Dispense Refill  . apixaban (ELIQUIS) 5 MG TABS tablet Take  5 mg by mouth 2 (two) times daily.    [name] aspirin EC 81 MG EC tablet Take 1 tablet (81 mg total) by mouth daily.    . Calcium Carbonate-Vitamin D (OYSTER SHELL CALCIUM/D) 500-400 MG-UNIT TABS Take 1 tablet by mouth daily.     [name] diltiazem (CARDIZEM CD) 120 MG 24 hr capsule Take 1 capsule (120 mg total) by mouth daily. 30 capsule 0  . gabapentin (NEURONTIN) 300 MG capsule Take 600 mg by mouth 2 (two) times daily.    [name] umeclidinium-vilanterol (ANORO ELLIPTA) 62.5-25 MCG/INH AEPB INHALE ONE PUFF BY MOUTH ONCE DAILY 60 each 5  . acetaminophen (TYLENOL) 500 MG tablet Take 1,500 mg by mouth daily as needed for mild pain.    [name] albuterol (PROVENTIL HFA;VENTOLIN HFA) 108 (90 Base) MCG/ACT inhaler Inhale 2 puffs into the lungs every 6 (six) hours as needed for wheezing or shortness of breath. (Patient not taking: Reported on 02/03/2019) 1 Inhaler 1  . cyclobenzaprine (FLEXERIL) 5 MG tablet Take 5 mg by mouth 3 (three) times daily as needed.    . docusate sodium (COLACE) 100 MG capsule Take 1 capsule (100 mg total) by mouth daily as needed for mild constipation. (Patient not taking: Reported on 02/03/2019) 60 capsule 3  . furosemide (LASIX) 20 MG tablet Take 1 tablet (20 mg total) by mouth 2 (two) times daily. (Patient not taking: Reported on 02/03/2019) 60 tablet 0  . ibuprofen (ADVIL,MOTRIN) 200 MG tablet Take 400 mg by mouth every 6 (six) hours as needed for headache or moderate pain.    [name] nystatin (MYCOSTATIN/NYSTOP) powder Apply topically 3 (three) times daily. (Patient not taking: Reported on 02/03/2019) 60 g 0  . polyethylene glycol (MIRALAX / GLYCOLAX) packet Take 17 g by mouth at  bedtime.     . vitamin B-12 (CYANOCOBALAMIN) 1000 MCG tablet Take 1,000 mcg by mouth daily.     . Vitamin D, Ergocalciferol, (DRISDOL) 1.25 MG (50000 UT) CAPS capsule Take 50,000 Units by mouth once a week.     No current facility-administered medications for this visit.    Review of Systems General:  Weight gain ~ 10 lbs in 1 year Skin: no complaints Eyes: no complaints HEENT: no complaints Breasts: no complaints Pulmonary: cough, shortness of breath  Cardiac: leg swelling Gastrointestinal: abdominal bloating & distention, abdominal pain, decreased appetite Genitourinary/Sexual: no complaints Ob/Gyn: no complaints Musculoskeletal: no complaints Hematology: no complaints Neurologic/Psych: numbness & tingling   Objective:  Physical Examination:  BP (!) 169/81 (BP Location: Left Arm, Patient Position: Sitting)   Pulse 96   Temp (!) 96.9 F (36.1 C) (Tympanic)   Resp 18   Wt 237 lb (107.5 kg)   BMI 44.78 kg/m    ECOG Performance Status: 0 - Asymptomatic  General appearance: alert, cooperative and appears stated age HEENT:PERRLA, extra ocular movement intact and sclera clear, anicteric. CV: RRR Lungs: Wheezing on expiration bilaterally upper lobes; BS otherwise normal and good air movement.  Abdomen: soft, obese, nontender to palpation, distension difficult to determine given habitus, nontympanic, no hepatosplenomegaly, no masses or organomegaly and no hernias.  Back: inspection of back is normal Extremities: extremities normal, atraumatic, no cyanosis or edema  Skin: all incisions are well healed. Erythema under pannus improved compared to last exam. Worst area in LLQ.  Neurological exam reveals alert, oriented, normal speech, no focal findings or movement disorder noted.  Pelvic: exam chaperoned by nurse;  Vulva: normal appearing vulva with no masses, tenderness or lesions, but erythematous; Vagina: normal  vagina to palpation, cuff well healed, no masses. Uterus/cervix  surgically absent.  Recto/vaginal: deferred    Assessment:  Jessica Dalton is a 68 y.o. female with stage 1A, grade 1 endometrioid endometrial cancer.  Abdominal symptoms concerning may be GI related but differential also includes recurrence. Last CT scan and PET reassuring and no evidence of malignancy.   GI chronic symptoms.   Preoperative imaging revealed single borderline enlarged portal hepatic lymph node and left adrenal nodule, stable findings on repeat CT scan and no appreciable hypermetabolic findings on PET 03/8184.  History of pneumonia with abnormal CT scan in the setting of active tobacco use, stable pulmonary nodule.   Vulvitis and skin fold dermatitis, improved.    Medical co-morbidities complicating care: asthma, tobacco use, and obesity. Additional issues include recent hospitalization for sepsis, pneumonia, COPD exacerbation,  dyspnea, and atrial fibrillation, now resolved. Symptomatic cholelithiasis. Constipation.  Plan:   Problem List Items Addressed This Visit    None    Visit Diagnoses    History of uterine cancer    -  Primary   Dermatitis associated with moisture           Previously we discussed endometrial cancer status she has an excellent prognosis with early stage disease. Her risk of recurrence is less than 10%. I have recommended continued close follow up with exams, including pelvic exams every 6 months for 3-5 years and then annually thereafter.  Follow up with Dr. Tonette Bihari office in 6 month and [place] in one year. We encouraged her to continue to follow up with his office and given her preference to request an appointment with either Drs. Ward or Leafy Ro.   Continue to follow up CT lung cancer screening with Burgess Estelle, RN. She is willing to see him about smoking cessation.  Continue nystatin for dermatitis as needed.    Genetic testing negative for pathogenic mutation.   We recommended that she follow up with the GI team for her chronic  symptoms.   The patient's diagnosis, an outline of the further diagnostic and laboratory studies which will be required, the recommendation, and alternatives were discussed.  All questions were answered to the patient's satisfaction    I personally reviewed the patient's history, completed key elements of her exam (abdominal, inguinal nodes, pelvic exam), and was involved in decision making in conjunction with Ms. Allen. I finalized recommendations and plan of care.   [place] Gaetana Michaelis, MD  CC:  Boykin Nearing, MD [address] [facility] [place], [place] [address] [phone]  Dr. Dion Body

## 2019-02-03 NOTE — Patient Instructions (Signed)
Please make an appointment with either Dr. Leafy Ro or Dr. Leonides Schanz in 6 months for surveillance of endometrial cancer. Please schedule mammogram with your PCP.

## 2019-02-04 ENCOUNTER — Ambulatory Visit
Admission: RE | Admit: 2019-02-04 | Discharge: 2019-02-04 | Disposition: A | Discharge status: Home / Self Care | Payer: Medicare HMO | Source: Ambulatory Visit | Attending: Nurse Practitioner | Admitting: Nurse Practitioner

## 2019-02-04 DIAGNOSIS — Z122 Encounter for screening for malignant neoplasm of respiratory organs: Secondary | ICD-10-CM | POA: Diagnosis not present

## 2019-02-05 ENCOUNTER — Encounter: Payer: Self-pay | Admitting: *Deleted

## 2019-02-17 ENCOUNTER — Other Ambulatory Visit: Payer: Self-pay

## 2019-02-17 ENCOUNTER — Ambulatory Visit
Admission: RE | Admit: 2019-02-17 | Discharge: 2019-02-17 | Disposition: A | Discharge status: Home / Self Care | Payer: Medicare HMO | Source: Ambulatory Visit | Attending: Family Medicine | Admitting: Family Medicine

## 2019-02-17 DIAGNOSIS — Z1231 Encounter for screening mammogram for malignant neoplasm of breast: Secondary | ICD-10-CM

## 2019-03-10 ENCOUNTER — Ambulatory Visit: Payer: Medicare HMO

## 2019-09-10 DIAGNOSIS — K2100 Gastro-esophageal reflux disease with esophagitis, without bleeding: Secondary | ICD-10-CM | POA: Insufficient documentation

## 2019-12-17 ENCOUNTER — Other Ambulatory Visit
Admission: RE | Admit: 2019-12-17 | Discharge: 2019-12-17 | Disposition: A | Discharge status: Home / Self Care | Payer: Medicare HMO | Source: Ambulatory Visit | Attending: Internal Medicine | Admitting: Internal Medicine

## 2019-12-17 ENCOUNTER — Other Ambulatory Visit: Payer: Self-pay

## 2019-12-17 DIAGNOSIS — Z20822 Contact with and (suspected) exposure to covid-19: Secondary | ICD-10-CM | POA: Diagnosis not present

## 2019-12-17 DIAGNOSIS — Z01812 Encounter for preprocedural laboratory examination: Secondary | ICD-10-CM | POA: Insufficient documentation

## 2019-12-18 LAB — SARS CORONAVIRUS 2 (TAT 6-24 HRS): SARS Coronavirus 2: NEGATIVE

## 2019-12-21 ENCOUNTER — Encounter: Payer: Self-pay | Admitting: Internal Medicine

## 2019-12-22 ENCOUNTER — Encounter: Payer: Self-pay | Admitting: Internal Medicine

## 2019-12-22 ENCOUNTER — Encounter
Admission: RE | Disposition: A | Discharge status: Home / Self Care | Payer: Self-pay | Source: Home / Self Care | Attending: Internal Medicine

## 2019-12-22 ENCOUNTER — Ambulatory Visit
Admission: RE | Admit: 2019-12-22 | Discharge: 2019-12-22 | Disposition: A | Discharge status: Home / Self Care | Payer: Medicare HMO | Attending: Internal Medicine | Admitting: Internal Medicine

## 2019-12-22 ENCOUNTER — Ambulatory Visit: Payer: Medicare HMO | Admitting: Anesthesiology

## 2019-12-22 ENCOUNTER — Other Ambulatory Visit: Payer: Self-pay

## 2019-12-22 DIAGNOSIS — D649 Anemia, unspecified: Secondary | ICD-10-CM | POA: Insufficient documentation

## 2019-12-22 DIAGNOSIS — Z791 Long term (current) use of non-steroidal anti-inflammatories (NSAID): Secondary | ICD-10-CM | POA: Insufficient documentation

## 2019-12-22 DIAGNOSIS — R000 Tachycardia, unspecified: Secondary | ICD-10-CM | POA: Insufficient documentation

## 2019-12-22 DIAGNOSIS — Z9981 Dependence on supplemental oxygen: Secondary | ICD-10-CM | POA: Diagnosis not present

## 2019-12-22 DIAGNOSIS — Z1211 Encounter for screening for malignant neoplasm of colon: Secondary | ICD-10-CM | POA: Insufficient documentation

## 2019-12-22 DIAGNOSIS — Z8542 Personal history of malignant neoplasm of other parts of uterus: Secondary | ICD-10-CM | POA: Diagnosis not present

## 2019-12-22 DIAGNOSIS — Z7901 Long term (current) use of anticoagulants: Secondary | ICD-10-CM | POA: Insufficient documentation

## 2019-12-22 DIAGNOSIS — I4891 Unspecified atrial fibrillation: Secondary | ICD-10-CM | POA: Diagnosis not present

## 2019-12-22 DIAGNOSIS — Z90710 Acquired absence of both cervix and uterus: Secondary | ICD-10-CM | POA: Insufficient documentation

## 2019-12-22 DIAGNOSIS — G4730 Sleep apnea, unspecified: Secondary | ICD-10-CM | POA: Diagnosis not present

## 2019-12-22 DIAGNOSIS — J449 Chronic obstructive pulmonary disease, unspecified: Secondary | ICD-10-CM | POA: Diagnosis not present

## 2019-12-22 DIAGNOSIS — D123 Benign neoplasm of transverse colon: Secondary | ICD-10-CM | POA: Diagnosis not present

## 2019-12-22 DIAGNOSIS — E669 Obesity, unspecified: Secondary | ICD-10-CM | POA: Diagnosis not present

## 2019-12-22 DIAGNOSIS — Z888 Allergy status to other drugs, medicaments and biological substances status: Secondary | ICD-10-CM | POA: Diagnosis not present

## 2019-12-22 DIAGNOSIS — K621 Rectal polyp: Secondary | ICD-10-CM | POA: Diagnosis not present

## 2019-12-22 DIAGNOSIS — Z79899 Other long term (current) drug therapy: Secondary | ICD-10-CM | POA: Diagnosis not present

## 2019-12-22 DIAGNOSIS — K640 First degree hemorrhoids: Secondary | ICD-10-CM | POA: Insufficient documentation

## 2019-12-22 DIAGNOSIS — Z86010 Personal history of colonic polyps: Secondary | ICD-10-CM | POA: Diagnosis not present

## 2019-12-22 DIAGNOSIS — Z6841 Body Mass Index (BMI) 40.0 and over, adult: Secondary | ICD-10-CM | POA: Insufficient documentation

## 2019-12-22 DIAGNOSIS — Z7982 Long term (current) use of aspirin: Secondary | ICD-10-CM | POA: Insufficient documentation

## 2019-12-22 DIAGNOSIS — Z9049 Acquired absence of other specified parts of digestive tract: Secondary | ICD-10-CM | POA: Diagnosis not present

## 2019-12-22 HISTORY — PX: COLONOSCOPY WITH PROPOFOL: SHX5780

## 2019-12-22 HISTORY — DX: Cardiac arrhythmia, unspecified: I49.9

## 2019-12-22 SURGERY — COLONOSCOPY WITH PROPOFOL
Anesthesia: General

## 2019-12-22 MED ORDER — PROPOFOL 500 MG/50ML IV EMUL
INTRAVENOUS | Status: DC | PRN
  Administered 2019-12-22: 150 ug/kg/min via INTRAVENOUS

## 2019-12-22 MED ORDER — PROPOFOL 500 MG/50ML IV EMUL
INTRAVENOUS | Status: AC
  Filled 2019-12-22: qty 50

## 2019-12-22 MED ORDER — EPHEDRINE SULFATE 50 MG/ML IJ SOLN
INTRAMUSCULAR | Status: AC
  Filled 2019-12-22: qty 1

## 2019-12-22 MED ORDER — SODIUM CHLORIDE 0.9 % IV SOLN
INTRAVENOUS | Status: DC
  Administered 2019-12-22: 1000 mL via INTRAVENOUS

## 2019-12-22 MED ORDER — LIDOCAINE HCL (CARDIAC) PF 100 MG/5ML IV SOSY
PREFILLED_SYRINGE | INTRAVENOUS | Status: DC | PRN
  Administered 2019-12-22: 40 mg via INTRAVENOUS

## 2019-12-22 MED ORDER — PROPOFOL 10 MG/ML IV BOLUS
INTRAVENOUS | Status: DC | PRN
  Administered 2019-12-22: 90 mg via INTRAVENOUS

## 2019-12-22 NOTE — H&P (Signed)
Outpatient short stay form Pre-procedure 12/22/2019 11:04 AM Jessica Dalton, M.D.  Primary Physician: Dion Body, M.D.  Reason for visit:  Personal hx of adenomatous colon polyps - multiple - colonoscopy 09/08/2017.  History of present illness:                            Patient presents for colonoscopy for a personal hx of colon polyps. The patient denies abdominal pain, abnormal weight loss or rectal bleeding.      Current Facility-Administered Medications:  .  0.9 %  sodium chloride infusion, , Intravenous, Continuous, Byrnett, Forest Gleason, MD, Stopped at 12/22/19 1101  Medications Prior to Admission  Medication Sig Dispense Refill Last Dose  . acetaminophen (TYLENOL) 500 MG tablet Take 1,500 mg by mouth daily as needed for mild pain.   Past Week at Unknown time  . apixaban (ELIQUIS) 5 MG TABS tablet Take 5 mg by mouth 2 (two) times daily.   Past Week at 12/19/19  . Calcium Carbonate-Vitamin D (OYSTER SHELL CALCIUM/D) 500-400 MG-UNIT TABS Take 1 tablet by mouth daily.    Past Week at Unknown time  . cyclobenzaprine (FLEXERIL) 5 MG tablet Take 5 mg by mouth 3 (three) times daily as needed.   Past Week at Unknown time  . diltiazem (CARDIZEM CD) 120 MG 24 hr capsule Take 1 capsule (120 mg total) by mouth daily. 30 capsule 0 12/22/2019 at 0700  . ibuprofen (ADVIL,MOTRIN) 200 MG tablet Take 400 mg by mouth every 6 (six) hours as needed for headache or moderate pain.   Past Week at Unknown time  . umeclidinium-vilanterol (ANORO ELLIPTA) 62.5-25 MCG/INH AEPB INHALE ONE PUFF BY MOUTH ONCE DAILY 60 each 5 12/22/2019 at 0700  . Vitamin D, Ergocalciferol, (DRISDOL) 1.25 MG (50000 UT) CAPS capsule Take 50,000 Units by mouth once a week.   Past Week at Unknown time  . albuterol (PROVENTIL HFA;VENTOLIN HFA) 108 (90 Base) MCG/ACT inhaler Inhale 2 puffs into the lungs every 6 (six) hours as needed for wheezing or shortness of breath. (Patient not taking: Reported on 02/03/2019) 1 Inhaler 1   .  aspirin EC 81 MG EC tablet Take 1 tablet (81 mg total) by mouth daily.     [name] docusate sodium (COLACE) 100 MG capsule Take 1 capsule (100 mg total) by mouth daily as needed for mild constipation. (Patient not taking: Reported on 02/03/2019) 60 capsule 3   . furosemide (LASIX) 20 MG tablet Take 1 tablet (20 mg total) by mouth 2 (two) times daily. (Patient not taking: Reported on 02/03/2019) 60 tablet 0   . gabapentin (NEURONTIN) 300 MG capsule Take 600 mg by mouth 2 (two) times daily.     [name] nystatin (MYCOSTATIN/NYSTOP) powder Apply topically 3 (three) times daily. (Patient not taking: Reported on 02/03/2019) 60 g 0   . polyethylene glycol (MIRALAX / GLYCOLAX) packet Take 17 g by mouth at bedtime.      . vitamin B-12 (CYANOCOBALAMIN) 1000 MCG tablet Take 1,000 mcg by mouth daily.         Allergies  Allergen Reactions  . Bismuth Subsalicylate Nausea And Vomiting     Past Medical History:  Diagnosis Date  . A-fib ([employer])   . Anemia   . Asthma   . Cancer ([place])    uterine ca  . Chronic cough   . COPD (chronic obstructive pulmonary disease) ([place])   . Dysrhythmia    ATRIAL FIB.  [name] Endometrial cancer (  [place])   . Gallstones 10/16/2016  . Hypoxemia   . Obesity   . Oxygen dependent   . Pneumonia   . Sleep apnea   . Tachycardia   . Tobacco use     Review of systems:  Otherwise negative.    Physical Exam  Gen: Alert, oriented. Appears stated age.  HEENT: [place]/AT. PERRLA. Lungs: CTA, no wheezes. CV: RR nl S1, S2. Abd: soft, benign, no masses. BS+ Ext: No edema. Pulses 2+    Planned procedures: Proceed with colonoscopy. The patient understands the nature of the planned procedure, indications, risks, alternatives and potential complications including but not limited to bleeding, infection, perforation, damage to internal organs and possible oversedation/side effects from anesthesia. The patient agrees and gives consent to proceed.  Please refer to procedure notes for findings,  recommendations and patient disposition/instructions.     Ashan Cueva K. Alice Dalton, M.D. Gastroenterology 12/22/2019  11:04 AM

## 2019-12-22 NOTE — Interval H&P Note (Signed)
History and Physical Interval Note:  12/22/2019 11:05 AM  Jessica Dalton  has presented today for surgery, with the diagnosis of HX POLYPS.  The various methods of treatment have been discussed with the patient and family. After consideration of risks, benefits and other options for treatment, the patient has consented to  Procedure(s): COLONOSCOPY WITH PROPOFOL (N/A) as a surgical intervention.  The patient's history has been reviewed, patient examined, no change in status, stable for surgery.  I have reviewed the patient's chart and labs.  Questions were answered to the patient's satisfaction.     [place], [place]

## 2019-12-22 NOTE — Anesthesia Preprocedure Evaluation (Signed)
Anesthesia Evaluation  Patient identified by MRN, date of birth, ID band Patient awake    Reviewed: Allergy & Precautions, H&P , NPO status , Patient's Chart, lab work & pertinent test results, reviewed documented beta blocker date and time   Airway Mallampati: II   Neck ROM: full    Dental  (+) Poor Dentition   Pulmonary neg shortness of breath, asthma , sleep apnea and Continuous Positive Airway Pressure Ventilation , pneumonia, resolved, COPD,  COPD inhaler, Current SmokerPatient did not abstain from smoking.,    Pulmonary exam normal        Cardiovascular Exercise Tolerance: Poor + dysrhythmias Atrial Fibrillation  Rhythm:regular Rate:Normal     Neuro/Psych negative neurological ROS  negative psych ROS   GI/Hepatic negative GI ROS, Neg liver ROS,   Endo/Other  negative endocrine ROS  Renal/GU negative Renal ROS  negative genitourinary   Musculoskeletal   Abdominal   Peds  Hematology  (+) Blood dyscrasia, anemia ,   Anesthesia Other Findings Past Medical History: No date: A-fib ([facility]) No date: Anemia No date: Asthma No date: Cancer ([place])     Comment:  uterine ca No date: Chronic cough No date: COPD (chronic obstructive pulmonary disease) ([facility]) No date: Dysrhythmia     Comment:  ATRIAL FIB. No date: Endometrial cancer ([place]) 10/16/2016: Gallstones No date: Hypoxemia No date: Obesity No date: Oxygen dependent No date: Pneumonia No date: Sleep apnea No date: Tachycardia No date: Tobacco use Past Surgical History: No date: ABDOMINAL HYSTERECTOMY 2015: CATARACT EXTRACTION, BILATERAL 01/23/2017: CHOLECYSTECTOMY; N/A     Comment:  Procedure: LAPAROSCOPIC CHOLECYSTECTOMY WITH               INTRAOPERATIVE CHOLANGIOGRAM;  Surgeon: Robert Bellow, MD;  Location: [facility] ORS;  Service: General;                Laterality: N/A; 09/08/2017: COLONOSCOPY WITH PROPOFOL; N/A     Comment:  Procedure:  COLONOSCOPY WITH PROPOFOL;  Surgeon: Manya Silvas, MD;  Location: [facility] ENDOSCOPY;  Service:               Endoscopy;  Laterality: N/A; 09/08/2017: ESOPHAGOGASTRODUODENOSCOPY (EGD) WITH PROPOFOL; N/A     Comment:  Procedure: ESOPHAGOGASTRODUODENOSCOPY (EGD) WITH               PROPOFOL;  Surgeon: Manya Silvas, MD;  Location:               [facility] ENDOSCOPY;  Service: Endoscopy;  Laterality: N/A; No date: EYE SURGERY 10/16/2016: PELVIC LYMPH NODE DISSECTION; N/A     Comment:  Procedure: PELVIC LYMPH NODE DISSECTION;  Surgeon:               Gillis Ends, MD;  Location: [facility] ORS;                Service: Gynecology;  Laterality: N/A; 10/16/2016: ROBOTIC ASSISTED TOTAL HYSTERECTOMY WITH BILATERAL  SALPINGO OOPHERECTOMY; Bilateral     Comment:  Procedure: ROBOTIC ASSISTED TOTAL HYSTERECTOMY WITH               BILATERAL SALPINGO OOPHORECTOMY;  Surgeon: Gillis Ends, MD;  Location: [facility] ORS;  Service:  Gynecology;  Laterality: Bilateral; 10/16/2016: SENTINEL NODE BIOPSY; N/A     Comment:  Procedure: SENTINEL NODE INJECTION;  Surgeon: Gillis Ends, MD;  Location: [facility] ORS;  Service:               Gynecology;  Laterality: N/A; No date: TUBAL LIGATION   Reproductive/Obstetrics negative OB ROS                             Anesthesia Physical Anesthesia Plan  ASA: III  Anesthesia Plan: General   Post-op Pain Management:    Induction:   PONV Risk Score and Plan:   Airway Management Planned:   Additional Equipment:   Intra-op Plan:   Post-operative Plan:   Informed Consent: I have reviewed the patients History and Physical, chart, labs and discussed the procedure including the risks, benefits and alternatives for the proposed anesthesia with the patient or authorized representative who has indicated his/her understanding and acceptance.     Dental Advisory Given  Plan  Discussed with: CRNA  Anesthesia Plan Comments:         Anesthesia Quick Evaluation

## 2019-12-22 NOTE — Anesthesia Postprocedure Evaluation (Signed)
Anesthesia Post Note  Patient: Jessica Dalton  Procedure(s) Performed: COLONOSCOPY WITH PROPOFOL (N/A )  Patient location during evaluation: PACU Anesthesia Type: General Level of consciousness: awake and alert Pain management: pain level controlled Vital Signs Assessment: post-procedure vital signs reviewed and stable Respiratory status: spontaneous breathing, nonlabored ventilation, respiratory function stable and patient connected to nasal cannula oxygen Cardiovascular status: blood pressure returned to baseline and stable Postop Assessment: no apparent nausea or vomiting Anesthetic complications: no     Last Vitals:  Vitals:   12/22/19 1111 12/22/19 1121  BP: 134/63 (!) 96/57  Pulse: 80 77  Resp: 14 17  Temp:    SpO2: 97% 96%    Last Pain:  Vitals:   12/22/19 1121  TempSrc:   PainSc: 0-No pain                 Molli Barrows

## 2019-12-22 NOTE — Anesthesia Procedure Notes (Signed)
Date/Time: 12/22/2019 10:28 AM Performed by: Doreen Salvage, CRNA Pre-anesthesia Checklist: Patient identified, Emergency Drugs available, Suction available and Patient being monitored Patient Re-evaluated:Patient Re-evaluated prior to induction Oxygen Delivery Method: Nasal cannula Induction Type: IV induction Dental Injury: Teeth and Oropharynx as per pre-operative assessment  Comments: Nasal cannula with etCO2 monitoring

## 2019-12-22 NOTE — Transfer of Care (Signed)
Immediate Anesthesia Transfer of Care Note  Patient: Jessica Dalton  Procedure(s) Performed: Procedure(s): COLONOSCOPY WITH PROPOFOL (N/A)  Patient Location: PACU and Endoscopy Unit  Anesthesia Type:General  Level of Consciousness: sedated  Airway & Oxygen Therapy: Patient Spontanous Breathing and Patient connected to nasal cannula oxygen  Post-op Assessment: Report given to RN and Post -op Vital signs reviewed and stable  Post vital signs: Reviewed and stable  Last Vitals:  Vitals:   12/22/19 0923 12/22/19 1101  BP: 133/85 (!) 111/57  Pulse: 93 79  Resp: 20 19  Temp: (!) 36.1 C 36.6 C  SpO2: [id number] [id number]    Complications: No apparent anesthesia complications

## 2019-12-22 NOTE — Op Note (Signed)
[facility] Gastroenterology Patient Name: Jalayiah Einspahr Procedure Date: 12/22/2019 10:26 AM MRN: [id number] Account #: [id number] Date of Birth: 31-Jul-1951 Admit Type: Outpatient Age: 69 Room: [facility] ENDO ROOM 3 Gender: Female Note Status: Finalized Procedure:             Colonoscopy Indications:           High risk colon cancer surveillance: Personal history                         of colonic polyps Providers:             Benay Pike. Alice Reichert MD, MD Referring MD:          Dion Body (Referring MD) Medicines:             Propofol per Anesthesia Complications:         No immediate complications. Procedure:             Pre-Anesthesia Assessment:                        - The risks and benefits of the procedure and the                         sedation options and risks were discussed with the                         patient. All questions were answered and informed                         consent was obtained.                        - Patient identification and proposed procedure were                         verified prior to the procedure by the nurse. The                         procedure was verified in the procedure room.                        - ASA Grade Assessment: III - A patient with severe                         systemic disease.                        - After reviewing the risks and benefits, the patient                         was deemed in satisfactory condition to undergo the                         procedure.                        After obtaining informed consent, the colonoscope was                         passed under direct vision. Throughout the procedure,  the patient's blood pressure, pulse, and oxygen                         saturations were monitored continuously. The                         Colonoscope was introduced through the anus and                         advanced to the the cecum, identified by appendiceal                          orifice and ileocecal valve. The colonoscopy was                         performed without difficulty. The patient tolerated                         the procedure well. The quality of the bowel                         preparation was good. The ileocecal valve, appendiceal                         orifice, and rectum were photographed. Findings:      The perianal and digital rectal examinations were normal. Pertinent       negatives include normal sphincter tone and no palpable rectal lesions.      A 4 mm polyp was found in the hepatic flexure. The polyp was sessile.       The polyp was removed with a jumbo cold forceps. Resection and retrieval       were complete.      Two sessile polyps were found in the rectum. The polyps were 3 to 4 mm       in size. These polyps were removed with a jumbo cold forceps. Resection       and retrieval were complete.      Two semi-pedunculated polyps were found in the distal rectum. The polyps       were 5 to 7 mm in size. These polyps were removed with a hot snare.       Resection and retrieval were complete.      Non-bleeding internal hemorrhoids were found during retroflexion. The       hemorrhoids were Grade I (internal hemorrhoids that do not prolapse).      The perianal and digital rectal examinations were normal. Pertinent       negatives include normal sphincter tone and no palpable rectal lesions.      Non-bleeding internal hemorrhoids were found during retroflexion. The       hemorrhoids were Grade I (internal hemorrhoids that do not prolapse).      The exam was otherwise without abnormality. Impression:            - One 4 mm polyp at the hepatic flexure, removed with                         a jumbo cold forceps. Resected and retrieved.                        -  Two 3 to 4 mm polyps in the rectum, removed with a                         jumbo cold forceps. Resected and retrieved.                        - Two 5 to 7 mm polyps  in the distal rectum, removed                         with a hot snare. Resected and retrieved.                        - Non-bleeding internal hemorrhoids.                        - Non-bleeding internal hemorrhoids.                        - The examination was otherwise normal. Recommendation:        - Patient has a contact number available for                         emergencies. The signs and symptoms of potential                         delayed complications were discussed with the patient.                         Return to normal activities tomorrow. Written                         discharge instructions were provided to the patient.                        - Resume previous diet.                        - Continue present medications.                        - Await pathology results.                        - Repeat colonoscopy is recommended for surveillance.                         The colonoscopy date will be determined after                         pathology results from today's exam become available                         for review.                        - Return to GI office PRN.                        - The findings and recommendations were discussed with  the patient's primary physician. Procedure Code(s):     --- Professional ---                        [phone], Colonoscopy, flexible; with removal of                         tumor(s), polyp(s), or other lesion(s) by snare                         technique                        45380, 39, Colonoscopy, flexible; with biopsy, single                         or multiple Diagnosis Code(s):     --- Professional ---                        K64.0, First degree hemorrhoids                        K62.1, Rectal polyp                        K63.5, Polyp of colon                        Z86.010, Personal history of colonic polyps CPT copyright 2019 [employer]. All rights reserved. The codes  documented in this report are preliminary and upon coder review may  be revised to meet current compliance requirements. Efrain Sella MD, MD 12/22/2019 11:00:54 AM This report has been signed electronically. Number of Addenda: 0 Note Initiated On: 12/22/2019 10:26 AM Scope Withdrawal Time: 0 hours 12 minutes 40 seconds  Total Procedure Duration: 0 hours 15 minutes 49 seconds  Estimated Blood Loss:  Estimated blood loss: none.      [facility]

## 2019-12-23 LAB — SURGICAL PATHOLOGY

## 2020-01-14 DIAGNOSIS — G8929 Other chronic pain: Secondary | ICD-10-CM | POA: Insufficient documentation

## 2020-01-31 ENCOUNTER — Telehealth: Payer: Self-pay | Admitting: *Deleted

## 2020-01-31 NOTE — Telephone Encounter (Signed)
Left message for patient to notify them that it is time to schedule annual low dose lung cancer screening CT scan. Instructed patient to call back to verify information prior to the scan being scheduled.  

## 2020-02-02 ENCOUNTER — Ambulatory Visit: Payer: Medicare HMO

## 2020-02-03 ENCOUNTER — Telehealth: Payer: Self-pay | Admitting: *Deleted

## 2020-02-03 NOTE — Telephone Encounter (Signed)
(  02/03/2020) Left message for patient to notify them that it is time to schedule annual low dose lung cancer screening CT scan. Instructed patient to call back to verify information prior to the scan being scheduled [facility]

## 2020-02-09 ENCOUNTER — Telehealth: Payer: Self-pay | Admitting: *Deleted

## 2020-02-09 ENCOUNTER — Other Ambulatory Visit: Payer: Self-pay

## 2020-02-09 ENCOUNTER — Inpatient Hospital Stay: Payer: Medicare HMO | Attending: Obstetrics and Gynecology | Admitting: Obstetrics and Gynecology

## 2020-02-09 VITALS — BP 135/63 | HR 78 | Temp 96.6°F | Resp 18 | Wt 223.0 lb

## 2020-02-09 DIAGNOSIS — I2510 Atherosclerotic heart disease of native coronary artery without angina pectoris: Secondary | ICD-10-CM | POA: Insufficient documentation

## 2020-02-09 DIAGNOSIS — F17210 Nicotine dependence, cigarettes, uncomplicated: Secondary | ICD-10-CM | POA: Insufficient documentation

## 2020-02-09 DIAGNOSIS — J449 Chronic obstructive pulmonary disease, unspecified: Secondary | ICD-10-CM | POA: Diagnosis not present

## 2020-02-09 DIAGNOSIS — I4891 Unspecified atrial fibrillation: Secondary | ICD-10-CM | POA: Diagnosis not present

## 2020-02-09 DIAGNOSIS — Z90710 Acquired absence of both cervix and uterus: Secondary | ICD-10-CM | POA: Insufficient documentation

## 2020-02-09 DIAGNOSIS — Z7982 Long term (current) use of aspirin: Secondary | ICD-10-CM | POA: Diagnosis not present

## 2020-02-09 DIAGNOSIS — Z79899 Other long term (current) drug therapy: Secondary | ICD-10-CM | POA: Diagnosis not present

## 2020-02-09 DIAGNOSIS — Z7901 Long term (current) use of anticoagulants: Secondary | ICD-10-CM | POA: Diagnosis not present

## 2020-02-09 DIAGNOSIS — Z8542 Personal history of malignant neoplasm of other parts of uterus: Secondary | ICD-10-CM

## 2020-02-09 DIAGNOSIS — I700 Atherosclerosis of aorta: Secondary | ICD-10-CM | POA: Insufficient documentation

## 2020-02-09 DIAGNOSIS — C541 Malignant neoplasm of endometrium: Secondary | ICD-10-CM | POA: Insufficient documentation

## 2020-02-09 DIAGNOSIS — Z9981 Dependence on supplemental oxygen: Secondary | ICD-10-CM | POA: Diagnosis not present

## 2020-02-09 DIAGNOSIS — J90 Pleural effusion, not elsewhere classified: Secondary | ICD-10-CM | POA: Insufficient documentation

## 2020-02-09 DIAGNOSIS — Z90722 Acquired absence of ovaries, bilateral: Secondary | ICD-10-CM | POA: Diagnosis not present

## 2020-02-09 NOTE — Progress Notes (Signed)
Gynecologic Oncology Interval Visit   Referring Provider: Boykin Nearing, MD [address] [facility] [place], [place] [address] [phone]  Chief Concern: surveillance visit for endometrial cancer  Subjective:  Jessica Dalton is a 69 y.o. female who is seen in consultation from Dr. Ouida Sills for grade 1 endometrial cancer, who returns to clinic for surveillance today.   She saw Dr. Leonides Schanz 08/05/2019 with negative exam. She continued to have abdominal complaints at that time and she was referred to GI. In the interim she has had a colonoscopy on 12/22/2019.   Last mammogram 02/17/2019 was reported as bi-rads category 1: negative. She continues to be followed by the lung cancer screening program and is due for annual screening.   Today she reports weight is generally stable to slightly decreased. Has noticed decreased appetite intermittently. She continues to smoke and has ongoing IBS and chronic neuropathy.  02/04/2019 IMPRESSION: 1. Lung-RADS 2, benign appearance or behavior. Continue annual screening with low-dose chest CT without contrast in 12 months. 2. Three-vessel coronary atherosclerosis.   Colonoscopy 12/22/2019 Findings: - One 4 mm polyp at the hepatic flexure, removed with a jumbo cold forceps. Resected and retrieved. - Two 3 to 4 mm polyps in the rectum, removed with a jumbo cold forceps. Resected and retrieved. - Two 5 to 7 mm polyps in the distal rectum, removed with a hot snare. Resected and retrieved. - Non-bleeding internal hemorrhoids. - Non-bleeding internal hemorrhoids. - The examination was otherwise normal.  DIAGNOSIS:  A. COLON POLYP, HEPATIC FLEXURE; COLD BIOPSY:  - TUBULAR ADENOMA.  - NEGATIVE FOR HIGH-GRADE DYSPLASIA AND MALIGNANCY.   B. RECTUM POLYPS X4; HOT SNARE (2) AND COLD BIOPSY (2):  - TRADITIONAL SERRATED ADENOMA, TWO FRAGMENTS.  - HYPERPLASTIC POLYP, TWO FRAGMENTS.  - TUBULAR ADENOMA, ONE FRAGMENT.  - NEGATIVE FOR  HIGH-GRADE DYSPLASIA AND MALIGNANCY   Gynecologic Oncology History She was referred to Dr. Ouida Sills by Dr. Netty Starring for PMB.  grade 1 endometrial cancer. EMBx and Pap performed on 09/09/2016. Uterus sounded to 10 cm. 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 Pap NILM.   Dr. Ouida Sills also ordered a CT scan to evaluate upper abdominal pain symptoms.    On 10/16/2016 she underwent RA-TLH BSO and SLN injection, mapping and biopsy with Dr. Theora Gianotti. Her intraoperative and postoperative course was significant for pulmonary issues. She had desaturation due to chronic pulmonary disease from smoking.   09/24/2016 preoperative CT scan A/P revealed the following: IMPRESSION: 1. Single borderline enlarged portal hepatic lymph node. No additional findings suspicious for or suggestive of metastatic disease. Further imaging with PET-CT may be helpful to assess for hyper metabolism within this lymph node. 2. Left adrenal nodule, indeterminate. This could be better assessed with adrenal protocol MRI or PET-CT. 3. Thickening of the endometrium which in a postmenopausal female is considered abnormal and consistent with the clinical history of endometrial sarcoma. Stones identified within the gallbladder are noted measuring up to 3 cm.   I reviewed the findings preoperatively and spoke with Dr. Bary Castilla to see if he could be available for cholecystectomy as she was symptomatic with the gallstones. Unfortunately he was not available but recommended postoperative referral.  Preop CXR:  IMPRESSION: Hyperinflation and mild diffuse interstitial prominence consistent with the history of reactive airway disease. There is no pneumonia, CHF, nor other acute cardiopulmonary abnormality.  Aortic atherosclerosis.  Pathology noted as below:  Cytology: Negative  DIAGNOSIS:  A. SENTINEL LYMPH NODE, LEFT; EXCISION:  - NO TUMOR SEEN IN  ONE LYMPH NODE (0/1).   B. SENTINEL LYMPH NODE, LEFT;  EXCISION:  - NO TUMOR SEEN IN TWO LYMPH NODES (0/2).   C. UTERUS WITH CERVIX, BILATERAL FALLOPIAN TUBES AND OVARIES;  HYSTERECTOMY WITH BILATERAL SALPINGO-OOPHORECTOMY:  - ENDOMETRIOID ADENOCARCINOMA FIGO I.  - SURFACE DEGENERATIVE PAPILLARY FEATURES.  - MYOMETRIAL INVASION IS NOT IDENTIFIED.  - CHRONIC CERVICITIS.  - STROMAL HYPERPLASIA OF BILATERAL OVARIES.  - NO PATHOLOGIC CHANGE, BILATERAL FALLOPIAN TUBES.  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    ENDOMETRIUM: Hysterectomy, With or Without Other Organs or Tissues  Specimens InvolvedA: Sentinel lymph node, left  B: Sentinel lymph node, right proximal obturator  C: Uterus with cervix, bilateral tubes and ovaries   Endometrium, Hysterectomy, With or Without Other Organs or Tissues  Cancer Case Summary  Specimen: Uterine corpus  SPECIMEN  Procedure  Simple hysterectomy  Additional Procedures:  Bilateral salpingo-oophorectomy  Lymph Node Sampling:   Performed  Pelvic lymph nodes  Specimen Integrity: Intact hysterectomy specimen  TUMOR  Histologic Type:  Endometrioid adenocarcinoma, not otherwise  characterized  Histologic Grade:  FIGO grade 1  EXTENT  Tumor Size:  Greatest dimension (cm)  4.2cm  Myometrial Invasion:   Not identified  Involvement of Cervix:  Not involved  Other Organs Submitted: Right ovary  Not involved  Left ovary  Not involved  Right fallopian tube  Not involved  Left fallopian tube  Not involved  ACCESSORY FINDINGS  Lymph-Vascular Invasion: Not identified  STAGE (pTNM [FIGO])  Primary Tumor (pT):  pT1a [IA]: Tumor limited to endometrium or invades less than one-half  of the myometrium  Regional Lymph Nodes (pN)  pN0: No regional lymph node metastasis  Pelvic Lymph Nodes: Number of Lymph Nodes Examined:  Specify  3  Number of Lymph Nodes Involved:  Specify  0  Para-aortic Lymph Nodes: No para-aortic nodes submitted or found  Distant  Metastasis (pM): Not applicable   Since surgery she was seen by Dr. Bary Castilla on 11/06/2016 with the plan to proceed with Laparoscopic Cholecystectomy with Intraoperative Cholangiogram. She had a consult with Dr. Rogue Bussing for erythocytosis - work up still ongoing. She was admitted on 11/11/2016 throught 11/15/2016 with sepsis, pneumonia, COPD exacerbation,  dyspnea, and new onset atrial fibrillation. She is being treated by Dr. Netty Starring and Dr. Clayborn Bigness.  Her cholecystectomy was canceled due to her other comorbidities.   In 2/18 Dr Bary Castilla performed LS cholecystectomy without incident.    Low Dose CT Lung Cancer Screening Follow-up done on 12/31/2017 - Left lower lobe pulmonary nodule is stable with volume derived diameter of 7.91m. Lung-Rads 2. Repeat in one year. Also noted: interval progression of atelectasis and/or scarring of the peripheral lingula, emphysema, and aortic atherosclerosis.  CT C/A/P 12/31/2017 IMPRESSION: 1. Stable exam.  No new or progressive interval findings. 2. Small left lower lobe pulmonary nodule unchanged in the 1.5 year interval since prior study consistent with benign etiology. 3. No change 15 mm porta hepatis lymph node identified on prior study. 4. Stable 14 mm left adrenal nodule, likely adenoma. 5.  Aortic Atherosclerois (ICD10-170.0)  IMPRESSION from CT Chest LCS 1. Left lower lobe pulmonary nodule is stable by CT screening criteria with volume derived equivalent diameter of 7.9 mm today. Lung-RADS 2, benign appearance or behavior. Continue annual screening with low-dose chest CT without contrast in 12 months.  2. Interval progression of atelectasis and/or scarring in the peripheral lingula. 3.  Emphysema. (ICD10-J43.9) 4.  Aortic Atherosclerois (ICD10-170.0)  PET 03/20/2018  IMPRESSION: 1. No current abnormal hypermetabolic activity to suggest active malignancy. The mildly enlarged porta hepatis lymph nodes are not hypermetabolic and likely reactive. 2. There  are some scattered air-fluid levels in nondilated small bowel loops which could reflect a mild enteritis. No overt bowel wall thickening is observed. 3. Chronically stable 7 by 5 mm left lower lobe lung nodule, not appreciably hypermetabolic but below sensitive PET-CT size thresholds. No change from earliest available comparison of 09/24/2016. 4. Other imaging findings of potential clinical significance: Aortic Atherosclerosis (ICD10-I70.0) and Emphysema (ICD10-J43.9). Small left adrenal adenoma. Lower lumbar facet arthropathy. Stable lucency along the left acetabular roof, probably a geode or degenerative subcortical cystic lesion, not hypermetabolic.  She has been NED since her endometrial cancer surgery.   Seen in clinic on 03/11/2018 and had a negative exam. She had abdominal complaints which prompted imaging. She was supposed to see Dr. Ouida Sills but did not have that appointment. She would like to see one of his partners. She feels more comfortable with a female provider.    Other issues H/o Nystatin use for dermatologic candidiasis with improvement.   H/o colonic polyps 09/08/2017 colonoscopy - multiple polyps with tubular adenoma, sessile serrated adenomas and hyperplastic polyps. Hiatal hernia, gastritis, esophagitis; repeat 12/2019 findings noted above.  09/08/2017 endoscopy - esophagitis and gastritis  GI recommended repeat colonoscopy in 2020 and genetic testing.  Per patient she was treated for helicobacter but has not had follow up. Per GI note she does not meet criteria for IBS.   Genetic testing: Testing did not reveal a pathogenic mutation in any of the genes analyzed. A copy of the genetic test report will be scanned into Epic under the Media tab.  The genes analyzed were the 83 genes on [facility]'s Multi-Cancer panel (ALK, APC, ATM, AXIN2, BAP1, BARD1, BLM, BMPR1A, BRCA1, BRCA2, BRIP1, CASR, CDC73, CDH1, CDK4, CDKN1B, CDKN1C, CDKN2A, CEBPA, CHEK2, CTNNA1, DICER1, DIS3L2, EGFR,  EPCAM, FH, FLCN, GATA2, GPC3, GREM1, HOXB13, HRAS, KIT, MAX, MEN1, MET, MITF, MLH1, MSH2, MSH3, MSH6, MUTYH, NBN, NF1, NF2, NTHL1, PALB2, PDGFRA, PHOX2B, PMS2, POLD1, POLE, POT1, PRKAR1A, PTCH1, PTEN, RAD50, RAD51C, RAD51D, RB1, RECQL4, RET, RUNX1, SDHA, SDHAF2, SDHB, SDHC, SDHD, SMAD4, SMARCA4, SMARCB1, SMARCE1, STK11, SUFU, TERC, TERT, TMEM127, TP53, TSC1, TSC2, VHL, WRN, WT1).  A Variant of Uncertain Significance was detected: RECQL4 c.[id number]>T (p.Arg562Trp). This is still considered a normal result.    Problem List: Patient Active Problem List   Diagnosis Date Noted  . Personal history of tobacco use, presenting hazards to health 09/18/2017  . History of endometrial cancer 09/02/2017  . Pleural effusion 01/14/2017  . Constipation 12/04/2016  . Atrial fibrillation with RVR ([place]) 11/11/2016  . Erythrocytosis 11/08/2016  . Cigarette smoker   . Gallstones 10/16/2016  . Endometrial cancer determined by uterine biopsy ([place]) 10/16/2016  . Endometrial cancer ([place]) 09/18/2016    Past Medical History: Past Medical History:  Diagnosis Date  . A-fib ([place])   . Anemia   . Asthma   . Cancer ([place])    uterine ca  . Chronic cough   . COPD (chronic obstructive pulmonary disease) ([place])   . Dysrhythmia    ATRIAL FIB.  [name] Endometrial cancer ([place])   . Gallstones 10/16/2016  . Hypoxemia   . Obesity   . Oxygen dependent   . Pneumonia   . Sleep apnea   . Tachycardia   . Tobacco use     Past Surgical History: Past Surgical History:  Procedure Laterality Date  . ABDOMINAL HYSTERECTOMY    .  CATARACT EXTRACTION, BILATERAL  2015  . CHOLECYSTECTOMY N/A 01/23/2017   Procedure: LAPAROSCOPIC CHOLECYSTECTOMY WITH INTRAOPERATIVE CHOLANGIOGRAM;  Surgeon: Robert Bellow, MD;  Location: [facility] ORS;  Service: General;  Laterality: N/A;  . COLONOSCOPY WITH PROPOFOL N/A 09/08/2017   Procedure: COLONOSCOPY WITH PROPOFOL;  Surgeon: Manya Silvas, MD;  [facility] ENDOSCOPY;  Service: Endoscopy;   Laterality: N/A;  . COLONOSCOPY WITH PROPOFOL N/A 12/22/2019   Procedure: COLONOSCOPY WITH PROPOFOL;  Surgeon: Toledo, Benay Pike, MD;  Location: [facility] ENDOSCOPY;  Service: Gastroenterology;  Laterality: N/A;  . ESOPHAGOGASTRODUODENOSCOPY (EGD) WITH PROPOFOL N/A 09/08/2017   Procedure: ESOPHAGOGASTRODUODENOSCOPY (EGD) WITH PROPOFOL;  Surgeon: Manya Silvas, MD;  Location: [facility] ENDOSCOPY;  Service: Endoscopy;  Laterality: N/A;  . EYE SURGERY    . PELVIC LYMPH NODE DISSECTION N/A 10/16/2016   Procedure: PELVIC LYMPH NODE DISSECTION;  Surgeon: Gillis Ends, MD;  Location: [facility] ORS;  Service: Gynecology;  Laterality: N/A;  . ROBOTIC ASSISTED TOTAL HYSTERECTOMY WITH BILATERAL SALPINGO OOPHERECTOMY Bilateral 10/16/2016   Procedure: ROBOTIC ASSISTED TOTAL HYSTERECTOMY WITH BILATERAL SALPINGO OOPHORECTOMY;  Surgeon: Gillis Ends, MD;  Location: [facility] ORS;  Service: Gynecology;  Laterality: Bilateral;  . SENTINEL NODE BIOPSY N/A 10/16/2016   Procedure: SENTINEL NODE INJECTION;  Surgeon: Gillis Ends, MD;  Location: [facility] ORS;  Service: Gynecology;  Laterality: N/A;  . TUBAL LIGATION      Past Gynecologic History:  Menarche: unknown Menstrual details: postmenopausal Last Menstrual Period: 15 years ago History of OCP/HRT use: None History of Abnormal pap: no, benign cellular changes Last pap: as per HPI   OB History:  OB History  Gravida Para Term Preterm AB Living  _0 SAB TAB Ectopic Multiple Live Births               # Outcome Date GA Lbr Len/2nd Weight Sex Delivery Anes PTL Lv  3 Term           2 Term           1 Term             Obstetric Comments  1st Menstrual Cycle:  ?   1st Pregnancy:  19      Family History: Family History  Problem Relation Age of Onset  . Kidney cancer Mother 93       deceased 51  . Colon cancer Father 24       deceased 2  . Melanoma Sister 4       deceased 59  . Uterine cancer Sister 32  . Cancer Paternal  Grandmother        unk. age or type  . Cancer Other        maternal and paternal aunts/uncles with cancer; no specifics available    Social History: Social History   Socioeconomic History  . Marital status: Married    Spouse name: Not on file  . Number of children: Not on file  . Years of education: Not on file  . Highest education level: Not on file  Occupational History  . Not on file  Tobacco Use  . Smoking status: Current Every Day Smoker    Packs/day: 1.50    Years: 53.00    Pack years: 79.50  . Smokeless tobacco: Never Used  . Tobacco comment: 37 pk yr - 1.5 ppd; 42 years; started age 56  Substance and Sexual Activity  . Alcohol use: No  . Drug use: No  .  Sexual activity: Not on file  Other Topics Concern  . Not on file  Social History Narrative  . Not on file   Social Determinants of Health   Financial Resource Strain:   . Difficulty of Paying Living Expenses: Not on file  Food Insecurity:   . Worried About Charity fundraiser in the Last Year: Not on file  . Ran Out of Food in the Last Year: Not on file  Transportation Needs:   . Lack of Transportation (Medical): Not on file  . Lack of Transportation (Non-Medical): Not on file  Physical Activity:   . Days of Exercise per Week: Not on file  . Minutes of Exercise per Session: Not on file  Stress:   . Feeling of Stress : Not on file  Social Connections:   . Frequency of Communication with Friends and Family: Not on file  . Frequency of Social Gatherings with Friends and Family: Not on file  . Attends Religious Services: Not on file  . Active Member of Clubs or Organizations: Not on file  . Attends Archivist Meetings: Not on file  . Marital Status: Not on file  Intimate Partner Violence:   . Fear of Current or Ex-Partner: Not on file  . Emotionally Abused: Not on file  . Physically Abused: Not on file  . Sexually Abused: Not on file    Allergies: Allergies  Allergen Reactions  . Bismuth  Subsalicylate Nausea And Vomiting    Current Medications: Current Outpatient Medications  Medication Sig Dispense Refill  . acetaminophen (TYLENOL) 500 MG tablet Take 1,500 mg by mouth daily as needed for mild pain.    [name] apixaban (ELIQUIS) 5 MG TABS tablet Take 5 mg by mouth 2 (two) times daily.    [name] aspirin EC 81 MG EC tablet Take 1 tablet (81 mg total) by mouth daily.    [name] diltiazem (CARDIZEM CD) 120 MG 24 hr capsule Take 1 capsule (120 mg total) by mouth daily. 30 capsule 0  . DULoxetine (CYMBALTA) 30 MG capsule Take by mouth.    . furosemide (LASIX) 20 MG tablet Take 1 tablet (20 mg total) by mouth 2 (two) times daily. 60 tablet 0  . linaclotide (LINZESS) 72 MCG capsule Take by mouth.    . umeclidinium-vilanterol (ANORO ELLIPTA) 62.5-25 MCG/INH AEPB INHALE ONE PUFF BY MOUTH ONCE DAILY 60 each 5  . vitamin B-12 (CYANOCOBALAMIN) 1000 MCG tablet Take 1,000 mcg by mouth daily.     [name] albuterol (PROVENTIL HFA;VENTOLIN HFA) 108 (90 Base) MCG/ACT inhaler Inhale 2 puffs into the lungs every 6 (six) hours as needed for wheezing or shortness of breath. (Patient not taking: Reported on 02/03/2019) 1 Inhaler 1  . Calcium Carbonate-Vitamin D (OYSTER SHELL CALCIUM/D) 500-400 MG-UNIT TABS Take 1 tablet by mouth daily.     . cyclobenzaprine (FLEXERIL) 5 MG tablet Take 5 mg by mouth 3 (three) times daily as needed.    . docusate sodium (COLACE) 100 MG capsule Take 1 capsule (100 mg total) by mouth daily as needed for mild constipation. (Patient not taking: Reported on 02/03/2019) 60 capsule 3  . gabapentin (NEURONTIN) 300 MG capsule Take 600 mg by mouth 2 (two) times daily.    [name] ibuprofen (ADVIL,MOTRIN) 200 MG tablet Take 400 mg by mouth every 6 (six) hours as needed for headache or moderate pain.    [name] nystatin (MYCOSTATIN/NYSTOP) powder Apply topically 3 (three) times daily. (Patient not taking: Reported on 02/03/2019) 60 g 0  .  polyethylene glycol (MIRALAX / GLYCOLAX) packet Take 17 g by mouth at bedtime.      . Vitamin D, Ergocalciferol, (DRISDOL) 1.25 MG (50000 UT) CAPS capsule Take 50,000 Units by mouth once a week.     No current facility-administered medications for this visit.   Review of Systems General:  no complaints Skin: no complaints Eyes: no complaints HEENT: no complaints Breasts: no complaints Pulmonary: smoker Cardiac: no complaints Gastrointestinal: IBS & constipation; appetite changes Genitourinary/Sexual: no complaints Ob/Gyn: no complaints Musculoskeletal: no complaints Hematology: no complaints Neurologic/Psych: chronic neuropathy   Objective:  Physical Examination:  BP 135/63 (BP Location: Right Arm, Patient Position: Sitting)   Pulse 78   Temp (!) 96.6 F (35.9 C) (Tympanic)   Resp 18   Wt 223 lb (101.2 kg)   BMI 42.14 kg/m    ECOG Performance Status: 0 - Asymptomatic  GENERAL: Patient is a well appearing female in no acute distress HEENT:  PERRL, neck supple with midline trachea.  NODES:  No cervical, supraclavicular, axillary, or inguinal lymphadenopathy palpated.  LUNGS:  Clear to auscultation bilaterally.  No wheezes or rhonchi. HEART:  Regular rate and rhythm. No murmur appreciated. ABDOMEN:  Soft, nontender.  Positive, normoactive bowel sounds.  MSK:  No focal spinal tenderness to palpation. Full range of motion bilaterally in the upper extremities. EXTREMITIES:  No peripheral edema.   SKIN:  Clear with no obvious rashes or skin changes.  NEURO:  Nonfocal. Well oriented.  Appropriate affect.  Pelvic: chaperoned by nurse tech EGBUS: no lesions Cervix:  surgically absent Vagina: no lesions, no discharge or bleeding Uterus:  surgically absent Adnexa: no palpable masses Rectovaginal: confirmatory      Assessment:  Jessica Dalton is a 69 y.o. female with stage 1A, grade 1 endometrioid endometrial cancer.  Abdominal symptoms concerning may be GI related but differential also includes recurrence. Last CT scan and PET reassuring and no evidence  of malignancy. However, GI chronic symptoms and abdominal pain concerning for recurrence.   Preoperative imaging revealed single borderline enlarged portal hepatic lymph node and left adrenal nodule, stable findings on repeat CT scan and no appreciable hypermetabolic findings on PET 08/6221.  History of pneumonia with abnormal CT scan in the setting of active tobacco use, stable pulmonary nodule. 02/04/2019 CT chest reassuring.   Hyperplastic polyp  Medical co-morbidities complicating care: asthma, tobacco use, and obesity. Additional issues include recent hospitalization for sepsis, pneumonia, COPD exacerbation,  dyspnea, and atrial fibrillation, now resolved. Symptomatic cholelithiasis. Constipation.  Plan:   Problem List Items Addressed This Visit    None    Visit Diagnoses    History of uterine cancer    -  Primary   Relevant Orders   CT Abdomen Pelvis W Contrast     Plan CT A/P to assess for recurrent disease given extent of abdominal symptoms. If negative continue to alternative visit with Dr. Leonides Schanz in 6 months and return to our clinic in 12 months.   Previously we discussed endometrial cancer status she has an excellent prognosis with early stage disease. Her risk of recurrence is less than 10%. I have recommended continued close follow up with exams, including pelvic exams every 6 months for 3-5 years and then annually thereafter.   Continue to follow up CT lung cancer screening with Burgess Estelle, RN. Discussed smoking cessation again today.  Continue nystatin for dermatitis as needed.    Genetic testing negative for pathogenic mutation.   We recommended that she follow up with the GI  team for her chronic symptoms and hyperplastic polyp.   The patient's diagnosis, an outline of the further diagnostic and laboratory studies which will be required, the recommendation, and alternatives were discussed.  All questions were answered to the patient's satisfaction    I personally had  a face to face interaction and evaluated the patient jointly with the NP, Ms. Beckey Rutter.  I have reviewed her history and available records and have performed the key portions of the physical exam including  abdominal exam, pelvic exam with my findings confirming those documented above by the APP.  I have discussed the case with the APP and the patient.  I agree with the above documentation, assessment and plan which was fully formulated by me.  Counseling was completed by me.   I personally saw the patient and performed a substantive portion of this encounter in conjunction with the listed APP as documented above.   Angeles Gaetana Michaelis, MD  CC:  Boykin Nearing, MD [address] [facility] [place], [place] [address] [phone]  Dr. Dion Body

## 2020-02-09 NOTE — Telephone Encounter (Signed)
Attempted to contact to coordinate lung screening and other imaging. Left voicemail for patient to return my call.

## 2020-02-09 NOTE — Progress Notes (Signed)
Wants to know if she had a complete or partial hysterectomy. No other questions or concerns at this time

## 2020-02-09 NOTE — Patient Instructions (Signed)
Please contact Dr. Audrie Lia. [place] [facility] office to schedule appointment in 6 months. If you develop concerning symptoms in the interim, we will see you sooner.

## 2020-02-10 ENCOUNTER — Telehealth: Payer: Self-pay | Admitting: *Deleted

## 2020-02-10 NOTE — Telephone Encounter (Signed)
(  02/10/20) Pt has been notified that lung cancer screening CT scan is due currently or will be in near future. Confirmed pt is within appropriate age range, and asymptomatic. Pt denies illness that would prevent curative treatment for lung cancer if found. Verified smoking history (Current Smoker,0.75 ppd ). Pt will receive 2nd COVID VX on (02/22/20). Pt is agreeable for CT scan being scheduled, will try to coordinate for scan to be on 02/18/20; pt already has AB/Pelvis CT @ 10 am on this day.  [CT to be scheduled no sooner than 4 weeks after vx date] SRW

## 2020-02-18 ENCOUNTER — Telehealth: Payer: Self-pay | Admitting: Nurse Practitioner

## 2020-02-18 ENCOUNTER — Other Ambulatory Visit: Payer: Self-pay

## 2020-02-18 ENCOUNTER — Ambulatory Visit
Admission: RE | Admit: 2020-02-18 | Discharge: 2020-02-18 | Disposition: A | Discharge status: Home / Self Care | Payer: Medicare HMO | Source: Ambulatory Visit | Attending: Nurse Practitioner | Admitting: Nurse Practitioner

## 2020-02-18 DIAGNOSIS — Z8542 Personal history of malignant neoplasm of other parts of uterus: Secondary | ICD-10-CM

## 2020-02-18 HISTORY — DX: Essential (primary) hypertension: I10

## 2020-02-18 LAB — POCT I-STAT CREATININE: Creatinine, Ser: 0.6 mg/dL (ref 0.44–1.00)

## 2020-02-18 MED ORDER — IOHEXOL 300 MG/ML  SOLN
100.0000 mL | Freq: Once | INTRAMUSCULAR | Status: AC | PRN
  Administered 2020-02-18: 100 mL via INTRAVENOUS

## 2020-02-18 NOTE — Telephone Encounter (Signed)
Called to discuss results of ct scan. No answer. Left message to return call.

## 2020-03-16 ENCOUNTER — Telehealth: Payer: Self-pay

## 2020-03-16 NOTE — Telephone Encounter (Signed)
Patient has appts for the next 3 weeks and would like to coordinate the lung cancer screening scan after one of the appts.  Will return call to patient early next week to give her time to have all the appt details available.

## 2020-03-23 ENCOUNTER — Telehealth: Payer: Self-pay

## 2020-03-23 DIAGNOSIS — Z87891 Personal history of nicotine dependence: Secondary | ICD-10-CM

## 2020-03-23 NOTE — Telephone Encounter (Signed)
Patient has been notified that the low dose lung cancer screening CT scan is due currently or will be in near future.  Confirmed that patient is within the appropriate age range and asymptomatic, (no signs or symptoms of lung cancer).  Patient denies illness that would prevent curative treatment for lung cancer if found.  Verified smoking history (current smoker, with 54 year 1 ppd history).  Patient is agreeable for CT scan being scheduled.    Would like to have the scan scheduled on 4/20 before 11:15 which is time another appointment or on 4/27 before 10:45 which is also prior to MD appointment.

## 2020-03-28 ENCOUNTER — Telehealth: Payer: Self-pay

## 2020-03-28 NOTE — Addendum Note (Signed)
Addended by: Lieutenant Diego on: 03/28/2020 11:25 AM   Modules accepted: Orders

## 2020-03-28 NOTE — Telephone Encounter (Signed)
Smoking history: current, 80.5 pack year

## 2020-03-28 NOTE — Telephone Encounter (Signed)
Patient informed of low dose lung cancer screening CT scan appointment on 04/04/20 at [facility]

## 2020-04-01 ENCOUNTER — Telehealth: Payer: Self-pay

## 2020-04-01 NOTE — Telephone Encounter (Signed)
Message left informing patient of low dose lung cancer screening CT scan appointment on 04/04/20 @ 9:00.

## 2020-04-04 ENCOUNTER — Ambulatory Visit
Admission: RE | Admit: 2020-04-04 | Discharge: 2020-04-04 | Disposition: A | Discharge status: Home / Self Care | Payer: Medicare HMO | Source: Ambulatory Visit | Attending: Oncology | Admitting: Oncology

## 2020-04-04 ENCOUNTER — Other Ambulatory Visit: Payer: Self-pay

## 2020-04-04 DIAGNOSIS — Z87891 Personal history of nicotine dependence: Secondary | ICD-10-CM | POA: Diagnosis not present

## 2020-04-05 ENCOUNTER — Telehealth: Payer: Self-pay | Admitting: Oncology

## 2020-04-05 NOTE — Progress Notes (Signed)
Done

## 2020-04-05 NOTE — Telephone Encounter (Signed)
Re: LDCT Scan Results  Patient had routine annual low-dose CT scan yesterday which revealed Lung-RADS 4B, suspicious. A new ill-defined masslike focus of consolidation in the perihilar right lower lobe associated with narrowing of the right lower lobe bronchus and volume loss and bandlike consolidation in the medial basilar right lower lobe. Central right lower lobe malignancy not excluded. Recommend further evaluation with PET-CT and/or bronchoscopy.   PET scan ordered per report.   Patient has been evaluated for grade 1 endometrial cancer s/p RA-TLH BSO and SLN injection with mapping and biopsy.  She is currently on surveillance.  Last PET scan was in April 2019.  Faythe Casa, NP 04/05/2020 1:14 PM

## 2020-04-06 ENCOUNTER — Other Ambulatory Visit: Payer: Self-pay | Admitting: *Deleted

## 2020-04-06 ENCOUNTER — Telehealth: Payer: Self-pay | Admitting: *Deleted

## 2020-04-06 DIAGNOSIS — R918 Other nonspecific abnormal finding of lung field: Secondary | ICD-10-CM

## 2020-04-06 NOTE — Telephone Encounter (Signed)
After discussion with PCP, thoracic navigation, and pulmonary, patient contacted and reviewed CT results with recommendation for PET scan and possible bronchoscopy afterwards. Patient is in agreement with this plan. Pet has been scheduled for 04/11/20 and follow up will be scheduled shortly afterward. Pulmonary has been notified as well.   IMPRESSION: 1. Lung-RADS 4B, suspicious. New ill-defined masslike focus of consolidation in the perihilar right lower lobe associated with narrowing of the right lower lobe bronchus and volume loss and bandlike consolidation in the medial basilar right lower lobe. Central right lower lobe malignancy not excluded. Recommend further evaluation with PET-CT and/or bronchoscopy. Additional imaging evaluation or consultation with Pulmonology or Thoracic Surgery recommended. 2. Three-vessel coronary atherosclerosis. 3. Aortic Atherosclerosis (ICD10-I70.0) and Emphysema (ICD10-J43.9).  These results will be called to the ordering clinician or representative by the Radiologist Assistant, and communication documented in the PACS or [employer].

## 2020-04-11 ENCOUNTER — Other Ambulatory Visit: Payer: Self-pay

## 2020-04-11 ENCOUNTER — Ambulatory Visit
Admission: RE | Admit: 2020-04-11 | Discharge: 2020-04-11 | Disposition: A | Discharge status: Home / Self Care | Payer: Medicare HMO | Source: Ambulatory Visit | Attending: Oncology | Admitting: Oncology

## 2020-04-11 DIAGNOSIS — Z01818 Encounter for other preprocedural examination: Secondary | ICD-10-CM | POA: Insufficient documentation

## 2020-04-11 DIAGNOSIS — R918 Other nonspecific abnormal finding of lung field: Secondary | ICD-10-CM

## 2020-04-11 LAB — GLUCOSE, CAPILLARY: Glucose-Capillary: 102 mg/dL — ABNORMAL HIGH (ref 70–99)

## 2020-04-11 MED ORDER — FLUDEOXYGLUCOSE F - 18 (FDG) INJECTION
11.3000 | Freq: Once | INTRAVENOUS | Status: AC | PRN
  Administered 2020-04-11: 11:00:00 11.86 via INTRAVENOUS

## 2020-04-12 ENCOUNTER — Inpatient Hospital Stay: Payer: Medicare HMO | Attending: Internal Medicine | Admitting: Internal Medicine

## 2020-04-12 ENCOUNTER — Encounter: Payer: Self-pay | Admitting: Internal Medicine

## 2020-04-12 ENCOUNTER — Inpatient Hospital Stay: Payer: Medicare HMO

## 2020-04-12 DIAGNOSIS — R911 Solitary pulmonary nodule: Secondary | ICD-10-CM | POA: Insufficient documentation

## 2020-04-12 DIAGNOSIS — Z90710 Acquired absence of both cervix and uterus: Secondary | ICD-10-CM | POA: Insufficient documentation

## 2020-04-12 DIAGNOSIS — D751 Secondary polycythemia: Secondary | ICD-10-CM | POA: Insufficient documentation

## 2020-04-12 DIAGNOSIS — C541 Malignant neoplasm of endometrium: Secondary | ICD-10-CM | POA: Diagnosis not present

## 2020-04-12 DIAGNOSIS — R918 Other nonspecific abnormal finding of lung field: Secondary | ICD-10-CM | POA: Diagnosis not present

## 2020-04-12 DIAGNOSIS — Z90722 Acquired absence of ovaries, bilateral: Secondary | ICD-10-CM | POA: Diagnosis not present

## 2020-04-12 DIAGNOSIS — Z79899 Other long term (current) drug therapy: Secondary | ICD-10-CM | POA: Diagnosis not present

## 2020-04-12 DIAGNOSIS — F17210 Nicotine dependence, cigarettes, uncomplicated: Secondary | ICD-10-CM | POA: Insufficient documentation

## 2020-04-12 NOTE — Progress Notes (Signed)
[place] CONSULT NOTE  Patient Care Team: Dion Body, MD as PCP - General (Family Medicine) Clent Jacks, RN as Registered Nurse Gillis Ends, MD as Referring Physician (Obstetrics and Gynecology) Robert Bellow, MD (General Surgery)  CHIEF COMPLAINTS/PURPOSE OF CONSULTATION: lung mass  #  Oncology History Overview Note  # APRIl 2021- [[facility]]New ill-defined masslike focus of consolidation in the perihilar right lower lobe associated with narrowing of the right lower lobe bronchus and volume loss and bandlike consolidation in the medial basilar right lower lobe. Central right lower lobe malignancy not excluded; MAY 4th2021- PET scan- improving- likley infectious.  # ENODMETRIAL CANCER 2017; stage I Dr. Theora Gianotti.  No adjuvant therapy.  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  female longstanding history of smoking currently on lung cancer screening program noted to have a abnormal CT scan.  Patient is here for initial consultation.   Patient noted to have a right infrahilar/right lower lobe mass concerning for malignancy on the CT scan in April 2021.  This was further followed by PET scan on May 4.   Patient denies any unusual cough or hemoptysis.  Patient has chronic shortness of breath especially exertion.  Chronic fatigue.  Denies any weight loss.  Chronic back pain joint pains.   Review of Systems  Constitutional: Positive for malaise/fatigue. Negative for chills, diaphoresis, fever and weight loss.  HENT: Negative for nosebleeds and sore throat.   Eyes: Negative for double vision.  Respiratory:  Positive for cough and shortness of breath. Negative for hemoptysis, sputum production and wheezing.   Cardiovascular: Negative for chest pain, palpitations, orthopnea and leg swelling.  Gastrointestinal: Negative for abdominal pain, blood in stool, constipation, diarrhea, heartburn, melena, nausea and vomiting.  Genitourinary: Negative for dysuria, frequency and urgency.  Musculoskeletal: Positive for back pain and joint pain.  Skin: Negative.  Negative for itching and rash.  Neurological: Negative for dizziness, tingling, focal weakness, weakness and headaches.  Endo/Heme/Allergies: Does not bruise/bleed easily.  Psychiatric/Behavioral: Negative for depression. The patient is not nervous/anxious and does not have insomnia.      MEDICAL HISTORY:  Past Medical History:  Diagnosis Date  . A-fib ([place])   . Anemia   . Asthma   . Cancer ([place])    uterine ca  . Chronic cough   . COPD (chronic obstructive pulmonary disease) ([place])   . Dysrhythmia    ATRIAL FIB.  [name] Endometrial cancer ([place])   . Gallstones 10/16/2016  . Hypertension   . Hypoxemia   . Obesity   . Oxygen dependent   . Pneumonia   . Sleep apnea   . Tachycardia   . Tobacco use     SURGICAL HISTORY: Past Surgical History:  Procedure Laterality Date  . ABDOMINAL HYSTERECTOMY    . CATARACT EXTRACTION, BILATERAL  2015  . CHOLECYSTECTOMY N/A 01/23/2017   Procedure: LAPAROSCOPIC CHOLECYSTECTOMY WITH INTRAOPERATIVE CHOLANGIOGRAM;  Surgeon: Robert Bellow, MD;  Location: [facility] ORS;  Service: General;  Laterality: N/A;  . COLONOSCOPY WITH PROPOFOL N/A 09/08/2017   Procedure: COLONOSCOPY WITH PROPOFOL;  Surgeon: Manya Silvas, MD;  Location[facility] END[facility]COPY;  Service: Endoscopy;  Laterality: N/A;  . COLONOSCOPY WITH PROPOFOL  N/A 12/22/2019   Procedure: COLONOSCOPY WITH PROPOFOL;  Surgeon: Toledo, Benay Pike, MD;  Location: [facility] ENDOSCOPY;  Service: Gastroenterology;  Laterality: N/A;  . ESOPHAGOGASTRODUODENOSCOPY (EGD) WITH  PROPOFOL N/A 09/08/2017   Procedure: ESOPHAGOGASTRODUODENOSCOPY (EGD) WITH PROPOFOL;  Surgeon: Manya Silvas, MD;  Location: [facility] ENDOSCOPY;  Service: Endoscopy;  Laterality: N/A;  . EYE SURGERY    . PELVIC LYMPH NODE DISSECTION N/A 10/16/2016   Procedure: PELVIC LYMPH NODE DISSECTION;  Surgeon: Gillis Ends, MD;  Location: [facility] ORS;  Service: Gynecology;  Laterality: N/A;  . ROBOTIC ASSISTED TOTAL HYSTERECTOMY WITH BILATERAL SALPINGO OOPHERECTOMY Bilateral 10/16/2016   Procedure: ROBOTIC ASSISTED TOTAL HYSTERECTOMY WITH BILATERAL SALPINGO OOPHORECTOMY;  Surgeon: Gillis Ends, MD;  Location: [facility] ORS;  Service: Gynecology;  Laterality: Bilateral;  . SENTINEL NODE BIOPSY N/A 10/16/2016   Procedure: SENTINEL NODE INJECTION;  Surgeon: Gillis Ends, MD;  Location: [facility] ORS;  Service: Gynecology;  Laterality: N/A;  . TUBAL LIGATION      SOCIAL HISTORY: Social History   Socioeconomic History  . Marital status: Married    Spouse name: Not on file  . Number of children: Not on file  . Years of education: Not on file  . Highest education level: Not on file  Occupational History  . Not on file  Tobacco Use  . Smoking status: Current Every Day Smoker    Packs/day: 1.00    Years: 53.00    Pack years: 53.00  . Smokeless tobacco: Never Used  . Tobacco comment: 59 pk yr - 1.5 ppd; 39 years; started age 76  Substance and Sexual Activity  . Alcohol use: No  . Drug use: No  . Sexual activity: Not on file  Other Topics Concern  . Not on file  Social History Narrative  . Not on file   Social Determinants of Health   Financial Resource Strain:   . Difficulty of Paying Living Expenses:   Food Insecurity:   . Worried About Charity fundraiser in the Last Year:   . Arboriculturist in the Last Year:   Transportation Needs:   . Film/video editor (Medical):   [name] Lack of Transportation (Non-Medical):   Physical Activity:   . Days of Exercise per Week:   .  Minutes of Exercise per Session:   Stress:   . Feeling of Stress :   Social Connections:   . Frequency of Communication with Friends and Family:   . Frequency of Social Gatherings with Friends and Family:   . Attends Religious Services:   . Active Member of Clubs or Organizations:   . Attends Archivist Meetings:   [name] Marital Status:   Intimate Partner Violence:   . Fear of Current or Ex-Partner:   . Emotionally Abused:   [name] Physically Abused:   . Sexually Abused:     FAMILY HISTORY: Family History  Problem Relation Age of Onset  . Kidney cancer Mother 69       deceased 67  . Colon cancer Father 40       deceased 10  . Melanoma Sister 22       deceased 41  . Uterine cancer Sister 73  . Cancer Paternal Grandmother        unk. age or type  . Cancer Other        maternal and paternal aunts/uncles with cancer; no specifics available    ALLERGIES:  is allergic to bismuth subsalicylate.  MEDICATIONS:  Current Outpatient  Medications  Medication Sig Dispense Refill  . acetaminophen (TYLENOL) 500 MG tablet Take 1,500 mg by mouth daily as needed for mild pain.    [name] albuterol (PROVENTIL HFA;VENTOLIN HFA) 108 (90 Base) MCG/ACT inhaler Inhale 2 puffs into the lungs every 6 (six) hours as needed for wheezing or shortness of breath. 1 Inhaler 1  . apixaban (ELIQUIS) 5 MG TABS tablet Take 5 mg by mouth 2 (two) times daily.    [name] aspirin EC 81 MG EC tablet Take 1 tablet (81 mg total) by mouth daily.    . Calcium Carbonate-Vitamin D (OYSTER SHELL CALCIUM/D) 500-400 MG-UNIT TABS Take 1 tablet by mouth daily.     . cyclobenzaprine (FLEXERIL) 5 MG tablet Take 5 mg by mouth 3 (three) times daily as needed.    . diltiazem (CARDIZEM CD) 120 MG 24 hr capsule Take 1 capsule (120 mg total) by mouth daily. 30 capsule 0  . docusate sodium (COLACE) 100 MG capsule Take 1 capsule (100 mg total) by mouth daily as needed for mild constipation. 60 capsule 3  . DULoxetine (CYMBALTA) 30 MG capsule  Take by mouth.    . furosemide (LASIX) 20 MG tablet Take 1 tablet (20 mg total) by mouth 2 (two) times daily. 60 tablet 0  . ibuprofen (ADVIL,MOTRIN) 200 MG tablet Take 400 mg by mouth every 6 (six) hours as needed for headache or moderate pain.    [name] linaclotide (LINZESS) 72 MCG capsule Take by mouth.    . nystatin (MYCOSTATIN/NYSTOP) powder Apply topically 3 (three) times daily. 60 g 0  . polyethylene glycol (MIRALAX / GLYCOLAX) packet Take 17 g by mouth at bedtime.     [name] umeclidinium-vilanterol (ANORO ELLIPTA) 62.5-25 MCG/INH AEPB INHALE ONE PUFF BY MOUTH ONCE DAILY 60 each 5  . vitamin B-12 (CYANOCOBALAMIN) 1000 MCG tablet Take 1,000 mcg by mouth daily.     . Vitamin D, Ergocalciferol, (DRISDOL) 1.25 MG (50000 UT) CAPS capsule Take 50,000 Units by mouth once a week.    . gabapentin (NEURONTIN) 300 MG capsule Take 600 mg by mouth 2 (two) times daily.     No current facility-administered medications for this visit.      [name]  PHYSICAL EXAMINATION: ECOG PERFORMANCE STATUS: 0 - Asymptomatic  Vitals:   04/12/20 1416  BP: (!) 151/65  Pulse: 79  SpO2: 92%   Filed Weights   04/12/20 1416  Weight: 218 lb 2 oz (98.9 kg)    Physical Exam  Constitutional: She is oriented to person, place, and time and well-developed, well-nourished, and in no distress.  Patient obese.  Accompanied husband.  She is walking independently.  HENT:  Head: Normocephalic and atraumatic.  Mouth/Throat: Oropharynx is clear and moist. No oropharyngeal exudate.  Eyes: Pupils are equal, round, and reactive to light.  Cardiovascular: Normal rate and regular rhythm.  Pulmonary/Chest: No respiratory distress. She has no wheezes.  Bilateral scattered wheezing noted.  Decreased air entry.  Abdominal: Soft. Bowel sounds are normal. She exhibits no distension and no mass. There is no abdominal tenderness. There is no rebound and no guarding.  Musculoskeletal:        General: No tenderness or edema. Normal range of  motion.     Cervical back: Normal range of motion and neck supple.  Neurological: She is alert and oriented to person, place, and time.  Skin: Skin is warm.  Psychiatric: Affect normal.     LABORATORY DATA:  I have reviewed the data as listed Lab Results  Component  Value Date   WBC 10.1 01/15/2017   HGB 14.0 01/15/2017   HCT 40.9 01/15/2017   MCV 87.8 01/15/2017   PLT 337 01/15/2017   Recent Labs    02/18/20 1011  CREATININE 0.60    RADIOGRAPHIC STUDIES: I have personally reviewed the radiological images as listed and agreed with the findings in the report. NM PET Image Initial (PI) Skull Base To Thigh  Result Date: 04/11/2020 CLINICAL DATA:  Initial treatment strategy for perihilar right lower lobe abnormality on screening CT. Uterine/cervical cancer. EXAM: NUCLEAR MEDICINE PET SKULL BASE TO THIGH TECHNIQUE: 11.9 mCi F-18 FDG was injected intravenously. Full-ring PET imaging was performed from the skull base to thigh after the radiotracer. CT data was obtained and used for attenuation correction and anatomic localization. Fasting blood glucose: 102 mg/dl COMPARISON:  Lung cancer screening CT 04/04/2020. Abdominopelvic CT 02/18/2020. Most recent PET 03/20/2018. FINDINGS: Mediastinal blood pool activity: SUV max 2.8 Liver activity: SUV max NA NECK: No areas of abnormal hypermetabolism. Incidental CT findings: No cervical adenopathy. Left-sided calcified thyroid nodule is similar at 1.7 x 1.6 cm on 59/3 (compared to the PET of 03/20/2018). CHEST: The right central lower lobe/infrahilar soft tissue thickening and endobronchial compression have resolved since the 04/04/2020 PET. No correlate hypermetabolism in this area. No thoracic nodal hypermetabolism. Incidental CT findings: Aortic and coronary artery atherosclerosis. Mild cardiomegaly. Moderate centrilobular emphysema. Pulmonary nodules have been detailed previously. ABDOMEN/PELVIS: No abdominopelvic parenchymal or nodal hypermetabolism.  Incidental CT findings: Left adrenal thickening with maintenance of adreniform shape. Normal right adrenal gland. Abdominal aortic atherosclerosis. Cholecystectomy. Hysterectomy. Mild pelvic floor laxity. SKELETON: No abnormal marrow activity. Incidental CT findings: Osteopenia. IMPRESSION: 1. Since the lung cancer screening CT of 04/04/2020, the area of right central lower lobe soft tissue thickening and bronchial narrowing has resolved, consistent with infection. 2. Incidental findings, including: Aortic atherosclerosis (ICD10-I70.0), coronary artery atherosclerosis and emphysema (ICD10-J43.9). 3. Similar left-sided thyroid nodule compared to 03/20/2018. No followup recommended (ref: J Am Coll Radiol. 2015 Feb;12(2): 143-50). Electronically Signed   By: Abigail Miyamoto M.D.   On: 04/11/2020 16:15   CT CHEST LUNG CANCER SCREENING LOW DOSE WO CONTRAST  Result Date: 04/04/2020 CLINICAL DATA:  69 year old asymptomatic female current smoker with 80.5 pack-year smoking history. EXAM: CT CHEST WITHOUT CONTRAST LOW-DOSE FOR LUNG CANCER SCREENING TECHNIQUE: Multidetector CT imaging of the chest was performed following the standard protocol without IV contrast. COMPARISON:  02/04/2019 screening chest CT. FINDINGS: Cardiovascular: Normal heart size. No significant pericardial effusion/thickening. Three-vessel coronary atherosclerosis. Atherosclerotic nonaneurysmal thoracic aorta. Stable top-normal caliber main pulmonary artery (3.2 cm diameter). Mediastinum/Nodes: Stable coarsely calcified 2.2 cm left thyroid nodule. This has been documented on prior studies (ref: J Am Coll Radiol. 2015 Feb;12(2): 143-50). Unremarkable esophagus. No pathologically enlarged axillary, mediastinal or hilar lymph nodes, noting limited sensitivity for the detection of hilar adenopathy on this noncontrast study. Lungs/Pleura: No pneumothorax. No pleural effusion. Mild-to-moderate centrilobular emphysema with diffuse bronchial wall thickening.  Previously visualized pulmonary nodules are either stable or resolved. There is a new ill-defined masslike focus of consolidation in perihilar right lower lobe measuring approximately 32.7 mm in volume derived mean diameter (series 3/image 182), associated with narrowing of the right lower lobe bronchus and volume loss and bandlike consolidation in the medial basilar right lower lobe. Upper abdomen: Cholecystectomy. Musculoskeletal: No aggressive appearing focal osseous lesions. Low-attenuation superficial subcutaneous 2.1 cm lesion in the left back near the midline, not significantly changed, presumably a benign lesion such as a sebaceous cyst. Marked thoracic  spondylosis. IMPRESSION: 1. Lung-RADS 4B, suspicious. New ill-defined masslike focus of consolidation in the perihilar right lower lobe associated with narrowing of the right lower lobe bronchus and volume loss and bandlike consolidation in the medial basilar right lower lobe. Central right lower lobe malignancy not excluded. Recommend further evaluation with PET-CT and/or bronchoscopy. Additional imaging evaluation or consultation with Pulmonology or Thoracic Surgery recommended. 2. Three-vessel coronary atherosclerosis. 3. Aortic Atherosclerosis (ICD10-I70.0) and Emphysema (ICD10-J43.9). These results will be called to the ordering clinician or representative by the Radiologist Assistant, and communication documented in the PACS or [employer]. Electronically Signed   By: Ilona Sorrel M.D.   On: 04/04/2020 12:03    ASSESSMENT & PLAN:   Right lower lobe lung mass # RLL mass- incidentally noted on low-dose CT April 2021.  However PET scan Apr 11, 2020-significant improvement/resolution of the right lower lobe mass-suggestive of infectious/inflammatory cause.  No further work-up is recommended.  Continue follow-up surveillance low-dose CT scan.   # Endometrial cancer [Dr.Secord]- Stage I; no adjuvant therapy.  No clinical evidence of  recurrence.  #Genetic counseling-reviewed genetic testing negative for any predisposing genetic mutations.  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  Discussed importance of quitting smoking/see below; sleep study; patient has appointment with pulmonary soon.  Discussed regarding phlebotomy versus blood donation.  Patient undecided; she will let us know.  # Discussed with the patient regarding the ill effects of smoking- including but not limited to cardiac lung and vascular diseases and malignancies. Counseled against smoking at length.  # DISPOSITION: # follow up as needed- Dr.B  Thank you Dr.Linthavong for allowing me to participate in the care of your pleasant patient. Please do not hesitate to contact me with questions or concerns in the interim.  # I reviewed the blood work- with the patient in detail; also reviewed the imaging independently [as summarized above]; and with the patient in detail.       All questions were answered. The patient knows to call the clinic with any problems, questions or concerns.       Cammie Sickle, MD 04/12/2020 8:03 PM

## 2020-04-12 NOTE — Assessment & Plan Note (Addendum)
#   RLL mass- incidentally noted on low-dose CT April 2021.  However PET scan Apr 11, 2020-significant improvement/resolution of the right lower lobe mass-suggestive of infectious/inflammatory cause.  No further work-up is recommended.  Continue follow-up surveillance low-dose CT scan.   # Endometrial cancer [Dr.Secord]- Stage I; no adjuvant therapy.  No clinical evidence of recurrence.  #Genetic counseling-reviewed genetic testing negative for any predisposing genetic mutations.  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  Discussed importance of quitting smoking/see below; sleep study; patient has appointment with pulmonary soon.  Discussed regarding phlebotomy versus blood donation.  Patient undecided; she will let us know.  # Discussed with the patient regarding the ill effects of smoking- including but not limited to cardiac lung and vascular diseases and malignancies. Counseled against smoking at length.  # DISPOSITION: # follow up as needed- Dr.B  Thank you Dr.Linthavong for allowing me to participate in the care of your pleasant patient. Please do not hesitate to contact me with questions or concerns in the interim.  # I reviewed the blood work- with the patient in detail; also reviewed the imaging independently [as summarized above]; and with the patient in detail.

## 2020-07-31 DIAGNOSIS — E0590 Thyrotoxicosis, unspecified without thyrotoxic crisis or storm: Secondary | ICD-10-CM | POA: Insufficient documentation

## 2020-08-01 ENCOUNTER — Other Ambulatory Visit: Payer: Self-pay | Admitting: Family Medicine

## 2020-08-01 DIAGNOSIS — Z1231 Encounter for screening mammogram for malignant neoplasm of breast: Secondary | ICD-10-CM

## 2020-08-16 ENCOUNTER — Ambulatory Visit
Admission: RE | Admit: 2020-08-16 | Discharge: 2020-08-16 | Disposition: A | Discharge status: Home / Self Care | Payer: Medicare HMO | Source: Ambulatory Visit | Attending: Family Medicine | Admitting: Family Medicine

## 2020-08-16 ENCOUNTER — Other Ambulatory Visit: Payer: Self-pay

## 2020-08-16 ENCOUNTER — Ambulatory Visit: Payer: Medicare HMO

## 2020-08-16 DIAGNOSIS — Z1231 Encounter for screening mammogram for malignant neoplasm of breast: Secondary | ICD-10-CM | POA: Insufficient documentation

## 2020-08-22 ENCOUNTER — Other Ambulatory Visit: Payer: Self-pay | Admitting: Family Medicine

## 2020-08-22 DIAGNOSIS — R928 Other abnormal and inconclusive findings on diagnostic imaging of breast: Secondary | ICD-10-CM

## 2020-08-22 DIAGNOSIS — N6489 Other specified disorders of breast: Secondary | ICD-10-CM

## 2020-08-25 ENCOUNTER — Ambulatory Visit
Admission: RE | Admit: 2020-08-25 | Discharge: 2020-08-25 | Disposition: A | Discharge status: Home / Self Care | Payer: Medicare HMO | Source: Ambulatory Visit | Attending: Family Medicine | Admitting: Family Medicine

## 2020-08-25 DIAGNOSIS — R928 Other abnormal and inconclusive findings on diagnostic imaging of breast: Secondary | ICD-10-CM | POA: Diagnosis present

## 2020-08-25 DIAGNOSIS — N6489 Other specified disorders of breast: Secondary | ICD-10-CM | POA: Diagnosis present

## 2020-08-30 ENCOUNTER — Other Ambulatory Visit: Payer: Self-pay | Admitting: Family Medicine

## 2020-08-30 DIAGNOSIS — N6320 Unspecified lump in the left breast, unspecified quadrant: Secondary | ICD-10-CM

## 2020-10-09 NOTE — Progress Notes (Unsigned)
PT STABLE AT TIME OF DISCHARGE 

## 2020-12-26 ENCOUNTER — Other Ambulatory Visit: Payer: Medicare HMO

## 2020-12-26 ENCOUNTER — Ambulatory Visit: Payer: Medicare HMO | Admitting: Hematology and Oncology

## 2020-12-26 ENCOUNTER — Ambulatory Visit: Payer: Medicare HMO

## 2021-01-31 DIAGNOSIS — I10 Essential (primary) hypertension: Secondary | ICD-10-CM | POA: Insufficient documentation

## 2021-02-14 ENCOUNTER — Inpatient Hospital Stay: Payer: Medicare HMO | Attending: Obstetrics and Gynecology | Admitting: Obstetrics and Gynecology

## 2021-02-14 VITALS — BP 147/78 | HR 86 | Temp 98.0°F | Resp 20 | Wt 236.8 lb

## 2021-02-14 DIAGNOSIS — S42309A Unspecified fracture of shaft of humerus, unspecified arm, initial encounter for closed fracture: Secondary | ICD-10-CM | POA: Insufficient documentation

## 2021-02-14 DIAGNOSIS — Z8542 Personal history of malignant neoplasm of other parts of uterus: Secondary | ICD-10-CM | POA: Diagnosis not present

## 2021-02-14 DIAGNOSIS — Z08 Encounter for follow-up examination after completed treatment for malignant neoplasm: Secondary | ICD-10-CM

## 2021-02-14 DIAGNOSIS — F17200 Nicotine dependence, unspecified, uncomplicated: Secondary | ICD-10-CM | POA: Insufficient documentation

## 2021-02-14 NOTE — Patient Instructions (Signed)
Please contact Dr. Guido Sander office for follow up in 6 months (08/2021).

## 2021-02-14 NOTE — Progress Notes (Signed)
Gynecologic Oncology Interval Visit   Referring Provider: Boykin Nearing, MD [address] [facility] [place], [place] [address] [phone]  Chief Concern: surveillance visit for endometrial cancer  Subjective:  Jessica Dalton is a 70 y.o. female who is seen in consultation from Dr. Ouida Sills & Dr. Leonides Schanz for grade 1 endometrial cancer, who returns to clinic for surveillance today.   She has not see Dr. Leonides Schanz in the interim.  She is followed by lung cancer screening program and had PET in May 2021 for suspicious nodule which was negative. Last mammogram 08/25/20 was negative. Colonoscopy on 12/22/2019.    Gynecologic Oncology History She was referred to Dr. Ouida Sills by Dr. Netty Starring for PMB.  grade 1 endometrial cancer. EMBx and Pap performed on 09/09/2016. Uterus sounded to 10 cm. 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 Pap NILM.   Dr. Ouida Sills also ordered a CT scan to evaluate upper abdominal pain symptoms.    On 10/16/2016 she underwent RA-TLH BSO and SLN injection, mapping and biopsy with Dr. Theora Gianotti. Her intraoperative and postoperative course was significant for pulmonary issues. She had desaturation due to chronic pulmonary disease from smoking.   09/24/2016 preoperative CT scan A/P revealed the following: IMPRESSION: 1. Single borderline enlarged portal hepatic lymph node. No additional findings suspicious for or suggestive of metastatic disease. Further imaging with PET-CT may be helpful to assess for hyper metabolism within this lymph node. 2. Left adrenal nodule, indeterminate. This could be better assessed with adrenal protocol MRI or PET-CT. 3. Thickening of the endometrium which in a postmenopausal female is considered abnormal and consistent with the clinical history of endometrial sarcoma. Stones identified within the gallbladder are noted measuring up to 3 cm.   I reviewed the findings preoperatively and spoke  with Dr. Bary Castilla to see if he could be available for cholecystectomy as she was symptomatic with the gallstones. Unfortunately he was not available but recommended postoperative referral.  Preop CXR:  IMPRESSION: Hyperinflation and mild diffuse interstitial prominence consistent with the history of reactive airway disease. There is no pneumonia, CHF, nor other acute cardiopulmonary abnormality.   Aortic atherosclerosis.  Pathology noted as below:  Cytology: Negative   DIAGNOSIS:  A. SENTINEL LYMPH NODE, LEFT; EXCISION:  - NO TUMOR SEEN IN ONE LYMPH NODE (0/1).   B. SENTINEL LYMPH NODE, LEFT; EXCISION:  - NO TUMOR SEEN IN TWO LYMPH NODES (0/2).   C. UTERUS WITH CERVIX, BILATERAL FALLOPIAN TUBES AND OVARIES;  HYSTERECTOMY WITH BILATERAL SALPINGO-OOPHORECTOMY:  - ENDOMETRIOID ADENOCARCINOMA FIGO I.  - SURFACE DEGENERATIVE PAPILLARY FEATURES.  - MYOMETRIAL INVASION IS NOT IDENTIFIED.  - CHRONIC CERVICITIS.  - STROMAL HYPERPLASIA OF BILATERAL OVARIES.  - NO PATHOLOGIC CHANGE, BILATERAL FALLOPIAN TUBES.  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    ENDOMETRIUM: Hysterectomy, With or Without Other Organs or Tissues  Specimens InvolvedA: Sentinel lymph node, left  B: Sentinel lymph node, right proximal obturator  C: Uterus with cervix, bilateral tubes and ovaries   Endometrium, Hysterectomy, With or Without Other Organs or Tissues  Cancer Case Summary  Specimen: Uterine corpus  SPECIMEN  Procedure  Simple hysterectomy  Additional Procedures:   Bilateral salpingo-oophorectomy  Lymph Node Sampling:     Performed  Pelvic lymph nodes  Specimen Integrity: Intact hysterectomy specimen  TUMOR  Histologic Type:    Endometrioid adenocarcinoma, not otherwise  characterized  Histologic Grade:   FIGO grade 1  EXTENT  Tumor Size:    Greatest  dimension (cm)  4.2cm  Myometrial Invasion:     Not identified  Involvement of Cervix:   Not involved  Other  Organs Submitted:  Right ovary  Not involved  Left ovary  Not involved  Right fallopian tube  Not involved  Left fallopian tube  Not involved  ACCESSORY FINDINGS  Lymph-Vascular Invasion: Not identified  STAGE (pTNM [FIGO])  Primary Tumor (pT): pT1a [IA]:  Tumor limited to endometrium or invades less than one-half  of the myometrium  Regional Lymph Nodes (pN) pN0: No regional lymph node metastasis  Pelvic Lymph Nodes: Number of Lymph Nodes Examined:    Specify 3  Number of Lymph Nodes Involved:    Specify 0  Para-aortic Lymph Nodes: No para-aortic nodes submitted or found  Distant Metastasis (pM): Not applicable    Since surgery she was seen by Dr. Bary Castilla on 11/06/2016 with the plan to proceed with Laparoscopic Cholecystectomy with Intraoperative Cholangiogram. She had a consult with Dr. Rogue Bussing for erythocytosis - work up still ongoing. She was admitted on 11/11/2016 throught 11/15/2016 with sepsis, pneumonia, COPD exacerbation,  dyspnea, and new onset atrial fibrillation. She is being treated by Dr. Netty Starring and Dr. Clayborn Bigness.  Her cholecystectomy was canceled due to her other comorbidities.   In 2/18 Dr Bary Castilla performed LS cholecystectomy without incident.    Low Dose CT Lung Cancer Screening Follow-up done on 12/31/2017 - Left lower lobe pulmonary nodule is stable with volume derived diameter of 7.62mm. Lung-Rads 2. Repeat in one year. Also noted: interval progression of atelectasis and/or scarring of the peripheral lingula, emphysema, and aortic atherosclerosis.  CT C/A/P 12/31/2017 IMPRESSION: 1. Stable exam.  No new or progressive interval findings. 2. Small left lower lobe pulmonary nodule unchanged in the 1.5 year interval since prior study consistent with benign etiology. 3. No change 15 mm porta hepatis lymph node identified on prior study. 4. Stable 14 mm left adrenal nodule, likely adenoma. 5.  Aortic Atherosclerois (ICD10-170.0)  IMPRESSION from CT Chest LCS 1. Left  lower lobe pulmonary nodule is stable by CT screening criteria with volume derived equivalent diameter of 7.9 mm today. Lung-RADS 2, benign appearance or behavior. Continue annual screening with low-dose chest CT without contrast in 12 months.  2. Interval progression of atelectasis and/or scarring in the peripheral lingula. 3.  Emphysema. (ICD10-J43.9) 4.  Aortic Atherosclerois (ICD10-170.0)  PET 03/20/2018 IMPRESSION: 1. No current abnormal hypermetabolic activity to suggest active malignancy. The mildly enlarged porta hepatis lymph nodes are not hypermetabolic and likely reactive. 2. There are some scattered air-fluid levels in nondilated small bowel loops which could reflect a mild enteritis. No overt bowel wall thickening is observed. 3. Chronically stable 7 by 5 mm left lower lobe lung nodule, not appreciably hypermetabolic but below sensitive PET-CT size thresholds. No change from earliest available comparison of 09/24/2016. 4. Other imaging findings of potential clinical significance: Aortic Atherosclerosis (ICD10-I70.0) and Emphysema (ICD10-J43.9). Small left adrenal adenoma. Lower lumbar facet arthropathy. Stable lucency along the left acetabular roof, probably a geode or degenerative subcortical cystic lesion, not hypermetabolic.  She has been NED since her endometrial cancer surgery.   Seen in clinic on 03/11/2018 and had a negative exam. She had abdominal complaints which prompted imaging. She was supposed to see Dr. Ouida Sills but did not have that appointment. She would like to see one of his partners. She feels more comfortable with a female provider.   She saw Dr. Leonides Schanz 08/05/2019 with negative exam. She continued to have abdominal complaints at that time  and she was referred to GI. In the interim she has had a colonoscopy on 12/22/2019.   Last mammogram 02/17/2019 was reported as bi-rads category 1: negative. She continues to be followed by the lung cancer screening program and is due  for annual screening.   02/04/2019 IMPRESSION: 1. Lung-RADS 2, benign appearance or behavior. Continue annual screening with low-dose chest CT without contrast in 12 months. 2. Three-vessel coronary atherosclerosis.   Colonoscopy 12/22/2019 Findings: - One 4 mm polyp at the hepatic flexure, removed with a jumbo cold forceps. Resected and retrieved. - Two 3 to 4 mm polyps in the rectum, removed with a jumbo cold forceps. Resected and retrieved. - Two 5 to 7 mm polyps in the distal rectum, removed with a hot snare. Resected and retrieved. - Non-bleeding internal hemorrhoids. - Non-bleeding internal hemorrhoids. - The examination was otherwise normal.  DIAGNOSIS:  A. COLON POLYP, HEPATIC FLEXURE; COLD BIOPSY:  - TUBULAR ADENOMA.  - NEGATIVE FOR HIGH-GRADE DYSPLASIA AND MALIGNANCY.   B. RECTUM POLYPS X4; HOT SNARE (2) AND COLD BIOPSY (2):  - TRADITIONAL SERRATED ADENOMA, TWO FRAGMENTS.  - HYPERPLASTIC POLYP, TWO FRAGMENTS.  - TUBULAR ADENOMA, ONE FRAGMENT.  - NEGATIVE FOR HIGH-GRADE DYSPLASIA AND MALIGNANCY  Other issues H/o Nystatin use for dermatologic candidiasis with improvement.   H/o colonic polyps 09/08/2017 colonoscopy - multiple polyps with tubular adenoma, sessile serrated adenomas and hyperplastic polyps. Hiatal hernia, gastritis, esophagitis; repeat 12/2019 findings noted above.  09/08/2017 endoscopy - esophagitis and gastritis  GI recommended repeat colonoscopy in 2020 and genetic testing.  Per patient she was treated for helicobacter but has not had follow up. Per GI note she does not meet criteria for IBS.   Genetic testing: Testing did not reveal a pathogenic mutation in any of the genes analyzed.  A copy of the genetic test report will be scanned into Epic under the Media tab.   The genes analyzed were the 83 genes on [facility]'s Multi-Cancer panel (ALK, APC, ATM, AXIN2, BAP1, BARD1, BLM, BMPR1A, BRCA1, BRCA2, BRIP1, CASR, CDC73, CDH1, CDK4, CDKN1B, CDKN1C, CDKN2A,  CEBPA, CHEK2, CTNNA1, DICER1, DIS3L2, EGFR, EPCAM, FH, FLCN, GATA2, GPC3, GREM1, HOXB13, HRAS, KIT, MAX, MEN1, MET, MITF, MLH1, MSH2, MSH3, MSH6, MUTYH, NBN, NF1, NF2, NTHL1, PALB2, PDGFRA, PHOX2B, PMS2, POLD1, POLE, POT1, PRKAR1A, PTCH1, PTEN, RAD50, RAD51C, RAD51D, RB1, RECQL4, RET, RUNX1, SDHA, SDHAF2, SDHB, SDHC, SDHD, SMAD4, SMARCA4, SMARCB1, SMARCE1, STK11, SUFU, TERC, TERT, TMEM127, TP53, TSC1, TSC2, VHL, WRN, WT1).   A Variant of Uncertain Significance was detected: RECQL4 c.1684C>T (p.Arg562Trp). This is still considered a normal result.    Problem List: Patient Active Problem List   Diagnosis Date Noted   Right lower lobe lung mass 04/12/2020   Personal history of tobacco use, presenting hazards to health 09/18/2017   History of endometrial cancer 09/02/2017   Pleural effusion 01/14/2017   Constipation 12/04/2016   Atrial fibrillation with RVR ([place]) 11/11/2016   Erythrocytosis 11/08/2016   Cigarette smoker    Gallstones 10/16/2016   Endometrial cancer ([place]) 09/18/2016    Past Medical History: Past Medical History:  Diagnosis Date   A-fib ([place])    Anemia    Asthma    Cancer ([place])    uterine ca   Chronic cough    COPD (chronic obstructive pulmonary disease) ([facility])    Dysrhythmia    ATRIAL FIB.   Endometrial cancer ([place])    Gallstones 10/16/2016   Hypertension    Hypoxemia    Obesity    Oxygen dependent  Pneumonia    Sleep apnea    Tachycardia    Tobacco use     Past Surgical History: Past Surgical History:  Procedure Laterality Date   ABDOMINAL HYSTERECTOMY     CATARACT EXTRACTION, BILATERAL  2015   CHOLECYSTECTOMY N/A 01/23/2017   Procedure: LAPAROSCOPIC CHOLECYSTECTOMY WITH INTRAOPERATIVE CHOLANGIOGRAM;  Surgeon: Robert Bellow, MD;  Location: [facility];  Service: General;  Laterality: N/A;   COLONOSCOPY WITH PROPOFOL N/A 09/08/2017   Procedure: COLONOSCOPY WITH PROPOFOL;  Surgeon: Manya Silvas, MD;  Location: [facility] ENDOSCOPY;  Service: Endoscopy;   Laterality: N/A;   COLONOSCOPY WITH PROPOFOL N/A 12/22/2019   Procedure: COLONOSCOPY WITH PROPOFOL;  Surgeon: Toledo, Benay Pike, MD;  Location: [facility] ENDOSCOPY;  Service: Gastroenterology;  Laterality: N/A;   ESOPHAGOGASTRODUODENOSCOPY (EGD) WITH PROPOFOL N/A 09/08/2017   Procedure: ESOPHAGOGASTRODUODENOSCOPY (EGD) WITH PROPOFOL;  Surgeon: Manya Silvas, MD;  [facility];  Service: Endoscopy;  Laterality: N/A;   EYE SURGERY     PELVIC LYMPH NODE DISSECTION N/A 10/16/2016   Procedure: PELVIC LYMPH NODE DISSECTION;  Surgeon: Gillis Ends, MD;  Location: [facility] ORS;  Service: Gynecology;  Laterality: N/A;   ROBOTIC ASSISTED TOTAL HYSTERECTOMY WITH BILATERAL SALPINGO OOPHERECTOMY Bilateral 10/16/2016   Procedure: ROBOTIC ASSISTED TOTAL HYSTERECTOMY WITH BILATERAL SALPINGO OOPHORECTOMY;  Surgeon: Gillis Ends, MD;  Location: [facility] ORS;  Service: Gynecology;  Laterality: Bilateral;   SENTINEL NODE BIOPSY N/A 10/16/2016   Procedure: SENTINEL NODE INJECTION;  Surgeon: Gillis Ends, MD;  Location: [facility] ORS;  Service: Gynecology;  Laterality: N/A;   TUBAL LIGATION      Past Gynecologic History:  Menarche: unknown Menstrual details: postmenopausal Last Menstrual Period: 15 years ago History of OCP/HRT use: None History of Abnormal pap: no, benign cellular changes Last pap: as per HPI   OB History:  OB History  Gravida Para Term Preterm AB Living  $Remov'3 3 3     3  'EQMgum$ SAB IAB Ectopic Multiple Live Births               # Outcome Date GA Lbr Len/2nd Weight Sex Delivery Anes PTL Lv  3 Term           2 Term           1 Term             Obstetric Comments  1st Menstrual Cycle:  ?   1st Pregnancy:  19      Family History: Family History  Problem Relation Age of Onset   Kidney cancer Mother 89       deceased 74   Colon cancer Father 35       deceased 52   Melanoma Sister 22       deceased 61   Uterine cancer Sister 62   Cancer Paternal Grandmother         unk. age or type   Cancer Other        maternal and paternal aunts/uncles with cancer; no specifics available   Breast cancer Paternal Aunt     Social History: Social History   Socioeconomic History   Marital status: Married    Spouse name: Not on file   Number of children: Not on file   Years of education: Not on file   Highest education level: Not on file  Occupational History   Not on file  Tobacco Use   Smoking status: Current Every Day Smoker    Packs/day: 1.00    Years: 53.00  Pack years: 53.00   Smokeless tobacco: Never Used   Tobacco comment: 47 pk yr - 1.5 ppd; 66 years; started age 43  Vaping Use   Vaping Use: Former  Substance and Sexual Activity   Alcohol use: No   Drug use: No   Sexual activity: Not on file  Other Topics Concern   Not on file  Social History Narrative   Not on file   Social Determinants of Health   Financial Resource Strain: Not on file  Food Insecurity: Not on file  Transportation Needs: Not on file  Physical Activity: Not on file  Stress: Not on file  Social Connections: Not on file  Intimate Partner Violence: Not on file    Allergies: Allergies  Allergen Reactions   Bismuth Subsalicylate Nausea And Vomiting    Current Medications: Current Outpatient Medications  Medication Sig Dispense Refill   acetaminophen (TYLENOL) 500 MG tablet Take 1,500 mg by mouth daily as needed for mild pain.     albuterol (PROVENTIL HFA;VENTOLIN HFA) 108 (90 Base) MCG/ACT inhaler Inhale 2 puffs into the lungs every 6 (six) hours as needed for wheezing or shortness of breath. 1 Inhaler 1   apixaban (ELIQUIS) 5 MG TABS tablet Take 5 mg by mouth 2 (two) times daily.     aspirin EC 81 MG EC tablet Take 1 tablet (81 mg total) by mouth daily.     Calcium Carbonate-Vitamin D (OYSTER SHELL CALCIUM/D) 500-400 MG-UNIT TABS Take 1 tablet by mouth daily.      cyclobenzaprine (FLEXERIL) 5 MG tablet Take 5 mg by mouth 3 (three) times daily as needed.      diltiazem (CARDIZEM CD) 120 MG 24 hr capsule Take 1 capsule (120 mg total) by mouth daily. 30 capsule 0   docusate sodium (COLACE) 100 MG capsule Take 1 capsule (100 mg total) by mouth daily as needed for mild constipation. 60 capsule 3   DULoxetine (CYMBALTA) 30 MG capsule Take by mouth.     furosemide (LASIX) 20 MG tablet Take 1 tablet (20 mg total) by mouth 2 (two) times daily. 60 tablet 0   gabapentin (NEURONTIN) 300 MG capsule Take 600 mg by mouth 2 (two) times daily.     hydrochlorothiazide (HYDRODIURIL) 12.5 MG tablet      ibuprofen (ADVIL,MOTRIN) 200 MG tablet Take 400 mg by mouth every 6 (six) hours as needed for headache or moderate pain.     linaclotide (LINZESS) 72 MCG capsule Take by mouth.     methimazole (TAPAZOLE) 5 MG tablet Take by mouth.     nystatin (MYCOSTATIN/NYSTOP) powder Apply topically 3 (three) times daily. 60 g 0   polyethylene glycol (MIRALAX / GLYCOLAX) packet Take 17 g by mouth at bedtime.     umeclidinium-vilanterol (ANORO ELLIPTA) 62.5-25 MCG/INH AEPB INHALE ONE PUFF BY MOUTH ONCE DAILY 60 each 5   vitamin B-12 (CYANOCOBALAMIN) 1000 MCG tablet Take 1,000 mcg by mouth daily.      Vitamin D, Ergocalciferol, (DRISDOL) 1.25 MG (50000 UT) CAPS capsule Take 50,000 Units by mouth once a week.     No current facility-administered medications for this visit.   Review of Systems General:  no complaints Skin: no complaints Eyes: no complaints HEENT: no complaints Breasts: no complaints Pulmonary: no complaints Cardiac: no complaints Gastrointestinal: no complaints Genitourinary/Sexual: no complaints Ob/Gyn: no complaints Musculoskeletal: no complaints Hematology: no complaints Neurologic/Psych: no complaints   Objective:  Physical Examination:  BP (!) 147/78   Pulse 86   Temp 98 F (  36.7 C)   Resp 20   Wt 236 lb 12.8 oz (107.4 kg)   SpO2 100%   BMI 44.74 kg/m    ECOG Performance Status: 0 - Asymptomatic  GENERAL: Patient is a well appearing female  in no acute distress HEENT:  Sclera clear. Anicteric NODES:  Negative axillary, supraclavicular, inguinal lymph node survery LUNGS:  Clear to auscultation bilaterally.   HEART:  Regular rate and rhythm.  ABDOMEN:  Soft, nontender.  No hernias, incisions well healed. No masses or ascites EXTREMITIES:  No peripheral edema. Atraumatic. No cyanosis SKIN:  Clear with no obvious rashes or skin changes.  NEURO:  Nonfocal. Well oriented.  Appropriate affect.  Pelvic: chaperoned by nurse tech EGBUS: no lesions Cervix:  surgically absent Vagina: no lesions, no discharge or bleeding Uterus:  surgically absent Adnexa: no palpable masses Rectovaginal: confirmatory      Assessment:  Jessica Dalton is a 70 y.o. female with stage 1A, grade 1 endometrioid endometrial cancer.  Abdominal symptoms concerning may be GI related but differential also includes recurrence. Last CT scan and PET reassuring and no evidence of malignancy.   Preoperative imaging revealed single borderline enlarged portal hepatic lymph node and left adrenal nodule, stable findings on repeat CT scan and no appreciable hypermetabolic findings on PET 03/2018 and repeat PET 2021.   History of pneumonia with abnormal CT scan in the setting of active tobacco use, stable pulmonary nodule. 02/04/2019 CT chest reassuring. Negative PET scan 2021.   Hyperplastic polyp  Medical co-morbidities complicating care:  asthma, tobacco use, and obesity. Additional issues include recent hospitalization for sepsis, pneumonia, COPD exacerbation,  dyspnea, and atrial fibrillation, now resolved. Symptomatic cholelithiasis. Constipation.  Plan:   Problem List Items Addressed This Visit       Other   History of endometrial cancer   Other Visit Diagnoses     Encounter for follow-up surveillance of endometrial cancer    -  Primary      Continue to alternative visit with Dr. Elesa Massed in 6 months and return to our clinic in 12 months.    Previously we  discussed endometrial cancer status she has an excellent prognosis with early stage disease. Her risk of recurrence is less than 10%. I have recommended continued close follow up with exams, including pelvic exams every 6 months for 3-5 years and then annually thereafter.   Continue to follow up CT lung cancer screening with Glenna Fellows, RN. Discussed smoking cessation again today.  Continue nystatin for dermatitis as needed.    Genetic testing negative for pathogenic mutation.   The patient's diagnosis, an outline of the further diagnostic and laboratory studies which will be required, the recommendation, and alternatives were discussed.  All questions were answered to the patient's satisfaction    Consuello Masse, DNP, AGNP-C [facility] [phone] (clinic)   I personally had a face to face interaction and evaluated the patient jointly with the NP, Ms. Consuello Masse.  I have reviewed her history and available records and have performed the key portions of the physical exam including  abdominal exam, pelvic exam with my findings confirming those documented above by the APP.  I have discussed the case with the APP and the patient.  I agree with the above documentation, assessment and plan which was fully formulated by me.  Counseling was completed by me.   I personally saw the patient and performed a substantive portion of this encounter in conjunction with the listed APP as documented  above.   Freddye Cardamone Gaetana Michaelis, MD  CC:  Boykin Nearing, MD [address] [facility] [place], [place] [address] [phone]  Dr. Dion Body

## 2021-03-09 ENCOUNTER — Telehealth: Payer: Self-pay | Admitting: *Deleted

## 2021-03-09 NOTE — Telephone Encounter (Signed)
Attempted to call patient to schedule annual lung CT screening.   No answer, VM left to return call to [phone] to schedule.

## 2021-03-26 ENCOUNTER — Telehealth: Payer: Self-pay | Admitting: *Deleted

## 2021-03-26 ENCOUNTER — Encounter: Payer: Self-pay | Admitting: *Deleted

## 2021-03-26 DIAGNOSIS — Z122 Encounter for screening for malignant neoplasm of respiratory organs: Secondary | ICD-10-CM

## 2021-03-26 DIAGNOSIS — Z87891 Personal history of nicotine dependence: Secondary | ICD-10-CM

## 2021-03-26 DIAGNOSIS — F17200 Nicotine dependence, unspecified, uncomplicated: Secondary | ICD-10-CM

## 2021-03-26 NOTE — Telephone Encounter (Signed)
Spoke to patient via telephone to schedule yearly lung screening scan. Current smoker, 1.5 ppd x 54 yrs. Scan scheduled for Monday, Apr 09, 2021 @ 2:30 pm. Text message sent to patient with appointment details.

## 2021-04-09 ENCOUNTER — Ambulatory Visit
Admission: RE | Admit: 2021-04-09 | Discharge: 2021-04-09 | Disposition: A | Discharge status: Home / Self Care | Payer: Medicare HMO | Source: Ambulatory Visit | Attending: Oncology | Admitting: Oncology

## 2021-04-09 ENCOUNTER — Other Ambulatory Visit: Payer: Self-pay

## 2021-04-09 DIAGNOSIS — F17200 Nicotine dependence, unspecified, uncomplicated: Secondary | ICD-10-CM | POA: Diagnosis present

## 2021-04-09 DIAGNOSIS — Z87891 Personal history of nicotine dependence: Secondary | ICD-10-CM

## 2021-04-09 DIAGNOSIS — Z122 Encounter for screening for malignant neoplasm of respiratory organs: Secondary | ICD-10-CM | POA: Diagnosis present

## 2021-04-10 ENCOUNTER — Ambulatory Visit
Admission: RE | Admit: 2021-04-10 | Discharge: 2021-04-10 | Disposition: A | Discharge status: Home / Self Care | Payer: Medicare HMO | Source: Ambulatory Visit | Attending: Family Medicine | Admitting: Family Medicine

## 2021-04-10 DIAGNOSIS — N6320 Unspecified lump in the left breast, unspecified quadrant: Secondary | ICD-10-CM

## 2021-04-10 DIAGNOSIS — R928 Other abnormal and inconclusive findings on diagnostic imaging of breast: Secondary | ICD-10-CM | POA: Insufficient documentation

## 2021-04-11 ENCOUNTER — Other Ambulatory Visit: Payer: Self-pay | Admitting: Family Medicine

## 2021-04-11 DIAGNOSIS — N6320 Unspecified lump in the left breast, unspecified quadrant: Secondary | ICD-10-CM

## 2021-08-21 ENCOUNTER — Other Ambulatory Visit: Payer: Self-pay

## 2021-08-21 ENCOUNTER — Ambulatory Visit
Admission: RE | Admit: 2021-08-21 | Discharge: 2021-08-21 | Disposition: A | Discharge status: Home / Self Care | Payer: Medicare HMO | Source: Ambulatory Visit | Attending: Family Medicine | Admitting: Family Medicine

## 2021-08-21 DIAGNOSIS — N6324 Unspecified lump in the left breast, lower inner quadrant: Secondary | ICD-10-CM | POA: Diagnosis not present

## 2021-08-21 DIAGNOSIS — N6320 Unspecified lump in the left breast, unspecified quadrant: Secondary | ICD-10-CM

## 2021-11-15 ENCOUNTER — Other Ambulatory Visit: Payer: Self-pay | Admitting: *Deleted

## 2021-11-15 DIAGNOSIS — F17210 Nicotine dependence, cigarettes, uncomplicated: Secondary | ICD-10-CM

## 2021-11-15 DIAGNOSIS — Z87891 Personal history of nicotine dependence: Secondary | ICD-10-CM

## 2021-11-20 ENCOUNTER — Ambulatory Visit
Admission: RE | Admit: 2021-11-20 | Discharge: 2021-11-20 | Disposition: A | Discharge status: Home / Self Care | Payer: Medicare HMO | Source: Ambulatory Visit | Attending: Acute Care | Admitting: Acute Care

## 2021-11-20 ENCOUNTER — Other Ambulatory Visit: Payer: Self-pay

## 2021-11-20 DIAGNOSIS — F17210 Nicotine dependence, cigarettes, uncomplicated: Secondary | ICD-10-CM | POA: Diagnosis present

## 2021-11-20 DIAGNOSIS — Z87891 Personal history of nicotine dependence: Secondary | ICD-10-CM | POA: Diagnosis present

## 2021-11-28 ENCOUNTER — Other Ambulatory Visit: Payer: Self-pay | Admitting: Acute Care

## 2021-11-28 DIAGNOSIS — F17210 Nicotine dependence, cigarettes, uncomplicated: Secondary | ICD-10-CM

## 2021-11-28 DIAGNOSIS — Z87891 Personal history of nicotine dependence: Secondary | ICD-10-CM

## 2022-02-20 ENCOUNTER — Inpatient Hospital Stay: Payer: Medicare HMO | Attending: Obstetrics and Gynecology | Admitting: Obstetrics and Gynecology

## 2022-02-20 ENCOUNTER — Other Ambulatory Visit: Payer: Self-pay

## 2022-02-20 VITALS — BP 150/67 | HR 90 | Temp 98.5°F | Resp 18 | Wt 239.0 lb

## 2022-02-20 DIAGNOSIS — Z90710 Acquired absence of both cervix and uterus: Secondary | ICD-10-CM | POA: Diagnosis not present

## 2022-02-20 DIAGNOSIS — Z90722 Acquired absence of ovaries, bilateral: Secondary | ICD-10-CM | POA: Diagnosis not present

## 2022-02-20 DIAGNOSIS — Z8542 Personal history of malignant neoplasm of other parts of uterus: Secondary | ICD-10-CM | POA: Diagnosis not present

## 2022-02-20 DIAGNOSIS — Z08 Encounter for follow-up examination after completed treatment for malignant neoplasm: Secondary | ICD-10-CM

## 2022-02-20 NOTE — Progress Notes (Signed)
Gynecologic Oncology Interval Visit  ? ?Referring Provider: ?Boykin Nearing, MD ?[place] [facility] ?[place], [place] [address] ?[phone] ? ?Chief Concern: surveillance visit for endometrial cancer ? ?Subjective:  ?Jessica Dalton is a 71 y.o. female who is seen in consultation from Dr. Ouida Sills & Dr. Leonides Schanz for grade 1 endometrial cancer s/p RA-TLH BSO and SLN mapping, biopsy 10/16/2016, who returns to clinic for continued surveillance today.  ? ?She continues to be followed by lung cancer screening program. Last imaging December 2022.  ?On oxygen supplementation now.  ? ?Last mammogram 08/21/21. Colonoscopy on 12/22/2019.  ? ? Has abdominal pain, bloating, constipation, and back pain. Same as in past. No vaginal bleeding  ? ?Gynecologic Oncology History ?She was referred to Dr. Ouida Sills by Dr. Netty Starring for PMB.  grade 1 endometrial cancer. EMBx and Pap performed on 09/09/2016. Uterus sounded to 10 cm. 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 Pap NILM.  ? ?Dr. Ouida Sills also ordered a CT scan to evaluate upper abdominal pain symptoms.   ? ?On 10/16/2016 she underwent RA-TLH BSO and SLN injection, mapping and biopsy with Dr. Theora Gianotti. Her intraoperative and postoperative course was significant for pulmonary issues. She had desaturation due to chronic pulmonary disease from smoking.  ? ?09/24/2016 preoperative CT scan A/P revealed the following: ?IMPRESSION: ?1. Single borderline enlarged portal hepatic lymph node. No additional findings suspicious for or suggestive of metastatic disease. Further imaging with PET-CT may be helpful to assess for ?hyper metabolism within this lymph node. ?2. Left adrenal nodule, indeterminate. This could be better assessed with adrenal protocol MRI or PET-CT. ?3. Thickening of the endometrium which in a postmenopausal female is considered abnormal and consistent with the clinical history of endometrial sarcoma. ?Stones  identified within the gallbladder are noted measuring up to 3 cm.  ? ?I reviewed the findings preoperatively and spoke with Dr. Bary Castilla to see if he could be available for cholecystectomy as she was symptomatic with the gallstones. Unfortunately he was not available but recommended postoperative referral. ? ?Preop CXR:  ?IMPRESSION: ?Hyperinflation and mild diffuse interstitial prominence consistent with the history of reactive airway disease. There is no pneumonia, CHF, nor other acute cardiopulmonary abnormality. ?  ?Aortic atherosclerosis. ? ?Pathology noted as below: ? ?Cytology: Negative ?  ?DIAGNOSIS:  ?A. SENTINEL LYMPH NODE, LEFT; EXCISION:  ?- NO TUMOR SEEN IN ONE LYMPH NODE (0/1).  ? ?B. SENTINEL LYMPH NODE, LEFT; EXCISION:  ?- NO TUMOR SEEN IN TWO LYMPH NODES (0/2).  ? ?C. UTERUS WITH CERVIX, BILATERAL FALLOPIAN TUBES AND OVARIES; HYSTERECTOMY WITH BILATERAL SALPINGO-OOPHORECTOMY:  ?- ENDOMETRIOID ADENOCARCINOMA FIGO I.  ?- SURFACE DEGENERATIVE PAPILLARY FEATURES.  ?- MYOMETRIAL INVASION IS NOT IDENTIFIED.  ?- CHRONIC CERVICITIS.  ?- STROMAL HYPERPLASIA OF BILATERAL OVARIES.  ?- NO PATHOLOGIC CHANGE, BILATERAL FALLOPIAN TUBES.  ?- LEIOMYOMATA, UP TO 2.3 CM; WITHOUT ATYPIA, NECROSIS OR INCREASED MITOSES.  ? ? ?ENDOMETRIUM: Hysterectomy, With or Without Other Organs or Tissues  ?Specimens InvolvedA: Sentinel lymph node, left  ?B: Sentinel lymph node, right proximal obturator  ?C: Uterus with cervix, bilateral tubes and ovaries  ? ?Endometrium, Hysterectomy, With or Without Other Organs or Tissues  ?Cancer Case Summary  ?Specimen: Uterine corpus  ?SPECIMEN  ?Procedure  ?Simple hysterectomy  ?Additional Procedures:   Bilateral salpingo-oophorectomy  ?Lymph Node Sampling:     Performed  ?Pelvic lymph nodes  ?Specimen Integrity: Intact hysterectomy specimen  ?TUMOR  ?Histologic Type:    Endometrioid adenocarcinoma, not otherwise characterized  ?Histologic Grade:  FIGO grade 1  ?EXTENT  ?Tumor Size:    Greatest  dimension (cm): 4.2cm  ?Myometrial Invasion:     Not identified  ?Involvement of Cervix:   Not involved  ?Other Organs Submitted:  Right ovary  ?Not involved  ?Left ovary  ?Not involved  ?Right fallopian tube  ?Not involved  ?Left fallopian tube  ?Not involved  ?ACCESSORY FINDINGS  ?Lymph-Vascular Invasion: Not identified  ?STAGE (pTNM [FIGO])  ?Primary Tumor (pT): pT1a [IA]:  Tumor limited to endometrium or invades less than one-half of the myometrium  ?Regional Lymph Nodes (pN) pN0: No regional lymph node metastasis  ?Pelvic Lymph Nodes: Number of Lymph Nodes Examined:    Specify 3  ?Number of Lymph Nodes Involved:    Specify 0  ?Para-aortic Lymph Nodes: No para-aortic nodes submitted or found  ?Distant Metastasis (pM): Not applicable  ?  ?Since surgery she was seen by Dr. Bary Castilla on 11/06/2016 with the plan to proceed with Laparoscopic Cholecystectomy with Intraoperative Cholangiogram. She had a consult with Dr. Rogue Bussing for erythocytosis - work up still ongoing. She was admitted on 11/11/2016 throught 11/15/2016 with sepsis, pneumonia, COPD exacerbation,  dyspnea, and new onset atrial fibrillation. She is being treated by Dr. Netty Starring and Dr. Clayborn Bigness.  Her cholecystectomy was canceled due to her other comorbidities.  ? ?In 2/18 Dr Bary Castilla performed LS cholecystectomy without incident.   ? ?Low Dose CT Lung Cancer Screening Follow-up done on 12/31/2017 - Left lower lobe pulmonary nodule is stable with volume derived diameter of 7.77m. Lung-Rads 2. Repeat in one year. Also noted: interval progression of atelectasis and/or scarring of the peripheral lingula, emphysema, and aortic atherosclerosis.  ? ?CT C/A/P 12/31/2017 ?IMPRESSION: ?1. Stable exam.  No new or progressive interval findings. ?2. Small left lower lobe pulmonary nodule unchanged in the 1.5 year interval since prior study consistent with benign etiology. ?3. No change 15 mm porta hepatis lymph node identified on prior study. ?4. Stable 14 mm left  adrenal nodule, likely adenoma. ?5.  Aortic Atherosclerois (ICD10-170.0) ? ?IMPRESSION from CT Chest LCS ?1. Left lower lobe pulmonary nodule is stable by CT screening criteria with volume derived equivalent diameter of 7.9 mm today. Lung-RADS 2, benign appearance or behavior. Continue annual screening with low-dose chest CT without contrast in 12 months.  ?2. Interval progression of atelectasis and/or scarring in the peripheral lingula. ?3.  Emphysema. (ICD10-J43.9) ?4.  Aortic Atherosclerois (ICD10-170.0) ? ?PET 03/20/2018 ?IMPRESSION: ?1. No current abnormal hypermetabolic activity to suggest active malignancy. The mildly enlarged porta hepatis lymph nodes are not hypermetabolic and likely reactive. ?2. There are some scattered air-fluid levels in nondilated small bowel loops which could reflect a mild enteritis. No overt bowel wall thickening is observed. ?3. Chronically stable 7 by 5 mm left lower lobe lung nodule, not appreciably hypermetabolic but below sensitive PET-CT size thresholds. No change from earliest available comparison of 09/24/2016. ?4. Other imaging findings of potential clinical significance: Aortic Atherosclerosis (ICD10-I70.0) and Emphysema (ICD10-J43.9). Small left adrenal adenoma. Lower lumbar facet arthropathy. Stable lucency along the left acetabular roof, probably a geode or degenerative subcortical cystic lesion, not hypermetabolic. ? ?She has been NED since her endometrial cancer surgery.  ? ?Seen in clinic on 03/11/2018 and had a negative exam. She had abdominal complaints which prompted imaging. She was supposed to see Dr. SOuida Sillsbut did not have that appointment. She would like to see one of his partners. She feels more comfortable with a female provider.  ? ?She saw Dr. WLeonides Schanz8/27/2020 with  negative exam. She continued to have abdominal complaints at that time and she was referred to GI. In the interim she has had a colonoscopy on 12/22/2019.  ? ?Last mammogram 02/17/2019 was  reported as bi-rads category 1: negative. She continues to be followed by the lung cancer screening program and is due for annual screening.  ? ?02/04/2019 ?IMPRESSION: ?1. Lung-RADS 2, benign appearance or behavio

## 2022-02-20 NOTE — Patient Instructions (Signed)
Continue to follow with your primary care provider ?

## 2022-05-16 ENCOUNTER — Other Ambulatory Visit (INDEPENDENT_AMBULATORY_CARE_PROVIDER_SITE_OTHER): Payer: Self-pay | Admitting: Nurse Practitioner

## 2022-05-16 DIAGNOSIS — G8929 Other chronic pain: Secondary | ICD-10-CM

## 2022-05-30 ENCOUNTER — Encounter (INDEPENDENT_AMBULATORY_CARE_PROVIDER_SITE_OTHER): Payer: Self-pay | Admitting: Nurse Practitioner

## 2022-05-30 ENCOUNTER — Ambulatory Visit (INDEPENDENT_AMBULATORY_CARE_PROVIDER_SITE_OTHER): Payer: Medicare HMO

## 2022-05-30 ENCOUNTER — Ambulatory Visit (INDEPENDENT_AMBULATORY_CARE_PROVIDER_SITE_OTHER): Payer: Medicare HMO | Admitting: Nurse Practitioner

## 2022-05-30 VITALS — BP 120/70 | HR 99 | Resp 17 | Ht 61.0 in | Wt 233.0 lb

## 2022-05-30 DIAGNOSIS — F17200 Nicotine dependence, unspecified, uncomplicated: Secondary | ICD-10-CM

## 2022-05-30 DIAGNOSIS — R1011 Right upper quadrant pain: Secondary | ICD-10-CM

## 2022-05-30 DIAGNOSIS — R109 Unspecified abdominal pain: Secondary | ICD-10-CM

## 2022-05-30 DIAGNOSIS — I10 Essential (primary) hypertension: Secondary | ICD-10-CM

## 2022-05-30 DIAGNOSIS — G8929 Other chronic pain: Secondary | ICD-10-CM | POA: Diagnosis not present

## 2022-06-11 ENCOUNTER — Encounter (INDEPENDENT_AMBULATORY_CARE_PROVIDER_SITE_OTHER): Payer: Self-pay | Admitting: Nurse Practitioner

## 2022-06-11 NOTE — Progress Notes (Signed)
Subjective:    Patient ID: Jessica Dalton, female    DOB: 1951-03-12, 71 y.o.   MRN: [id number] Chief Complaint  Patient presents with   Establish Care    Referred by Dr Netty Starring    Patient is a 71 year old female who presents today as a referral from her PCP in regards to abdominal pain.  This pain is chronic in nature and has been ongoing for the last 10 years.  She notes that it starts under her breastbone on the right and then it spreads down her stomach and her backside in her groins and then extends into her legs.  She notes that sometimes the site is tender.  It typically starts first thing in the morning.  It does not necessarily matter if the patient is sitting, standing or laying.  She notes that the burning and stinging is constant but it varies in intensity.  She notes that the pain is not any worse with activity, food or drinking.  She does note that at times walking can help her to feel somewhat better it is worse when she is still.  She notes that she has had back injections and this has been no help for this.  She has also had a cholecystectomy but notes that the pain was present prior to this.  The patient is also on home O2 and also has a history of neuropathy.  Today noninvasive studies show calcific aorta as well as a calcific celiac artery region.  The velocities in the common hepatic artery are elevated but it is noted that his velocities were difficult to evaluate via ultrasound.    Review of Systems  Gastrointestinal:  Positive for abdominal pain.  Musculoskeletal:  Positive for back pain.  Neurological:  Positive for numbness.  All other systems reviewed and are negative.      Objective:   Physical Exam Vitals reviewed.  HENT:     Head: Normocephalic.  Cardiovascular:     Rate and Rhythm: Normal rate.     Pulses:          Dorsalis pedis pulses are 1+ on the right side and 1+ on the left side.  Pulmonary:     Effort: Pulmonary effort is normal.  Skin:     General: Skin is warm and dry.  Neurological:     Mental Status: She is alert and oriented to person, place, and time.  Psychiatric:        Mood and Affect: Mood normal.        Behavior: Behavior normal.        Thought Content: Thought content normal.        Judgment: Judgment normal.     BP 120/70 (BP Location: Right Arm)   Pulse 99   Resp 17   Ht '5\' 1"'$  (1.549 m)   Wt 233 lb (105.7 kg)   BMI 44.02 kg/m   Past Medical History:  Diagnosis Date   A-fib (HCC)    Anemia    Asthma    Cancer (HCC)    uterine ca   Chronic cough    COPD (chronic obstructive pulmonary disease) (HCC)    Dysrhythmia    ATRIAL FIB.   Endometrial cancer ([place])    Gallstones 10/16/2016   Hypertension    Hypoxemia    Obesity    Oxygen dependent    Pneumonia    Sleep apnea    Tachycardia    Tobacco use     Social History  Socioeconomic History   Marital status: Married    Spouse name: Not on file   Number of children: Not on file   Years of education: Not on file   Highest education level: Not on file  Occupational History   Not on file  Tobacco Use   Smoking status: Every Day    Packs/day: 1.50    Years: 54.00    Total pack years: 81.00    Types: Cigarettes   Smokeless tobacco: Never   Tobacco comments:    40 pk yr - 1.5 ppd; 50 years; started age 1  Vaping Use   Vaping Use: Former  Substance and Sexual Activity   Alcohol use: No   Drug use: No   Sexual activity: Not on file  Other Topics Concern   Not on file  Social History Narrative   Not on file   Social Determinants of Health   Financial Resource Strain: Not on file  Food Insecurity: Not on file  Transportation Needs: Not on file  Physical Activity: Not on file  Stress: Not on file  Social Connections: Not on file  Intimate Partner Violence: Not on file    Past Surgical History:  Procedure Laterality Date   ABDOMINAL HYSTERECTOMY     CATARACT EXTRACTION, BILATERAL  2015   CHOLECYSTECTOMY N/A 01/23/2017    Procedure: LAPAROSCOPIC CHOLECYSTECTOMY WITH INTRAOPERATIVE CHOLANGIOGRAM;  Surgeon: Robert Bellow, MD;  Location: [facility] ORS;  Service: General;  Laterality: N/A;   COLONOSCOPY WITH PROPOFOL N/A 09/08/2017   Procedure: COLONOSCOPY WITH PROPOFOL;  Surgeon: Manya Silvas, MD;  Location: [facility] ENDOSCOPY;  Service: Endoscopy;  Laterality: N/A;   COLONOSCOPY WITH PROPOFOL N/A 12/22/2019   Procedure: COLONOSCOPY WITH PROPOFOL;  Surgeon: Toledo, Benay Pike, MD;  Location: [facility] ENDOSCOPY;  Service: Gastroenterology;  Laterality: N/A;   ESOPHAGOGASTRODUODENOSCOPY (EGD) WITH PROPOFOL N/A 09/08/2017   Procedure: ESOPHAGOGASTRODUODENOSCOPY (EGD) WITH PROPOFOL;  Surgeon: Manya Silvas, MD;  Location: [facility] ENDOSCOPY;  Service: Endoscopy;  Laterality: N/A;   EYE SURGERY     PELVIC LYMPH NODE DISSECTION N/A 10/16/2016   Procedure: PELVIC LYMPH NODE DISSECTION;  Surgeon: Gillis Ends, MD;  Location: [facility] ORS;  Service: Gynecology;  Laterality: N/A;   ROBOTIC ASSISTED TOTAL HYSTERECTOMY WITH BILATERAL SALPINGO OOPHERECTOMY Bilateral 10/16/2016   Procedure: ROBOTIC ASSISTED TOTAL HYSTERECTOMY WITH BILATERAL SALPINGO OOPHORECTOMY;  Surgeon: Gillis Ends, MD;  Location: [facility] ORS;  Service: Gynecology;  Laterality: Bilateral;   SENTINEL NODE BIOPSY N/A 10/16/2016   Procedure: SENTINEL NODE INJECTION;  Surgeon: Gillis Ends, MD;  Location: [facility] ORS;  Service: Gynecology;  Laterality: N/A;   TUBAL LIGATION      Family History  Problem Relation Age of Onset   Kidney cancer Mother 16       deceased 61   Colon cancer Father 6       deceased 60   Melanoma Sister 20       deceased 22   Uterine cancer Sister 62   Cancer Paternal Grandmother        unk. age or type   Cancer Other        maternal and paternal aunts/uncles with cancer; no specifics available   Breast cancer Paternal Aunt     Allergies  Allergen Reactions   Bismuth Subsalicylate Nausea And Vomiting        Latest Ref Rng & Units 01/15/2017    9:38 AM 11/15/2016    4:53 AM 11/14/2016    5:14 AM  CBC  WBC 3.6 - 11.0 K/uL 10.1  13.0  12.3   Hemoglobin 12.0 - 16.0 g/dL 14.0  13.6  13.1   Hematocrit 35.0 - 47.0 % 40.9  39.8  38.4   Platelets 150 - 440 K/uL 337  196  168       CMP     Component Value Date/Time   NA 141 01/15/2017 0938   NA 138 07/04/2012 1319   K 3.5 01/15/2017 0938   K 3.8 07/04/2012 1319   CL 101 01/15/2017 0938   CL 104 07/04/2012 1319   CO2 30 01/15/2017 0938   CO2 27 07/04/2012 1319   GLUCOSE 82 01/15/2017 0938   GLUCOSE 98 07/04/2012 1319   BUN 9 01/15/2017 0938   BUN 9 07/04/2012 1319   CREATININE 0.60 02/18/2020 1011   CREATININE 0.58 (L) 07/04/2012 1319   CALCIUM 9.3 01/15/2017 0938   CALCIUM 8.9 07/04/2012 1319   PROT 8.2 (H) 01/15/2017 0938   ALBUMIN 3.9 01/15/2017 0938   AST 12 (L) 01/15/2017 0938   ALT 8 (L) 01/15/2017 0938   ALKPHOS 97 01/15/2017 0938   BILITOT 0.6 01/15/2017 0938   GFRNONAA >60 09/03/2017 1203   GFRNONAA >60 07/04/2012 1319   GFRAA >60 09/03/2017 1203   GFRAA >60 07/04/2012 1319     No results found.     Assessment & Plan:   1. Right upper quadrant abdominal pain Today the patient's ultrasound did show evidence of elevated velocities at the common hepatic artery however it was very difficult to evaluate with ultrasound.  Other mesenteric arteries appear normal in finding.  There is also some mild atherosclerotic disease in the abdominal aorta.  Given the patient's pain symptoms, the ultrasound is unable to fully rule out possible mesenteric ischemia.  We will have the patient undergo a CT angiogram to better evaluate for possible causes of her continued stomach pain. - CT Angio Abd/Pel w/ and/or w/o; Future  2. Essential hypertension Continue antihypertensive medications as already ordered, these medications have been reviewed and there are no changes at this time.   3. Tobacco dependence Smoking cessation was discussed,  3-10 minutes spent on this topic specifically    Current Outpatient Medications on File Prior to Visit  Medication Sig Dispense Refill   apixaban (ELIQUIS) 5 MG TABS tablet Take 5 mg by mouth 2 (two) times daily.     aspirin EC 81 MG EC tablet Take 1 tablet (81 mg total) by mouth daily.     diltiazem (TIAZAC) 120 MG 24 hr capsule      DULoxetine (CYMBALTA) 60 MG capsule Take 1 capsule daily along with a '30mg'$  capsule of duloxetine daily     furosemide (LASIX) 20 MG tablet Take 1 tablet (20 mg total) by mouth 2 (two) times daily. 60 tablet 0   gabapentin (NEURONTIN) 300 MG capsule Take by mouth.     hydrochlorothiazide (HYDRODIURIL) 12.5 MG tablet      methimazole (TAPAZOLE) 5 MG tablet Take by mouth.     predniSONE (DELTASONE) 5 MG tablet Take 5 mg by mouth daily.     rosuvastatin (CRESTOR) 5 MG tablet Take 1 tablet by mouth at bedtime.     sulfamethoxazole-trimethoprim (BACTRIM) 400-80 MG tablet Take 1 tablet by mouth 3 (three) times a week.     DULoxetine (CYMBALTA) 30 MG capsule Take by mouth.     No current facility-administered medications on file prior to visit.    There are no Patient Instructions on file for this  visit. No follow-ups on file.   Kris Hartmann, NP

## 2022-06-12 ENCOUNTER — Telehealth (INDEPENDENT_AMBULATORY_CARE_PROVIDER_SITE_OTHER): Payer: Self-pay | Admitting: Nurse Practitioner

## 2022-06-12 NOTE — Telephone Encounter (Signed)
LVM for pt advising that I had received a PA for the CT that Eulogio Ditch, NP has ordered. I advised pt to call Radiology scheduling at [phone] and make the appt and then call us at the office to make a CT results follow up appt.. Nothing further needed at this time.

## 2022-06-21 ENCOUNTER — Ambulatory Visit
Admission: RE | Admit: 2022-06-21 | Discharge: 2022-06-21 | Disposition: A | Discharge status: Home / Self Care | Payer: Medicare HMO | Source: Ambulatory Visit | Attending: Nurse Practitioner | Admitting: Nurse Practitioner

## 2022-06-21 DIAGNOSIS — R1011 Right upper quadrant pain: Secondary | ICD-10-CM | POA: Diagnosis not present

## 2022-06-21 LAB — POCT I-STAT CREATININE: Creatinine, Ser: 0.6 mg/dL (ref 0.44–1.00)

## 2022-06-21 MED ORDER — IOHEXOL 350 MG/ML SOLN
100.0000 mL | Freq: Once | INTRAVENOUS | Status: AC | PRN
Start: 2022-06-21 — End: 2022-06-21
  Administered 2022-06-21: 100 mL via INTRAVENOUS

## 2022-07-11 ENCOUNTER — Ambulatory Visit (INDEPENDENT_AMBULATORY_CARE_PROVIDER_SITE_OTHER): Payer: Medicare HMO | Admitting: Vascular Surgery

## 2022-07-11 ENCOUNTER — Encounter (INDEPENDENT_AMBULATORY_CARE_PROVIDER_SITE_OTHER): Payer: Self-pay | Admitting: Vascular Surgery

## 2022-07-11 VITALS — BP 120/61 | HR 97 | Resp 16 | Ht 61.0 in | Wt 230.0 lb

## 2022-07-11 DIAGNOSIS — M79604 Pain in right leg: Secondary | ICD-10-CM | POA: Diagnosis not present

## 2022-07-11 DIAGNOSIS — I2510 Atherosclerotic heart disease of native coronary artery without angina pectoris: Secondary | ICD-10-CM | POA: Diagnosis not present

## 2022-07-11 DIAGNOSIS — I739 Peripheral vascular disease, unspecified: Secondary | ICD-10-CM | POA: Diagnosis not present

## 2022-07-11 DIAGNOSIS — I10 Essential (primary) hypertension: Secondary | ICD-10-CM

## 2022-07-11 DIAGNOSIS — I890 Lymphedema, not elsewhere classified: Secondary | ICD-10-CM | POA: Diagnosis not present

## 2022-07-11 DIAGNOSIS — K639 Disease of intestine, unspecified: Secondary | ICD-10-CM | POA: Insufficient documentation

## 2022-07-11 DIAGNOSIS — I480 Paroxysmal atrial fibrillation: Secondary | ICD-10-CM

## 2022-07-11 DIAGNOSIS — M79605 Pain in left leg: Secondary | ICD-10-CM

## 2022-07-11 NOTE — Progress Notes (Signed)
MRN : [id number]  Jessica Dalton is a 71 y.o. ([birth year]) female who presents with chief complaint of check circulation.  History of Present Illness:   The patient is seen for evaluation of painful lower extremities.  She describes the pain as beginning in her lower back and then radiating through the buttock and down the back of both legs.  Patient notes the pain is variable and not always associated with activity.  The pain is somewhat consistent day to day occurring on most days. The patient notes the pain also occurs with standing as well as with laying flat on her back and routinely seems worse as the day wears on. The pain has been progressive over the past several years. The patient states these symptoms are causing  a negative impact on quality of life and daily activities which was a factor in the referral.  The patient denies a significant history of back problems or DJD of the lumbar and sacral spine.  But review of the chart shows that she has seen Dr. Cari Caraway in the past and has had cervical thoracic and lumbar sacral MRIs  The patient denies rest pain or dangling of an extremity off the side of the bed during the night for relief. No open wounds or sores at this time. No history of DVT or phlebitis. No prior vascular interventions or surgeries.   CT angio of the abdomen and pelvis dated 06/21/2022 is reviewed by me personally.  Significant findings include no evidence for mesenteric ischemia the SMA, celiac artery and IMA are all widely patent.  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  Incidental finding of an area of thickening of the sigmoid colon.  Current Meds  Medication Sig   apixaban (ELIQUIS) 5 MG TABS tablet Take 5 mg by mouth 2 (two) times daily.   aspirin EC 81 MG EC tablet Take 1 tablet (81 mg total) by mouth daily.   diltiazem (CARDIZEM CD) 120 MG 24 hr capsule Take 120  mg by mouth daily.   diltiazem (TIAZAC) 120 MG 24 hr capsule    DULoxetine (CYMBALTA) 60 MG capsule Take 1 capsule daily along with a '30mg'$  capsule of duloxetine daily   furosemide (LASIX) 20 MG tablet Take 1 tablet (20 mg total) by mouth 2 (two) times daily.   gabapentin (NEURONTIN) 300 MG capsule Take by mouth.   hydrochlorothiazide (HYDRODIURIL) 12.5 MG tablet    HYDROcodone-acetaminophen (NORCO) 5-325 MG tablet Take 1 tablet every 3-4 hours by oral route as needed.   methimazole (TAPAZOLE) 5 MG tablet Take by mouth.   predniSONE (DELTASONE) 5 MG tablet Take 5 mg by mouth daily.   rosuvastatin (CRESTOR) 5 MG tablet Take 1 tablet by mouth at bedtime.   sulfamethoxazole-trimethoprim (BACTRIM) 400-80 MG tablet Take 1 tablet by mouth 3 (three) times a week.    Past Medical History:  Diagnosis Date   A-fib ([place])    Anemia    Asthma    Cancer ([place])    uterine ca   Chronic cough    COPD (chronic obstructive pulmonary disease) (HCC)    Dysrhythmia    ATRIAL FIB.   Endometrial cancer ([place])    Gallstones 10/16/2016   Hypertension    Hypoxemia    Obesity    Oxygen dependent    Pneumonia    Sleep apnea  Tachycardia    Tobacco use     Past Surgical History:  Procedure Laterality Date   ABDOMINAL HYSTERECTOMY     CATARACT EXTRACTION, BILATERAL  2015   CHOLECYSTECTOMY N/A 01/23/2017   Procedure: LAPAROSCOPIC CHOLECYSTECTOMY WITH INTRAOPERATIVE CHOLANGIOGRAM;  Surgeon: Robert Bellow, MD;  Location: [facility] ORS;  Service: General;  Laterality: N/A;   COLONOSCOPY WITH PROPOFOL N/A 09/08/2017   Procedure: COLONOSCOPY WITH PROPOFOL;  Surgeon: Manya Silvas, MD;  Location: [facility] ENDOSCOPY;  Service: Endoscopy;  Laterality: N/A;   COLONOSCOPY WITH PROPOFOL N/A 12/22/2019   Procedure: COLONOSCOPY WITH PROPOFOL;  Surgeon: Toledo, Benay Pike, MD;  Location: [facility] ENDOSCOPY;  Service: Gastroenterology;  Laterality: N/A;   ESOPHAGOGASTRODUODENOSCOPY (EGD) WITH PROPOFOL N/A 09/08/2017    Procedure: ESOPHAGOGASTRODUODENOSCOPY (EGD) WITH PROPOFOL;  Surgeon: Manya Silvas, MD;  Location: [facility] ENDOSCOPY;  Service: Endoscopy;  Laterality: N/A;   EYE SURGERY     PELVIC LYMPH NODE DISSECTION N/A 10/16/2016   Procedure: PELVIC LYMPH NODE DISSECTION;  Surgeon: Gillis Ends, MD;  Location: [facility] ORS;  Service: Gynecology;  Laterality: N/A;   ROBOTIC ASSISTED TOTAL HYSTERECTOMY WITH BILATERAL SALPINGO OOPHERECTOMY Bilateral 10/16/2016   Procedure: ROBOTIC ASSISTED TOTAL HYSTERECTOMY WITH BILATERAL SALPINGO OOPHORECTOMY;  Surgeon: Gillis Ends, MD;  Location: [facility] ORS;  Service: Gynecology;  Laterality: Bilateral;   SENTINEL NODE BIOPSY N/A 10/16/2016   Procedure: SENTINEL NODE INJECTION;  Surgeon: Gillis Ends, MD;  Location: [facility] ORS;  Service: Gynecology;  Laterality: N/A;   TUBAL LIGATION      Social History Social History   Tobacco Use   Smoking status: Every Day    Packs/day: 1.50    Years: 54.00    Total pack years: 81.00    Types: Cigarettes   Smokeless tobacco: Never   Tobacco comments:    37 pk yr - 1.5 ppd; 89 years; started age 44  Vaping Use   Vaping Use: Former  Substance Use Topics   Alcohol use: No   Drug use: No    Family History Family History  Problem Relation Age of Onset   Kidney cancer Mother 25       deceased 55   Colon cancer Father 75       deceased 96   Melanoma Sister 1       deceased 55   Uterine cancer Sister 64   Cancer Paternal Grandmother        unk. age or type   Cancer Other        maternal and paternal aunts/uncles with cancer; no specifics available   Breast cancer Paternal Aunt     Allergies  Allergen Reactions   Bismuth Subsalicylate Nausea And Vomiting     REVIEW OF SYSTEMS (Negative unless checked)  Constitutional: '[]'$ Weight loss  '[]'$ Fever  '[]'$ Chills Cardiac: '[]'$ Chest pain   '[]'$ Chest pressure   '[]'$ Palpitations   '[]'$ Shortness of breath when laying flat   '[]'$ Shortness of breath with  exertion. Vascular:  '[x]'$ Pain in legs with walking   '[]'$ Pain in legs at rest  '[]'$ History of DVT   '[]'$ Phlebitis   '[]'$ Swelling in legs   '[]'$ Varicose veins   '[]'$ Non-healing ulcers Pulmonary:   '[]'$ Uses home oxygen   '[]'$ Productive cough   '[]'$ Hemoptysis   '[]'$ Wheeze  '[]'$ COPD   '[]'$ Asthma Neurologic:  '[]'$ Dizziness   '[]'$ Seizures   '[]'$ History of stroke   '[]'$ History of TIA  '[]'$ Aphasia   '[]'$ Vissual changes   '[]'$ Weakness or numbness in arm   '[x]'$ Weakness or numbness in leg Musculoskeletal:   '[]'$ Joint  swelling   '[]'$ Joint pain   '[x]'$ Low back pain Hematologic:  '[]'$ Easy bruising  '[]'$ Easy bleeding   '[]'$ Hypercoagulable state   '[]'$ Anemic Gastrointestinal:  '[]'$ Diarrhea   '[]'$ Vomiting  '[]'$ Gastroesophageal reflux/heartburn   '[]'$ Difficulty swallowing. Genitourinary:  '[]'$ Chronic kidney disease   '[]'$ Difficult urination  '[]'$ Frequent urination   '[]'$ Blood in urine Skin:  '[]'$ Rashes   '[]'$ Ulcers  Psychological:  '[]'$ History of anxiety   '[]'$  History of major depression.  Physical Examination  Vitals:   07/11/22 0955  BP: 120/61  Pulse: 97  Resp: 16  Weight: 230 lb (104.3 kg)  Height: '5\' 1"'$  (1.549 m)   Body mass index is 43.46 kg/m. Gen: WD/WN, NAD Head: [place]/AT, No temporalis wasting.  Ear/Nose/Throat: Hearing grossly intact, nares w/o erythema or drainage Eyes: PER, EOMI, sclera nonicteric.  Neck: Supple, no masses.  No bruit or JVD.  Pulmonary:  Good air movement, no audible wheezing, no use of accessory muscles.  Cardiac: RRR, normal S1, S2, no Murmurs. Vascular: Moderate woody edema both lower extremities  Vessel Right Left  Radial Palpable Palpable  PT Not Palpable Not Palpable  DP Not Palpable Not Palpable  Gastrointestinal: soft, non-distended. No guarding/no peritoneal signs.  Musculoskeletal: M/S 5/5 throughout.  No visible deformity.  Neurologic: CN 2-12 intact. Pain and light touch intact in extremities.  Symmetrical.  Speech is fluent. Motor exam as listed above. Psychiatric: Judgment intact, Mood & affect appropriate for pt's clinical  situation. Dermatologic: No rashes or ulcers noted.  No changes consistent with cellulitis.   CBC Lab Results  Component Value Date   WBC 10.1 01/15/2017   HGB 14.0 01/15/2017   HCT 40.9 01/15/2017   MCV 87.8 01/15/2017   PLT 337 01/15/2017    BMET    Component Value Date/Time   NA 141 01/15/2017 0938   NA 138 07/04/2012 1319   K 3.5 01/15/2017 0938   K 3.8 07/04/2012 1319   CL 101 01/15/2017 0938   CL 104 07/04/2012 1319   CO2 30 01/15/2017 0938   CO2 27 07/04/2012 1319   GLUCOSE 82 01/15/2017 0938   GLUCOSE 98 07/04/2012 1319   BUN 9 01/15/2017 0938   BUN 9 07/04/2012 1319   CREATININE 0.60 06/21/2022 0928   CREATININE 0.58 (L) 07/04/2012 1319   CALCIUM 9.3 01/15/2017 0938   CALCIUM 8.9 07/04/2012 1319   GFRNONAA >60 09/03/2017 1203   GFRNONAA >60 07/04/2012 1319   GFRAA >60 09/03/2017 1203   GFRAA >60 07/04/2012 1319   Estimated Creatinine Clearance: 71.7 mL/min (by C-G formula based on SCr of 0.6 mg/dL).  COAG No results found for: "INR", "PROTIME"  Radiology CT Angio Abd/Pel w/ and/or w/o  Result Date: 06/22/2022 CLINICAL DATA:  Chronic mesenteric ischemia EXAM: CTA ABDOMEN AND PELVIS WITHOUT AND WITH CONTRAST TECHNIQUE: Multidetector CT imaging of the abdomen and pelvis was performed using the standard protocol during bolus administration of intravenous contrast. Multiplanar reconstructed images and MIPs were obtained and reviewed to evaluate the vascular anatomy. RADIATION DOSE REDUCTION: This exam was performed according to the departmental dose-optimization program which includes automated exposure control, adjustment of the mA and/or kV according to patient size and/or use of iterative reconstruction technique. CONTRAST:  13m OMNIPAQUE IOHEXOL 350 MG/ML SOLN COMPARISON:  Prior CT scan of the chest 04/04/2020; prior PET-CT 04/11/2020 FINDINGS: VASCULAR Aorta: Atherosclerotic plaque throughout the abdominal aorta. No evidence of aneurysm or dissection. No  significant flow limiting stenosis. Celiac: Patent without evidence of aneurysm, dissection, vasculitis or significant stenosis. SMA: Patent without evidence of aneurysm,  dissection, vasculitis or significant stenosis. Renals: Single renal arteries bilaterally. Fibrofatty atherosclerotic plaque results in at least moderate stenosis at the origin of the right renal artery. The left renal artery demonstrates no significant stenosis. No changes of fibromuscular dysplasia. IMA: Patent without evidence of aneurysm, dissection, vasculitis or significant stenosis. Inflow: Focal web-like stenosis at the origin of the right common iliac artery appears to result in a hemodynamically significant stenosis. The remainder of the right iliac system is widely patent. No significant stenosis or occlusion in the left iliac system. Proximal Outflow: Bilateral common femoral and visualized portions of the superficial and profunda femoral arteries are patent without evidence of aneurysm, dissection, vasculitis or significant stenosis. Veins: No focal venous abnormality. Review of the MIP images confirms the above findings. NON-VASCULAR Lower chest: Stable 1 cm left lower lobe pulmonary nodule. Greater than 2 year stability is consistent with benignity. Stable 3 mm pulmonary nodule in the periphery of the right lower lobe, also benign. Mild centrilobular pulmonary emphysema and chronic bronchial wall thickening. No acute abnormality. Hepatobiliary: Morphologic changes of the liver (atrophy of the right hepatic lobe, hypertrophy of the left hepatic lobe with blunting of the free liver edge suggest mild cirrhotic change. No focal liver lesion. Status post cholecystectomy. No biliary dilatation. Pancreas: Unremarkable. No pancreatic ductal dilatation or surrounding inflammatory changes. Spleen: No splenic injury or perisplenic hematoma. Adrenals/Urinary Tract: Adrenal glands are unremarkable. Kidneys are normal, without renal calculi, focal  lesion, or hydronephrosis. Bladder is unremarkable. Stomach/Bowel: Region of focal bowel irregular wall thickening in the redundant sigmoid colon (images 127-134 of series 4). Colonic diverticular disease without CT evidence of active inflammation. The terminal ileum is unremarkable. The appendix is not visualized and may be surgically absent. No focal bowel obstruction. Unremarkable appearance of the stomach and proximal duodenum. Lymphatic: No suspicious lymphadenopathy. Reproductive: Status post hysterectomy. No adnexal masses. Other: Pelvic floor laxity.  No abdominal wall hernia or ascites. Musculoskeletal: No acute fracture or aggressive appearing lytic or blastic osseous lesion. Multilevel degenerative disc disease. IMPRESSION: VASCULAR 1. No evidence of visceral artery stenosis or occlusion that would provide an etiology for chronic mesenteric ischemia. 2. Focal moderate stenosis at the origin of the right renal artery. 3. Web-like moderate stenosis at the origin of the right common iliac artery. 4.  Aortic Atherosclerosis (ICD10-I70.0). NON-VASCULAR 1. Region of irregular appearing circumferential wall thickening in the redundant sigmoid colon concerning for a possible colonic adenocarcinoma. Alternately, this could represent a region of wall adherent fecal material in a region of active peristalsis. Recommend further evaluation with colonoscopy to exclude malignancy if not recently performed. 2. Morphologic changes of the liver suggest early cirrhotic change. 3. Pelvic floor laxity. 4. Diverticulosis without evidence of active diverticulitis. 5. Multilevel lumbar degenerative disc disease. These results will be called to the ordering clinician or representative by the Radiologist Assistant, and communication documented in the PACS or [employer]. Signed, Criselda Peaches, MD, [facility] Electronically Signed   By: Jacqulynn Cadet  M.D.   On: 06/22/2022 06:39     Assessment/Plan 1. Pain in both lower extremities Recommend:  The patient has atypical pain symptoms for vascular disease and on exam I do not find evidence of vascular pathology that would explain the patient's symptoms.  Physical exam as well as her history does not support vascular problems as a possible explanation for her symptoms  I suspect the patient is c/o pseudoclaudication.  Patient should have an evaluation of the  LS spine which I defer to the primary service or the Spine service.  The patient should continue walking and begin a more formal exercise program. The patient should continue his antiplatelet therapy and aggressive treatment of the lipid abnormalities.   2. PAD (peripheral artery disease) (HCC) Recommend:  I do not find evidence of life style limiting vascular disease. The patient specifically denies life style limitation.  Previous noninvasive studies including ABI's of the legs do not identify critical vascular problems.  The patient should continue walking and begin a more formal exercise program. The patient should continue his antiplatelet therapy and aggressive treatment of the lipid abnormalities.  The patient is instructed to call the office if there is a significant change in the lower extremity symptoms, particularly if a wound develops or there is an abrupt increase in leg pain.  - VAS [place] ABI WITH/WO TBI; Future  3. Lymphedema Recommend:  I suspect her pulse examination would be much improved were it not for the hard woody edema of the ankles making palpation of pulses much more difficult.  I have had a long discussion with the patient regarding swelling and why it  causes symptoms.  Patient will begin wearing graduated compression on a daily basis a prescription was given. The patient will  wear the stockings first thing in the morning and removing them in the evening. The patient is instructed specifically not to  sleep in the stockings.   In addition, behavioral modification will be initiated.  This will include frequent elevation, use of over the counter pain medications and exercise such as walking.  Consideration for a lymph pump will also be made based upon the effectiveness of conservative therapy.  This would help to improve the edema control and prevent sequela such as ulcers and infections   Patient should undergo duplex ultrasound of the venous system to ensure that DVT or reflux is not present.  The patient will follow-up with me after the ultrasound.    4. Abnormality of colon She has seen [facility] gastroenterology in the past and I will ask them to evaluate the CT findings and schedule an appointment with her pending their assessment.  5. Nonocclusive coronary atherosclerosis of native coronary artery Continue cardiac and antihypertensive medications as already ordered and reviewed, no changes at this time.  Continue statin as ordered and reviewed, no changes at this time  Nitrates PRN for chest pain   6. Essential hypertension Continue antihypertensive medications as already ordered, these medications have been reviewed and there are no changes at this time.   7. Paroxysmal atrial fibrillation (HCC) Continue antiarrhythmia medications as already ordered, these medications have been reviewed and there are no changes at this time.  Continue anticoagulation as ordered by Cardiology Service     Hortencia Pilar, MD  07/11/2022 10:03 AM

## 2022-07-12 ENCOUNTER — Other Ambulatory Visit: Payer: Self-pay | Admitting: Family Medicine

## 2022-07-12 DIAGNOSIS — M79606 Pain in leg, unspecified: Secondary | ICD-10-CM | POA: Insufficient documentation

## 2022-07-12 DIAGNOSIS — I890 Lymphedema, not elsewhere classified: Secondary | ICD-10-CM | POA: Insufficient documentation

## 2022-07-12 DIAGNOSIS — Z1231 Encounter for screening mammogram for malignant neoplasm of breast: Secondary | ICD-10-CM

## 2022-08-23 ENCOUNTER — Ambulatory Visit
Admission: RE | Admit: 2022-08-23 | Discharge: 2022-08-23 | Disposition: A | Discharge status: Home / Self Care | Payer: Medicare HMO | Source: Ambulatory Visit | Attending: Family Medicine | Admitting: Family Medicine

## 2022-08-23 DIAGNOSIS — Z1231 Encounter for screening mammogram for malignant neoplasm of breast: Secondary | ICD-10-CM | POA: Insufficient documentation

## 2022-08-27 ENCOUNTER — Encounter
Admission: RE | Disposition: A | Discharge status: Home / Self Care | Payer: Self-pay | Source: Home / Self Care | Attending: Gastroenterology

## 2022-08-27 ENCOUNTER — Ambulatory Visit: Payer: Medicare HMO | Admitting: Anesthesiology

## 2022-08-27 ENCOUNTER — Encounter: Payer: Self-pay | Admitting: *Deleted

## 2022-08-27 ENCOUNTER — Ambulatory Visit
Admission: RE | Admit: 2022-08-27 | Discharge: 2022-08-27 | Disposition: A | Discharge status: Home / Self Care | Payer: Medicare HMO | Attending: Gastroenterology | Admitting: Gastroenterology

## 2022-08-27 ENCOUNTER — Other Ambulatory Visit: Payer: Self-pay

## 2022-08-27 DIAGNOSIS — G4730 Sleep apnea, unspecified: Secondary | ICD-10-CM | POA: Insufficient documentation

## 2022-08-27 DIAGNOSIS — Z6841 Body Mass Index (BMI) 40.0 and over, adult: Secondary | ICD-10-CM | POA: Diagnosis not present

## 2022-08-27 DIAGNOSIS — K640 First degree hemorrhoids: Secondary | ICD-10-CM | POA: Insufficient documentation

## 2022-08-27 DIAGNOSIS — G629 Polyneuropathy, unspecified: Secondary | ICD-10-CM | POA: Insufficient documentation

## 2022-08-27 DIAGNOSIS — I2510 Atherosclerotic heart disease of native coronary artery without angina pectoris: Secondary | ICD-10-CM | POA: Diagnosis not present

## 2022-08-27 DIAGNOSIS — I10 Essential (primary) hypertension: Secondary | ICD-10-CM | POA: Insufficient documentation

## 2022-08-27 DIAGNOSIS — E0590 Thyrotoxicosis, unspecified without thyrotoxic crisis or storm: Secondary | ICD-10-CM | POA: Diagnosis not present

## 2022-08-27 DIAGNOSIS — Z800 Family history of malignant neoplasm of digestive organs: Secondary | ICD-10-CM | POA: Insufficient documentation

## 2022-08-27 DIAGNOSIS — I739 Peripheral vascular disease, unspecified: Secondary | ICD-10-CM | POA: Diagnosis not present

## 2022-08-27 DIAGNOSIS — Z9981 Dependence on supplemental oxygen: Secondary | ICD-10-CM | POA: Diagnosis not present

## 2022-08-27 DIAGNOSIS — I4891 Unspecified atrial fibrillation: Secondary | ICD-10-CM | POA: Insufficient documentation

## 2022-08-27 DIAGNOSIS — K5730 Diverticulosis of large intestine without perforation or abscess without bleeding: Secondary | ICD-10-CM | POA: Diagnosis not present

## 2022-08-27 DIAGNOSIS — J449 Chronic obstructive pulmonary disease, unspecified: Secondary | ICD-10-CM | POA: Insufficient documentation

## 2022-08-27 DIAGNOSIS — R109 Unspecified abdominal pain: Secondary | ICD-10-CM | POA: Diagnosis not present

## 2022-08-27 DIAGNOSIS — F17200 Nicotine dependence, unspecified, uncomplicated: Secondary | ICD-10-CM | POA: Insufficient documentation

## 2022-08-27 DIAGNOSIS — R933 Abnormal findings on diagnostic imaging of other parts of digestive tract: Secondary | ICD-10-CM | POA: Diagnosis present

## 2022-08-27 DIAGNOSIS — D123 Benign neoplasm of transverse colon: Secondary | ICD-10-CM | POA: Diagnosis not present

## 2022-08-27 DIAGNOSIS — Q438 Other specified congenital malformations of intestine: Secondary | ICD-10-CM | POA: Diagnosis not present

## 2022-08-27 HISTORY — PX: COLONOSCOPY WITH PROPOFOL: SHX5780

## 2022-08-27 SURGERY — COLONOSCOPY WITH PROPOFOL
Anesthesia: General

## 2022-08-27 MED ORDER — SODIUM CHLORIDE 0.9 % IV SOLN: INTRAVENOUS | Status: DC

## 2022-08-27 MED ORDER — PHENYLEPHRINE HCL (PRESSORS) 10 MG/ML IV SOLN
INTRAVENOUS | Status: DC | PRN
  Administered 2022-08-27 (×3): 80 ug via INTRAVENOUS
  Administered 2022-08-27: 160 ug via INTRAVENOUS

## 2022-08-27 MED ORDER — LIDOCAINE HCL (CARDIAC) PF 100 MG/5ML IV SOSY
PREFILLED_SYRINGE | INTRAVENOUS | Status: DC | PRN
  Administered 2022-08-27: 80 mg via INTRAVENOUS

## 2022-08-27 MED ORDER — PROPOFOL 1000 MG/100ML IV EMUL
INTRAVENOUS | Status: AC
  Filled 2022-08-27: qty 100

## 2022-08-27 MED ORDER — PROPOFOL 10 MG/ML IV BOLUS
INTRAVENOUS | Status: DC | PRN
  Administered 2022-08-27 (×2): 20 mg via INTRAVENOUS

## 2022-08-27 MED ORDER — LIDOCAINE HCL (PF) 2 % IJ SOLN
INTRAMUSCULAR | Status: AC
  Filled 2022-08-27: qty 5

## 2022-08-27 MED ORDER — PROPOFOL 500 MG/50ML IV EMUL
INTRAVENOUS | Status: DC | PRN
  Administered 2022-08-27: 50 ug/kg/min via INTRAVENOUS

## 2022-08-27 MED ORDER — PHENYLEPHRINE 80 MCG/ML (10ML) SYRINGE FOR IV PUSH (FOR BLOOD PRESSURE SUPPORT)
PREFILLED_SYRINGE | INTRAVENOUS | Status: AC
  Filled 2022-08-27: qty 10

## 2022-08-27 NOTE — Transfer of Care (Signed)
Immediate Anesthesia Transfer of Care Note  Patient: Jessica Dalton  Procedure(s) Performed: COLONOSCOPY WITH PROPOFOL  Patient Location: PACU  Anesthesia Type:General  Level of Consciousness: sedated  Airway & Oxygen Therapy: Patient Spontanous Breathing and Patient connected to nasal cannula oxygen  Post-op Assessment: Report given to RN and Post -op Vital signs reviewed and stable  Post vital signs: Reviewed and stable  Last Vitals:  Vitals Value Taken Time  BP 96/49 08/27/22 1132  Temp    Pulse 66 08/27/22 1133  Resp 18 08/27/22 1133  SpO2 100 % 08/27/22 1133  Vitals shown include unvalidated device data.  Last Pain:  Vitals:   08/27/22 1131  TempSrc:   PainSc: 0-No pain         Complications: No notable events documented.

## 2022-08-27 NOTE — Anesthesia Postprocedure Evaluation (Signed)
Anesthesia Post Note  Patient: Jessica Dalton  Procedure(s) Performed: COLONOSCOPY WITH PROPOFOL  Patient location during evaluation: Endoscopy Anesthesia Type: General Level of consciousness: awake and alert Pain management: pain level controlled Vital Signs Assessment: post-procedure vital signs reviewed and stable Respiratory status: spontaneous breathing, nonlabored ventilation, respiratory function stable and patient connected to nasal cannula oxygen Cardiovascular status: blood pressure returned to baseline and stable Postop Assessment: no apparent nausea or vomiting Anesthetic complications: no   No notable events documented.   Last Vitals:  Vitals:   08/27/22 1042  BP: (!) 144/70  Pulse: 65  Resp: (!) 25  Temp: (!) 35.9 C  SpO2: 99%    Last Pain:  Vitals:   08/27/22 1144  TempSrc:   PainSc: 0-No pain                 Ilene Qua

## 2022-08-27 NOTE — Op Note (Signed)
[facility] Gastroenterology Patient Name: Jessica Dalton Procedure Date: 08/27/2022 10:44 AM MRN: [id number] Account #: [id number] Date of Birth: Jun 12, 1951 Admit Type: Outpatient Age: 71 Room: [facility] ENDO ROOM 1 Gender: Female Note Status: Finalized Instrument Name: Jasper Riling [id number] Procedure:             Colonoscopy Indications:           Abnormal CT of the GI tract Providers:             Andrey Farmer MD, MD Referring MD:          Andrey Farmer MD, MD (Referring MD), Dion Body (Referring MD) Medicines:             Monitored Anesthesia Care Complications:         No immediate complications. Estimated blood loss:                         Minimal. Procedure:             Pre-Anesthesia Assessment:                        - Prior to the procedure, a History and Physical was                         performed, and patient medications and allergies were                         reviewed. The patient is competent. The risks and                         benefits of the procedure and the sedation options and                         risks were discussed with the patient. All questions                         were answered and informed consent was obtained.                         Patient identification and proposed procedure were                         verified by the physician, the nurse, the                         anesthesiologist, the anesthetist and the technician                         in the endoscopy suite. Mental Status Examination:                         alert and oriented. Airway Examination: normal                         oropharyngeal airway and neck mobility. Respiratory  Examination: clear to auscultation. CV Examination:                         normal. Prophylactic Antibiotics: The patient does not                         require prophylactic antibiotics. Prior                          Anticoagulants: The patient has taken no previous                         anticoagulant or antiplatelet agents. ASA Grade                         Assessment: III - A patient with severe systemic                         disease. After reviewing the risks and benefits, the                         patient was deemed in satisfactory condition to                         undergo the procedure. The anesthesia plan was to use                         monitored anesthesia care (MAC). Immediately prior to                         administration of medications, the patient was                         re-assessed for adequacy to receive sedatives. The                         heart rate, respiratory rate, oxygen saturations,                         blood pressure, adequacy of pulmonary ventilation, and                         response to care were monitored throughout the                         procedure. The physical status of the patient was                         re-assessed after the procedure.                        After obtaining informed consent, the colonoscope was                         passed under direct vision. Throughout the procedure,                         the patient's blood pressure, pulse, and oxygen  saturations were monitored continuously. The                         Colonoscope was introduced through the anus and                         advanced to the the cecum, identified by appendiceal                         orifice and ileocecal valve. The colonoscopy was                         somewhat difficult due to a redundant colon. The                         patient tolerated the procedure well. The quality of                         the bowel preparation was adequate to identify polyps. Findings:      The perianal and digital rectal examinations were normal.      A 1 mm polyp was found in the transverse colon. The polyp was sessile.       The polyp was  removed with a jumbo cold forceps. Resection and retrieval       were complete. Estimated blood loss was minimal.      A 5 mm polyp was found in the transverse colon. The polyp was sessile.       The polyp was removed with a cold snare. Resection and retrieval were       complete. Estimated blood loss was minimal.      A few small-mouthed diverticula were found in the sigmoid colon.      Internal hemorrhoids were found during retroflexion. The hemorrhoids       were Grade I (internal hemorrhoids that do not prolapse).      The exam was otherwise without abnormality on direct and retroflexion       views. Impression:            - One 1 mm polyp in the transverse colon, removed with                         a jumbo cold forceps. Resected and retrieved.                        - One 5 mm polyp in the transverse colon, removed with                         a cold snare. Resected and retrieved.                        - Diverticulosis in the sigmoid colon.                        - Internal hemorrhoids.                        - The examination was otherwise normal on direct and  retroflexion views. Recommendation:        - Discharge patient to home.                        - Resume previous diet.                        - Continue present medications.                        - Await pathology results.                        - Repeat colonoscopy in 3 - 5 years for surveillance.                        - Return to referring physician as previously                         scheduled. Procedure Code(s):     --- Professional ---                        [phone], Colonoscopy, flexible; with removal of                         tumor(s), polyp(s), or other lesion(s) by snare                         technique                        [id number], 65, Colonoscopy, flexible; with biopsy, single                         or multiple Diagnosis Code(s):     --- Professional ---                         K63.5, Polyp of colon                        K64.0, First degree hemorrhoids                        K57.30, Diverticulosis of large intestine without                         perforation or abscess without bleeding                        R93.3, Abnormal findings on diagnostic imaging of                         other parts of digestive tract CPT copyright 2019 American Medical Association. All rights reserved. The codes documented in this report are preliminary and upon coder review may  be revised to meet current compliance requirements. Andrey Farmer MD, MD 08/27/2022 11:36:44 AM Number of Addenda: 0 Note Initiated On: 08/27/2022 10:44 AM Scope Withdrawal Time: 0 hours 10 minutes 16 seconds  Total Procedure Duration: 0 hours 17 minutes 26 seconds  Estimated Blood Loss:  Estimated blood loss was minimal.      [facility]

## 2022-08-27 NOTE — H&P (Signed)
Outpatient short stay form Pre-procedure 08/27/2022  Lesly Rubenstein, MD  Primary Physician: Dion Body, MD  Reason for visit:  Abnormal imaging  History of present illness:    71 y/o lady with COPD, a. Fib on DOAC with last dose 3 days ago, and hypertension here for colonoscopy due to abdominal pain and abnormal imaging. History of cholecystectomy and hysterectomy. Father with colon cancer in his 72's.    Current Facility-Administered Medications:    0.9 %  sodium chloride infusion, , Intravenous, Continuous, Ilhan Madan, Hilton Cork, MD  Medications Prior to Admission  Medication Sig Dispense Refill Last Dose   apixaban (ELIQUIS) 5 MG TABS tablet Take 5 mg by mouth 2 (two) times daily.   08/23/2022   aspirin EC 81 MG EC tablet Take 1 tablet (81 mg total) by mouth daily.   08/26/2022   diltiazem (CARDIZEM CD) 120 MG 24 hr capsule Take 120 mg by mouth daily.   08/26/2022   diltiazem (TIAZAC) 120 MG 24 hr capsule    08/26/2022   DULoxetine (CYMBALTA) 60 MG capsule Take 1 capsule daily along with a '30mg'$  capsule of duloxetine daily   08/26/2022   furosemide (LASIX) 20 MG tablet Take 1 tablet (20 mg total) by mouth 2 (two) times daily. 60 tablet 0 08/26/2022   gabapentin (NEURONTIN) 300 MG capsule Take by mouth.   08/27/2022 at 0730   hydrochlorothiazide (HYDRODIURIL) 12.5 MG tablet    08/26/2022   HYDROcodone-acetaminophen (NORCO) 5-325 MG tablet Take 1 tablet every 3-4 hours by oral route as needed.   08/26/2022   methimazole (TAPAZOLE) 5 MG tablet Take by mouth.   08/26/2022   predniSONE (DELTASONE) 5 MG tablet Take 5 mg by mouth daily.   08/26/2022   rosuvastatin (CRESTOR) 5 MG tablet Take 1 tablet by mouth at bedtime.   08/26/2022   sulfamethoxazole-trimethoprim (BACTRIM) 400-80 MG tablet Take 1 tablet by mouth 3 (three) times a week.   08/26/2022   DULoxetine (CYMBALTA) 30 MG capsule Take by mouth.        Allergies  Allergen Reactions   Bismuth Subsalicylate Nausea And Vomiting      Past Medical History:  Diagnosis Date   A-fib ([place])    Anemia    Asthma    Cancer ([place])    uterine ca   Chronic cough    COPD (chronic obstructive pulmonary disease) (HCC)    Dysrhythmia    ATRIAL FIB.   Endometrial cancer ([place])    Gallstones 10/16/2016   Hypertension    Hypoxemia    Obesity    Oxygen dependent    Pneumonia    Sleep apnea    Tachycardia    Tobacco use     Review of systems:  Otherwise negative.    Physical Exam  Gen: Alert, oriented. Appears stated age.  HEENT: PERRLA. Lungs: No respiratory distress CV: RRR Abd: soft, benign, no masses Ext: No edema    Planned procedures: Proceed with colonoscopy. The patient understands the nature of the planned procedure, indications, risks, alternatives and potential complications including but not limited to bleeding, infection, perforation, damage to internal organs and possible oversedation/side effects from anesthesia. The patient agrees and gives consent to proceed.  Please refer to procedure notes for findings, recommendations and patient disposition/instructions.     Lesly Rubenstein, MD [facility] Gastroenterology

## 2022-08-27 NOTE — Anesthesia Preprocedure Evaluation (Addendum)
Anesthesia Evaluation  Patient identified by MRN, date of birth, ID band Patient awake    Reviewed: Allergy & Precautions, NPO status , Patient's Chart, lab work & pertinent test results  History of Anesthesia Complications Negative for: history of anesthetic complications  Airway Mallampati: III  TM Distance: >3 FB Neck ROM: Full    Dental  (+) Poor Dentition, Partial Lower, Partial Upper   Pulmonary asthma , sleep apnea and Continuous Positive Airway Pressure Ventilation , pneumonia, resolved, COPD,  COPD inhaler and oxygen dependent, Current Smoker and Patient abstained from smoking.,    Pulmonary exam normal breath sounds clear to auscultation       Cardiovascular Exercise Tolerance: Poor hypertension, Pt. on medications + CAD and + Peripheral Vascular Disease  Normal cardiovascular exam+ dysrhythmias Atrial Fibrillation  Rhythm:Regular Rate:Normal  Last Eloquis use 9/15   Neuro/Psych Neuropathy bilateral legs    Neuromuscular disease negative psych ROS   GI/Hepatic negative GI ROS, Neg liver ROS,   Endo/Other  Hyperthyroidism Morbid obesity  Renal/GU negative Renal ROS  negative genitourinary   Musculoskeletal negative musculoskeletal ROS (+)   Abdominal   Peds negative pediatric ROS (+)  Hematology negative hematology ROS (+)   Anesthesia Other Findings   Reproductive/Obstetrics negative OB ROS                            Anesthesia Physical Anesthesia Plan  ASA: 3  Anesthesia Plan: General   Post-op Pain Management: Minimal or no pain anticipated   Induction: Intravenous  PONV Risk Score and Plan: 2 and Propofol infusion and TIVA  Airway Management Planned: Natural Airway and Nasal Cannula  Additional Equipment:   Intra-op Plan:   Post-operative Plan:   Informed Consent: I have reviewed the patients History and Physical, chart, labs and discussed the procedure  including the risks, benefits and alternatives for the proposed anesthesia with the patient or authorized representative who has indicated his/her understanding and acceptance.     Dental Advisory Given  Plan Discussed with: Anesthesiologist, CRNA and Surgeon  Anesthesia Plan Comments: (Patient consented for risks of anesthesia including but not limited to:  - adverse reactions to medications - risk of airway placement if required - damage to eyes, teeth, lips or other oral mucosa - nerve damage due to positioning  - sore throat or hoarseness - Damage to heart, brain, nerves, lungs, other parts of body or loss of life  Patient voiced understanding.)        Anesthesia Quick Evaluation

## 2022-08-27 NOTE — Interval H&P Note (Signed)
History and Physical Interval Note:  08/27/2022 10:49 AM  Jessica Dalton  has presented today for surgery, with the diagnosis of ABDOMINAL PAIN,ABNORMAL CT SCAN.  The various methods of treatment have been discussed with the patient and family. After consideration of risks, benefits and other options for treatment, the patient has consented to  Procedure(s): COLONOSCOPY WITH PROPOFOL (N/A) as a surgical intervention.  The patient's history has been reviewed, patient examined, no change in status, stable for surgery.  I have reviewed the patient's chart and labs.  Questions were answered to the patient's satisfaction.     Lesly Rubenstein  Ok to proceed with colonoscopy

## 2022-08-28 ENCOUNTER — Encounter: Payer: Self-pay | Admitting: Gastroenterology

## 2022-08-28 LAB — SURGICAL PATHOLOGY

## 2022-10-07 ENCOUNTER — Encounter (INDEPENDENT_AMBULATORY_CARE_PROVIDER_SITE_OTHER): Payer: Self-pay

## 2022-11-20 ENCOUNTER — Ambulatory Visit: Payer: Medicare HMO | Attending: Family Medicine

## 2023-01-07 ENCOUNTER — Other Ambulatory Visit: Payer: Self-pay | Admitting: Gastroenterology

## 2023-01-07 DIAGNOSIS — K760 Fatty (change of) liver, not elsewhere classified: Secondary | ICD-10-CM

## 2023-01-10 ENCOUNTER — Other Ambulatory Visit (INDEPENDENT_AMBULATORY_CARE_PROVIDER_SITE_OTHER): Payer: Self-pay | Admitting: Vascular Surgery

## 2023-01-10 ENCOUNTER — Ambulatory Visit
Admission: RE | Admit: 2023-01-10 | Discharge: 2023-01-10 | Disposition: A | Discharge status: Home / Self Care | Payer: Medicare HMO | Source: Ambulatory Visit | Attending: Gastroenterology | Admitting: Gastroenterology

## 2023-01-10 DIAGNOSIS — K760 Fatty (change of) liver, not elsewhere classified: Secondary | ICD-10-CM | POA: Diagnosis present

## 2023-01-10 DIAGNOSIS — I739 Peripheral vascular disease, unspecified: Secondary | ICD-10-CM

## 2023-01-11 DIAGNOSIS — I70219 Atherosclerosis of native arteries of extremities with intermittent claudication, unspecified extremity: Secondary | ICD-10-CM | POA: Insufficient documentation

## 2023-01-11 DIAGNOSIS — I2510 Atherosclerotic heart disease of native coronary artery without angina pectoris: Secondary | ICD-10-CM | POA: Insufficient documentation

## 2023-01-11 NOTE — Progress Notes (Deleted)
MRN : [id number]  Jessica Dalton is a 72 y.o. ([birth year]) female who presents with chief complaint of check circulation.  History of Present Illness:   The patient returns to the office for followup and review of the noninvasive studies.   There have been no interval changes in lower extremity symptoms. No interval shortening of the patient's claudication distance or development of rest pain symptoms. No new ulcers or wounds have occurred since the last visit.  There have been no significant changes to the patient's overall health care.  The patient denies amaurosis fugax or recent TIA symptoms. There are no documented recent neurological changes noted. There is no history of DVT, PE or superficial thrombophlebitis. The patient denies recent episodes of angina or shortness of breath.   CT angio of the abdomen and pelvis dated 06/21/2022 is reviewed by me personally. Significant findings include no evidence for mesenteric ischemia the SMA, celiac artery and IMA are all widely patent. 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 Incidental finding of an area of thickening of the sigmoid colon.   ABI Rt=*** and Lt=***  (previous ABI's Rt=*** and Lt=***) Duplex ultrasound of the ***  No outpatient medications have been marked as taking for the 01/13/23 encounter (Appointment) with Delana Meyer, Dolores Lory, MD.    Past Medical History:  Diagnosis Date   A-fib ([place])    Anemia    Asthma    Cancer ([place])    uterine ca   Chronic cough    COPD (chronic obstructive pulmonary disease) ([place])    Dysrhythmia    ATRIAL FIB.   Endometrial cancer ([place])    Gallstones 10/16/2016   Hypertension    Hypoxemia    Obesity    Oxygen dependent    Pneumonia    Sleep apnea    Tachycardia    Tobacco use     Past Surgical History:  Procedure Laterality Date   ABDOMINAL HYSTERECTOMY     CATARACT EXTRACTION, BILATERAL   2015   CHOLECYSTECTOMY N/A 01/23/2017   Procedure: LAPAROSCOPIC CHOLECYSTECTOMY WITH INTRAOPERATIVE CHOLANGIOGRAM;  Surgeon: Robert Bellow, MD;  Location: [facility] ORS;  Service: General;  Laterality: N/A;   COLONOSCOPY WITH PROPOFOL N/A 09/08/2017   Procedure: COLONOSCOPY WITH PROPOFOL;  Surgeon: Manya Silvas, MD;  Location: [facility] ENDOSCOPY;  Service: Endoscopy;  Laterality: N/A;   COLONOSCOPY WITH PROPOFOL N/A 12/22/2019   Procedure: COLONOSCOPY WITH PROPOFOL;  Surgeon: Toledo, Benay Pike, MD;  Location: [facility] ENDOSCOPY;  Service: Gastroenterology;  Laterality: N/A;   COLONOSCOPY WITH PROPOFOL N/A 08/27/2022   Procedure: COLONOSCOPY WITH PROPOFOL;  Surgeon: Lesly Rubenstein, MD;  Location: [facility] ENDOSCOPY;  Service: Endoscopy;  Laterality: N/A;   ESOPHAGOGASTRODUODENOSCOPY (EGD) WITH PROPOFOL N/A 09/08/2017   Procedure: ESOPHAGOGASTRODUODENOSCOPY (EGD) WITH PROPOFOL;  Surgeon: Manya Silvas, MD;  Location: [facility] ENDOSCOPY;  Service: Endoscopy;  Laterality: N/A;   EYE SURGERY     PELVIC LYMPH NODE DISSECTION N/A 10/16/2016   Procedure: PELVIC LYMPH NODE DISSECTION;  Surgeon: Gillis Ends, MD;  Location: [facility] ORS;  Service: Gynecology;  Laterality: N/A;   ROBOTIC ASSISTED TOTAL HYSTERECTOMY WITH BILATERAL SALPINGO OOPHERECTOMY Bilateral 10/16/2016   Procedure: ROBOTIC ASSISTED TOTAL HYSTERECTOMY WITH BILATERAL SALPINGO OOPHORECTOMY;  Surgeon: Gillis Ends, MD;  Location: [facility] ORS;  Service: Gynecology;  Laterality: Bilateral;   SENTINEL NODE BIOPSY N/A 10/16/2016   Procedure: [employer]  NODE INJECTION;  Surgeon: Gillis Ends, MD;  Location: [facility] ORS;  Service: Gynecology;  Laterality: N/A;   TUBAL LIGATION      Social History Social History   Tobacco Use   Smoking status: Every Day    Packs/day: 0.50    Years: 54.00    Total pack years: 27.00    Types: Cigarettes   Smokeless tobacco: Never   Tobacco comments:    50 pk yr - 1.5 ppd; 53 years; started  age 49  Vaping Use   Vaping Use: Former  Substance Use Topics   Alcohol use: No   Drug use: No    Family History Family History  Problem Relation Age of Onset   Kidney cancer Mother 44       deceased 80   Colon cancer Father 69       deceased 69   Melanoma Sister 64       deceased 64   Uterine cancer Sister 37   Cancer Paternal Grandmother        unk. age or type   Cancer Other        maternal and paternal aunts/uncles with cancer; no specifics available   Breast cancer Paternal Aunt     Allergies  Allergen Reactions   Bismuth Subsalicylate Nausea And Vomiting     REVIEW OF SYSTEMS (Negative unless checked)  Constitutional: '[]'$ Weight loss  '[]'$ Fever  '[]'$ Chills Cardiac: '[]'$ Chest pain   '[]'$ Chest pressure   '[]'$ Palpitations   '[]'$ Shortness of breath when laying flat   '[]'$ Shortness of breath with exertion. Vascular:  '[x]'$ Pain in legs with walking   '[]'$ Pain in legs at rest  '[]'$ History of DVT   '[]'$ Phlebitis   '[]'$ Swelling in legs   '[]'$ Varicose veins   '[]'$ Non-healing ulcers Pulmonary:   '[]'$ Uses home oxygen   '[]'$ Productive cough   '[]'$ Hemoptysis   '[]'$ Wheeze  '[]'$ COPD   '[]'$ Asthma Neurologic:  '[]'$ Dizziness   '[]'$ Seizures   '[]'$ History of stroke   '[]'$ History of TIA  '[]'$ Aphasia   '[]'$ Vissual changes   '[]'$ Weakness or numbness in arm   '[]'$ Weakness or numbness in leg Musculoskeletal:   '[]'$ Joint swelling   '[]'$ Joint pain   '[]'$ Low back pain Hematologic:  '[]'$ Easy bruising  '[]'$ Easy bleeding   '[]'$ Hypercoagulable state   '[]'$ Anemic Gastrointestinal:  '[]'$ Diarrhea   '[]'$ Vomiting  '[]'$ Gastroesophageal reflux/heartburn   '[]'$ Difficulty swallowing. Genitourinary:  '[]'$ Chronic kidney disease   '[]'$ Difficult urination  '[]'$ Frequent urination   '[]'$ Blood in urine Skin:  '[]'$ Rashes   '[]'$ Ulcers  Psychological:  '[]'$ History of anxiety   '[]'$  History of major depression.  Physical Examination  There were no vitals filed for this visit. There is no height or weight on file to calculate BMI. Gen: WD/WN, NAD Head: [place]/AT, No temporalis wasting.  Ear/Nose/Throat: Hearing  grossly intact, nares w/o erythema or drainage Eyes: PER, EOMI, sclera nonicteric.  Neck: Supple, no masses.  No bruit or JVD.  Pulmonary:  Good air movement, no audible wheezing, no use of accessory muscles.  Cardiac: RRR, normal S1, S2, no Murmurs. Vascular:  mild trophic changes, no open wounds Vessel Right Left  Radial Palpable Palpable  PT Not Palpable Not Palpable  DP Not Palpable Not Palpable  Gastrointestinal: soft, non-distended. No guarding/no peritoneal signs.  Musculoskeletal: M/S 5/5 throughout.  No visible deformity.  Neurologic: CN 2-12 intact. Pain and light touch intact in extremities.  Symmetrical.  Speech is fluent. Motor exam as listed above. Psychiatric: Judgment intact, Mood & affect appropriate for pt's clinical situation. Dermatologic: No rashes or ulcers noted.  No changes consistent with cellulitis.   CBC Lab Results  Component Value Date   WBC 10.1 01/15/2017   HGB 14.0 01/15/2017   HCT 40.9 01/15/2017   MCV 87.8 01/15/2017   PLT 337 01/15/2017    BMET    Component Value Date/Time   NA 141 01/15/2017 0938   NA 138 07/04/2012 1319   K 3.5 01/15/2017 0938   K 3.8 07/04/2012 1319   CL 101 01/15/2017 0938   CL 104 07/04/2012 1319   CO2 30 01/15/2017 0938   CO2 27 07/04/2012 1319   GLUCOSE 82 01/15/2017 0938   GLUCOSE 98 07/04/2012 1319   BUN 9 01/15/2017 0938   BUN 9 07/04/2012 1319   CREATININE 0.60 06/21/2022 0928   CREATININE 0.58 (L) 07/04/2012 1319   CALCIUM 9.3 01/15/2017 0938   CALCIUM 8.9 07/04/2012 1319   GFRNONAA >60 09/03/2017 1203   GFRNONAA >60 07/04/2012 1319   GFRAA >60 09/03/2017 1203   GFRAA >60 07/04/2012 1319   CrCl cannot be calculated (Patient's most recent lab result is older than the maximum 21 days allowed.).  COAG No results found for: "INR", "PROTIME"  Radiology US Abdomen Complete  Result Date: 01/10/2023 CLINICAL DATA:  Fatty liver. EXAM: ABDOMEN ULTRASOUND COMPLETE COMPARISON:  November 12, 2016 FINDINGS:  Gallbladder: Prior cholecystectomy. Common bile duct: Diameter: 3.5 mm Liver: No focal lesion identified. Mild nodular contour with diffuse increased echotexture of the liver. Vein is patent on color Doppler imaging with normal direction of blood flow towards the liver. IVC: No abnormality visualized. Pancreas: Ultrasound technologist reports not well visualized. Spleen: Size and appearance within normal limits. Right Kidney: Length: 11.4 cm. Echogenicity within normal limits. No mass or hydronephrosis visualized. Left Kidney: Length: 12.6 cm. Echogenicity within normal limits. No mass or hydronephrosis visualized. Abdominal aorta: No aneurysm visualized. Other findings: None. IMPRESSION: 1. Fatty infiltration of liver. Nodular contour of the liver suggests cirrhosis of liver. No focal liver lesion identified. 2. Prior cholecystectomy. Electronically Signed   By: Abelardo Diesel M.D.   On: 01/10/2023 09:36     Assessment/Plan There are no diagnoses linked to this encounter.   Hortencia Pilar, MD  01/11/2023 1:09 PM

## 2023-01-13 ENCOUNTER — Ambulatory Visit (INDEPENDENT_AMBULATORY_CARE_PROVIDER_SITE_OTHER): Payer: Medicare HMO | Admitting: Vascular Surgery

## 2023-01-13 ENCOUNTER — Encounter (INDEPENDENT_AMBULATORY_CARE_PROVIDER_SITE_OTHER): Payer: Medicare HMO

## 2023-01-13 DIAGNOSIS — I10 Essential (primary) hypertension: Secondary | ICD-10-CM

## 2023-01-13 DIAGNOSIS — I70213 Atherosclerosis of native arteries of extremities with intermittent claudication, bilateral legs: Secondary | ICD-10-CM

## 2023-01-13 DIAGNOSIS — I25119 Atherosclerotic heart disease of native coronary artery with unspecified angina pectoris: Secondary | ICD-10-CM

## 2023-01-13 DIAGNOSIS — I890 Lymphedema, not elsewhere classified: Secondary | ICD-10-CM

## 2023-01-13 DIAGNOSIS — I480 Paroxysmal atrial fibrillation: Secondary | ICD-10-CM

## 2023-02-11 NOTE — Progress Notes (Unsigned)
MRN : [id number]  Jessica Dalton is a 72 y.o. (1950/12/15) female who presents with chief complaint of check circulation.  History of Present Illness:   The patient returns for follow-up regarding painful lower extremities.  In the past she described the pain as beginning in her lower back and then radiating through the buttock and down the back of both legs.  Patient notes the pain is variable and not always associated with activity.  The patient notes the pain also occurs with standing as well as with laying flat on her back and routinely seems worse as the day wears on.    The patient denies a significant history of back problems or DJD of the lumbar and sacral spine.  But review of the chart shows that she has seen Dr. Cari Caraway in the past and has had cervical thoracic and lumbar sacral MRIs   The patient denies rest pain or dangling of an extremity off the side of the bed during the night for relief. No open wounds or sores at this time. No history of DVT or phlebitis. No prior vascular interventions or surgeries.   Previous review of a CT angio of the abdomen and pelvis dated 06/21/2022 showed no evidence for mesenteric ischemia the SMA, celiac artery and IMA are all widely patent.  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  Incidental finding of an area of thickening of the sigmoid colon.  No outpatient medications have been marked as taking for the 02/13/23 encounter (Appointment) with Delana Meyer, Dolores Lory, MD.    Past Medical History:  Diagnosis Date   A-fib ([place])    Anemia    Asthma    Cancer ([place])    uterine ca   Chronic cough    COPD (chronic obstructive pulmonary disease) ([place])    Dysrhythmia    ATRIAL FIB.   Endometrial cancer ([place])    Gallstones 10/16/2016   Hypertension    Hypoxemia    Obesity    Oxygen dependent    Pneumonia    Sleep apnea    Tachycardia    Tobacco use      Past Surgical History:  Procedure Laterality Date   ABDOMINAL HYSTERECTOMY     CATARACT EXTRACTION, BILATERAL  2015   CHOLECYSTECTOMY N/A 01/23/2017   Procedure: LAPAROSCOPIC CHOLECYSTECTOMY WITH INTRAOPERATIVE CHOLANGIOGRAM;  Surgeon: Robert Bellow, MD;  Location: [facility] ORS;  Service: General;  Laterality: N/A;   COLONOSCOPY WITH PROPOFOL N/A 09/08/2017   Procedure: COLONOSCOPY WITH PROPOFOL;  Surgeon: Manya Silvas, MD;  Location: [facility] ENDOSCOPY;  Service: Endoscopy;  Laterality: N/A;   COLONOSCOPY WITH PROPOFOL N/A 12/22/2019   Procedure: COLONOSCOPY WITH PROPOFOL;  Surgeon: Toledo, Benay Pike, MD;  Location: [facility] ENDOSCOPY;  Service: Gastroenterology;  Laterality: N/A;   COLONOSCOPY WITH PROPOFOL N/A 08/27/2022   Procedure: COLONOSCOPY WITH PROPOFOL;  Surgeon: Lesly Rubenstein, MD;  Location: [facility] ENDOSCOPY;  Service: Endoscopy;  Laterality: N/A;   ESOPHAGOGASTRODUODENOSCOPY (EGD) WITH PROPOFOL N/A 09/08/2017   Procedure: ESOPHAGOGASTRODUODENOSCOPY (EGD) WITH PROPOFOL;  Surgeon: Manya Silvas, MD;  Location: [facility] ENDOSCOPY;  Service: Endoscopy;  Laterality: N/A;   EYE SURGERY     PELVIC LYMPH NODE DISSECTION N/A 10/16/2016   Procedure: PELVIC LYMPH NODE DISSECTION;  Surgeon: Gillis Ends, MD;  Location: [facility] ORS;  Service: Gynecology;  Laterality: N/A;   ROBOTIC ASSISTED TOTAL HYSTERECTOMY  WITH BILATERAL SALPINGO OOPHERECTOMY Bilateral 10/16/2016   Procedure: ROBOTIC ASSISTED TOTAL HYSTERECTOMY WITH BILATERAL SALPINGO OOPHORECTOMY;  Surgeon: Gillis Ends, MD;  Location: [facility] ORS;  Service: Gynecology;  Laterality: Bilateral;   SENTINEL NODE BIOPSY N/A 10/16/2016   Procedure: SENTINEL NODE INJECTION;  Surgeon: Gillis Ends, MD;  Location: [facility] ORS;  Service: Gynecology;  Laterality: N/A;   TUBAL LIGATION      Social History Social History   Tobacco Use   Smoking status: Every Day    Packs/day: 0.50    Years: 54.00    Total pack years:  27.00    Types: Cigarettes   Smokeless tobacco: Never   Tobacco comments:    23 pk yr - 1.5 ppd; 42 years; started age 33  Vaping Use   Vaping Use: Former  Substance Use Topics   Alcohol use: No   Drug use: No    Family History Family History  Problem Relation Age of Onset   Kidney cancer Mother 25       deceased 80   Colon cancer Father 24       deceased 63   Melanoma Sister 39       deceased 57   Uterine cancer Sister 53   Cancer Paternal Grandmother        unk. age or type   Cancer Other        maternal and paternal aunts/uncles with cancer; no specifics available   Breast cancer Paternal Aunt     Allergies  Allergen Reactions   Bismuth Subsalicylate Nausea And Vomiting     REVIEW OF SYSTEMS (Negative unless checked)  Constitutional: '[]'$ Weight loss  '[]'$ Fever  '[]'$ Chills Cardiac: '[]'$ Chest pain   '[]'$ Chest pressure   '[]'$ Palpitations   '[]'$ Shortness of breath when laying flat   '[]'$ Shortness of breath with exertion. Vascular:  '[x]'$ Pain in legs with walking   '[]'$ Pain in legs at rest  '[]'$ History of DVT   '[]'$ Phlebitis   '[]'$ Swelling in legs   '[]'$ Varicose veins   '[]'$ Non-healing ulcers Pulmonary:   '[]'$ Uses home oxygen   '[]'$ Productive cough   '[]'$ Hemoptysis   '[]'$ Wheeze  '[]'$ COPD   '[]'$ Asthma Neurologic:  '[]'$ Dizziness   '[]'$ Seizures   '[]'$ History of stroke   '[]'$ History of TIA  '[]'$ Aphasia   '[]'$ Vissual changes   '[]'$ Weakness or numbness in arm   '[]'$ Weakness or numbness in leg Musculoskeletal:   '[]'$ Joint swelling   '[]'$ Joint pain   '[]'$ Low back pain Hematologic:  '[]'$ Easy bruising  '[]'$ Easy bleeding   '[]'$ Hypercoagulable state   '[]'$ Anemic Gastrointestinal:  '[]'$ Diarrhea   '[]'$ Vomiting  '[]'$ Gastroesophageal reflux/heartburn   '[]'$ Difficulty swallowing. Genitourinary:  '[]'$ Chronic kidney disease   '[]'$ Difficult urination  '[]'$ Frequent urination   '[]'$ Blood in urine Skin:  '[]'$ Rashes   '[]'$ Ulcers  Psychological:  '[]'$ History of anxiety   '[]'$  History of major depression.  Physical Examination  There were no vitals filed for this visit. There is no  height or weight on file to calculate BMI. Gen: WD/WN, NAD Head: [place]/AT, No temporalis wasting.  Ear/Nose/Throat: Hearing grossly intact, nares w/o erythema or drainage Eyes: PER, EOMI, sclera nonicteric.  Neck: Supple, no masses.  No bruit or JVD.  Pulmonary:  Good air movement, no audible wheezing, no use of accessory muscles.  Cardiac: RRR, normal S1, S2, no Murmurs. Vascular:  mild trophic changes, no open wounds Vessel Right Left  Radial Palpable Palpable  PT Not Palpable Not Palpable  DP Not Palpable Not Palpable  Gastrointestinal: soft, non-distended. No guarding/no peritoneal signs.  Musculoskeletal: M/S 5/5 throughout.  No visible deformity.  Neurologic: CN 2-12 intact. Pain and light touch intact in extremities.  Symmetrical.  Speech is fluent. Motor exam as listed above. Psychiatric: Judgment intact, Mood & affect appropriate for pt's clinical situation. Dermatologic: No rashes or ulcers noted.  No changes consistent with cellulitis.   CBC Lab Results  Component Value Date   WBC 10.1 01/15/2017   HGB 14.0 01/15/2017   HCT 40.9 01/15/2017   MCV 87.8 01/15/2017   PLT 337 01/15/2017    BMET    Component Value Date/Time   NA 141 01/15/2017 0938   NA 138 07/04/2012 1319   K 3.5 01/15/2017 0938   K 3.8 07/04/2012 1319   CL 101 01/15/2017 0938   CL 104 07/04/2012 1319   CO2 30 01/15/2017 0938   CO2 27 07/04/2012 1319   GLUCOSE 82 01/15/2017 0938   GLUCOSE 98 07/04/2012 1319   BUN 9 01/15/2017 0938   BUN 9 07/04/2012 1319   CREATININE 0.60 06/21/2022 0928   CREATININE 0.58 (L) 07/04/2012 1319   CALCIUM 9.3 01/15/2017 0938   CALCIUM 8.9 07/04/2012 1319   GFRNONAA >60 09/03/2017 1203   GFRNONAA >60 07/04/2012 1319   GFRAA >60 09/03/2017 1203   GFRAA >60 07/04/2012 1319   CrCl cannot be calculated (Patient's most recent lab result is older than the maximum 21 days allowed.).  COAG No results found for: "INR", "PROTIME"  Radiology No results  found.   Assessment/Plan There are no diagnoses linked to this encounter.   Hortencia Pilar, MD  02/11/2023 1:17 PM

## 2023-02-13 ENCOUNTER — Ambulatory Visit (INDEPENDENT_AMBULATORY_CARE_PROVIDER_SITE_OTHER): Payer: Medicare HMO

## 2023-02-13 ENCOUNTER — Ambulatory Visit (INDEPENDENT_AMBULATORY_CARE_PROVIDER_SITE_OTHER): Payer: Medicare HMO | Admitting: Vascular Surgery

## 2023-02-13 ENCOUNTER — Encounter (INDEPENDENT_AMBULATORY_CARE_PROVIDER_SITE_OTHER): Payer: Self-pay | Admitting: Vascular Surgery

## 2023-02-13 VITALS — BP 118/76 | HR 84 | Resp 18 | Ht 61.0 in | Wt 235.0 lb

## 2023-02-13 DIAGNOSIS — I70213 Atherosclerosis of native arteries of extremities with intermittent claudication, bilateral legs: Secondary | ICD-10-CM

## 2023-02-13 DIAGNOSIS — I739 Peripheral vascular disease, unspecified: Secondary | ICD-10-CM | POA: Diagnosis not present

## 2023-02-13 DIAGNOSIS — I10 Essential (primary) hypertension: Secondary | ICD-10-CM | POA: Diagnosis not present

## 2023-02-13 DIAGNOSIS — I890 Lymphedema, not elsewhere classified: Secondary | ICD-10-CM | POA: Diagnosis not present

## 2023-02-13 DIAGNOSIS — M79605 Pain in left leg: Secondary | ICD-10-CM

## 2023-02-13 DIAGNOSIS — I480 Paroxysmal atrial fibrillation: Secondary | ICD-10-CM

## 2023-02-13 DIAGNOSIS — M79604 Pain in right leg: Secondary | ICD-10-CM

## 2023-04-21 ENCOUNTER — Other Ambulatory Visit: Payer: Self-pay | Admitting: Neurology

## 2023-04-21 DIAGNOSIS — M79604 Pain in right leg: Secondary | ICD-10-CM

## 2023-04-22 ENCOUNTER — Encounter: Payer: Self-pay | Admitting: Neurology

## 2023-05-12 ENCOUNTER — Other Ambulatory Visit: Payer: Medicare HMO

## 2023-05-15 ENCOUNTER — Ambulatory Visit
Admission: RE | Admit: 2023-05-15 | Discharge: 2023-05-15 | Disposition: A | Discharge status: Home / Self Care | Payer: Medicare HMO | Source: Ambulatory Visit | Attending: Neurology | Admitting: Neurology

## 2023-05-15 DIAGNOSIS — M79604 Pain in right leg: Secondary | ICD-10-CM

## 2023-08-18 ENCOUNTER — Encounter (INDEPENDENT_AMBULATORY_CARE_PROVIDER_SITE_OTHER): Payer: Medicare HMO

## 2023-08-18 ENCOUNTER — Ambulatory Visit (INDEPENDENT_AMBULATORY_CARE_PROVIDER_SITE_OTHER): Payer: Medicare HMO | Admitting: Vascular Surgery

## 2023-08-21 ENCOUNTER — Other Ambulatory Visit: Payer: Self-pay | Admitting: Family Medicine

## 2023-08-21 DIAGNOSIS — Z1231 Encounter for screening mammogram for malignant neoplasm of breast: Secondary | ICD-10-CM

## 2023-08-27 ENCOUNTER — Ambulatory Visit
Admission: RE | Admit: 2023-08-27 | Discharge: 2023-08-27 | Disposition: A | Discharge status: Home / Self Care | Payer: Medicare HMO | Source: Ambulatory Visit | Attending: Family Medicine | Admitting: Family Medicine

## 2023-08-27 DIAGNOSIS — Z1231 Encounter for screening mammogram for malignant neoplasm of breast: Secondary | ICD-10-CM | POA: Diagnosis present

## 2023-10-08 ENCOUNTER — Other Ambulatory Visit (INDEPENDENT_AMBULATORY_CARE_PROVIDER_SITE_OTHER): Payer: Self-pay | Admitting: Vascular Surgery

## 2023-10-08 DIAGNOSIS — M7989 Other specified soft tissue disorders: Secondary | ICD-10-CM

## 2023-10-09 ENCOUNTER — Ambulatory Visit (INDEPENDENT_AMBULATORY_CARE_PROVIDER_SITE_OTHER): Payer: Medicare HMO | Admitting: Vascular Surgery

## 2023-10-09 ENCOUNTER — Ambulatory Visit (INDEPENDENT_AMBULATORY_CARE_PROVIDER_SITE_OTHER): Payer: Medicare HMO

## 2023-10-09 DIAGNOSIS — M7989 Other specified soft tissue disorders: Secondary | ICD-10-CM

## 2023-10-20 ENCOUNTER — Ambulatory Visit (INDEPENDENT_AMBULATORY_CARE_PROVIDER_SITE_OTHER): Payer: Medicare HMO | Admitting: Vascular Surgery

## 2023-11-03 ENCOUNTER — Encounter (INDEPENDENT_AMBULATORY_CARE_PROVIDER_SITE_OTHER): Payer: Self-pay | Admitting: Vascular Surgery

## 2023-11-03 ENCOUNTER — Ambulatory Visit (INDEPENDENT_AMBULATORY_CARE_PROVIDER_SITE_OTHER): Payer: Medicare HMO | Admitting: Vascular Surgery

## 2023-11-03 VITALS — BP 145/64 | HR 84 | Resp 16 | Wt 230.0 lb

## 2023-11-03 DIAGNOSIS — I70213 Atherosclerosis of native arteries of extremities with intermittent claudication, bilateral legs: Secondary | ICD-10-CM | POA: Diagnosis not present

## 2023-11-03 DIAGNOSIS — I10 Essential (primary) hypertension: Secondary | ICD-10-CM

## 2023-11-03 DIAGNOSIS — M47817 Spondylosis without myelopathy or radiculopathy, lumbosacral region: Secondary | ICD-10-CM

## 2023-11-03 DIAGNOSIS — I25119 Atherosclerotic heart disease of native coronary artery with unspecified angina pectoris: Secondary | ICD-10-CM

## 2023-11-03 DIAGNOSIS — I480 Paroxysmal atrial fibrillation: Secondary | ICD-10-CM | POA: Diagnosis not present

## 2023-11-03 DIAGNOSIS — I701 Atherosclerosis of renal artery: Secondary | ICD-10-CM

## 2023-11-03 NOTE — Progress Notes (Unsigned)
MRN : [id number]  Jessica Dalton is a 72 y.o. (1951/05/06) female who presents with chief complaint of legs hurt and swell.  History of Present Illness: ***  No outpatient medications have been marked as taking for the 11/03/23 encounter (Appointment) with Gilda Crease, Latina Craver, MD.    Past Medical History:  Diagnosis Date   A-fib ([facility])    Anemia    Asthma    Cancer ([facility])    uterine ca   Chronic cough    COPD (chronic obstructive pulmonary disease) ([facility])    Dysrhythmia    ATRIAL FIB.   Endometrial cancer ([facility])    Gallstones 10/16/2016   Hypertension    Hypoxemia    Obesity    Oxygen dependent    Pneumonia    Sleep apnea    Tachycardia    Tobacco use     Past Surgical History:  Procedure Laterality Date   ABDOMINAL HYSTERECTOMY     CATARACT EXTRACTION, BILATERAL  2015   CHOLECYSTECTOMY N/A 01/23/2017   Procedure: LAPAROSCOPIC CHOLECYSTECTOMY WITH INTRAOPERATIVE CHOLANGIOGRAM;  Surgeon: Earline Mayotte, MD;  Location: [facility] ORS;  Service: General;  Laterality: N/A;   COLONOSCOPY WITH PROPOFOL N/A 09/08/2017   Procedure: COLONOSCOPY WITH PROPOFOL;  Surgeon: Scot Jun, MD;  Location: [facility] ENDOSCOPY;  Service: Endoscopy;  Laterality: N/A;   COLONOSCOPY WITH PROPOFOL N/A 12/22/2019   Procedure: COLONOSCOPY WITH PROPOFOL;  Surgeon: Toledo, Boykin Nearing, MD;  Location: [facility] ENDOSCOPY;  Service: Gastroenterology;  Laterality: N/A;   COLONOSCOPY WITH PROPOFOL N/A 08/27/2022   Procedure: COLONOSCOPY WITH PROPOFOL;  Surgeon: Regis Bill, MD;  Location: [facility] ENDOSCOPY;  Service: Endoscopy;  Laterality: N/A;   ESOPHAGOGASTRODUODENOSCOPY (EGD) WITH PROPOFOL N/A 09/08/2017   Procedure: ESOPHAGOGASTRODUODENOSCOPY (EGD) WITH PROPOFOL;  Surgeon: Scot Jun, MD;  Location: [facility] ENDOSCOPY;  Service: Endoscopy;  Laterality: N/A;   EYE SURGERY     PELVIC LYMPH NODE DISSECTION N/A 10/16/2016   Procedure: PELVIC LYMPH NODE DISSECTION;  Surgeon: Artelia Laroche, MD;  Location: [facility] ORS;  Service: Gynecology;  Laterality: N/A;   ROBOTIC ASSISTED TOTAL HYSTERECTOMY WITH BILATERAL SALPINGO OOPHERECTOMY Bilateral 10/16/2016   Procedure: ROBOTIC ASSISTED TOTAL HYSTERECTOMY WITH BILATERAL SALPINGO OOPHORECTOMY;  Surgeon: Artelia Laroche, MD;  Location: [facility] ORS;  Service: Gynecology;  Laterality: Bilateral;   SENTINEL NODE BIOPSY N/A 10/16/2016   Procedure: SENTINEL NODE INJECTION;  Surgeon: Artelia Laroche, MD;  Location: [facility] ORS;  Service: Gynecology;  Laterality: N/A;   TUBAL LIGATION      Social History Social History   Tobacco Use   Smoking status: Every Day    Current packs/day: 0.50    Average packs/day: 0.5 packs/day for 54.0 years (27.0 ttl pk-yrs)    Types: Cigarettes   Smokeless tobacco: Never   Tobacco comments:    75 pk yr - 1.5 ppd; 49 years; started age 65  Vaping Use   Vaping status: Former  Substance Use Topics   Alcohol use: No   Drug use: No    Family History Family History  Problem Relation Age of Onset   Kidney cancer Mother 49       deceased 42   Colon cancer Father 36       deceased 24   Melanoma Sister 44       deceased 52   Uterine cancer Sister 71   Cancer Paternal Grandmother        unk. age or type   Cancer Other  maternal and paternal aunts/uncles with cancer; no specifics available   Breast cancer Paternal Aunt     Allergies  Allergen Reactions   Bismuth Subsalicylate Nausea And Vomiting     REVIEW OF SYSTEMS (Negative unless checked)  Constitutional: [] Weight loss  [] Fever  [] Chills Cardiac: [] Chest pain   [] Chest pressure   [] Palpitations   [] Shortness of breath when laying flat   [] Shortness of breath with exertion. Vascular:  [] Pain in legs with walking   [x] Pain in legs at rest  [] History of DVT   [] Phlebitis   [x] Swelling in legs   [] Varicose veins   [] Non-healing ulcers Pulmonary:   [] Uses home oxygen   [] Productive cough   [] Hemoptysis   [] Wheeze  [] COPD    [] Asthma Neurologic:  [] Dizziness   [] Seizures   [] History of stroke   [] History of TIA  [] Aphasia   [] Vissual changes   [] Weakness or numbness in arm   [] Weakness or numbness in leg Musculoskeletal:   [] Joint swelling   [] Joint pain   [] Low back pain Hematologic:  [] Easy bruising  [] Easy bleeding   [] Hypercoagulable state   [] Anemic Gastrointestinal:  [] Diarrhea   [] Vomiting  [] Gastroesophageal reflux/heartburn   [] Difficulty swallowing. Genitourinary:  [] Chronic kidney disease   [] Difficult urination  [] Frequent urination   [] Blood in urine Skin:  [] Rashes   [] Ulcers  Psychological:  [] History of anxiety   []  History of major depression.  Physical Examination  There were no vitals filed for this visit. There is no height or weight on file to calculate BMI. Gen: WD/WN, NAD Head: [place]/AT, No temporalis wasting.  Ear/Nose/Throat: Hearing grossly intact, nares w/o erythema or drainage, pinna without lesions Eyes: PER, EOMI, sclera nonicteric.  Neck: Supple, no gross masses.  No JVD.  Pulmonary:  Good air movement, no audible wheezing, no use of accessory muscles.  Cardiac: RRR, precordium not hyperdynamic. Vascular:  scattered varicosities present bilaterally.  Moderate venous stasis changes to the legs bilaterally.  2+ soft pitting edema. CEAP [id number]   Vessel Right Left  Radial Palpable Palpable  Gastrointestinal: soft, non-distended. No guarding/no peritoneal signs.  Musculoskeletal: M/S 5/5 throughout.  No deformity.  Neurologic: CN 2-12 intact. Pain and light touch intact in extremities.  Symmetrical.  Speech is fluent. Motor exam as listed above. Psychiatric: Judgment intact, Mood & affect appropriate for pt's clinical situation. Dermatologic: Venous rashes no ulcers noted.  No changes consistent with cellulitis. Lymph : No lichenification or skin changes of chronic lymphedema.  CBC Lab Results  Component Value Date   WBC 10.1 01/15/2017   HGB 14.0 01/15/2017   HCT 40.9  01/15/2017   MCV 87.8 01/15/2017   PLT 337 01/15/2017    BMET    Component Value Date/Time   NA 141 01/15/2017 0938   NA 138 07/04/2012 1319   K 3.5 01/15/2017 0938   K 3.8 07/04/2012 1319   CL 101 01/15/2017 0938   CL 104 07/04/2012 1319   CO2 30 01/15/2017 0938   CO2 27 07/04/2012 1319   GLUCOSE 82 01/15/2017 0938   GLUCOSE 98 07/04/2012 1319   BUN 9 01/15/2017 0938   BUN 9 07/04/2012 1319   CREATININE 0.60 06/21/2022 0928   CREATININE 0.58 (L) 07/04/2012 1319   CALCIUM 9.3 01/15/2017 0938   CALCIUM 8.9 07/04/2012 1319   GFRNONAA >60 09/03/2017 1203   GFRNONAA >60 07/04/2012 1319   GFRAA >60 09/03/2017 1203   GFRAA >60 07/04/2012 1319   CrCl cannot be calculated (Patient's most recent lab result is  older than the maximum 21 days allowed.).  COAG No results found for: "INR", "PROTIME"  Radiology VAS [place] LOWER EXTREMITY VENOUS REFLUX  Result Date: 10/13/2023  Lower Venous Reflux Study Patient Name:  JADALYNN BIXEL  Date of Exam:   10/09/2023 Medical Rec #: [id number]       Accession #:    [id number] Date of Birth: 10/04/1951       Patient Gender: F Patient Age:   18 years Exam Location:  [facility] Procedure:      VAS [place] LOWER EXTREMITY VENOUS REFLUX Referring Phys: Levora Dredge --------------------------------------------------------------------------------  Indications: Edema, Swelling, and Pain.  Performing Technologist: Hardie Lora RVT  Examination Guidelines: A complete evaluation includes B-mode imaging, spectral Doppler, color Doppler, and power Doppler as needed of all accessible portions of each vessel. Bilateral testing is considered an integral part of a complete examination. Limited examinations for reoccurring indications may be performed as noted. The reflux portion of the exam is performed with the patient in reverse Trendelenburg. Significant venous reflux is defined as >500 ms in the superficial venous system, and >1 second in the deep venous  system.  Venous Reflux Times +--------------+---------+------+-----------+------------+--------+ RIGHT         Reflux NoRefluxReflux TimeDiameter cmsComments                         Yes                                  +--------------+---------+------+-----------+------------+--------+ CFV           no                                             +--------------+---------+------+-----------+------------+--------+ FV prox       no                                             +--------------+---------+------+-----------+------------+--------+ FV mid        no                                             +--------------+---------+------+-----------+------------+--------+ FV dist       no                                             +--------------+---------+------+-----------+------------+--------+ Popliteal     no                                             +--------------+---------+------+-----------+------------+--------+ GSV at [facility]    no                            0.55             +--------------+---------+------+-----------+------------+--------+ GSV prox thighno  0.47             +--------------+---------+------+-----------+------------+--------+ GSV mid thigh no                            0.47             +--------------+---------+------+-----------+------------+--------+ GSV dist thighno                            0.43             +--------------+---------+------+-----------+------------+--------+ GSV at knee   no                            0.39             +--------------+---------+------+-----------+------------+--------+ GSV prox calf no                            0.47             +--------------+---------+------+-----------+------------+--------+ SSV Pop Fossa no                            0.18             +--------------+---------+------+-----------+------------+--------+ SSV prox calf  no                            0.15             +--------------+---------+------+-----------+------------+--------+ SSV mid calf  no                            0.25             +--------------+---------+------+-----------+------------+--------+  +--------------+---------+------+-----------+------------+--------+ LEFT          Reflux NoRefluxReflux TimeDiameter cmsComments                         Yes                                  +--------------+---------+------+-----------+------------+--------+ CFV           no                                             +--------------+---------+------+-----------+------------+--------+ FV prox       no                                             +--------------+---------+------+-----------+------------+--------+ FV mid        no                                             +--------------+---------+------+-----------+------------+--------+ FV dist       no                                             +--------------+---------+------+-----------+------------+--------+  Popliteal     no                                             +--------------+---------+------+-----------+------------+--------+ GSV at [facility]    no                            0.56             +--------------+---------+------+-----------+------------+--------+ GSV prox thighno                            0.47             +--------------+---------+------+-----------+------------+--------+ GSV mid thigh no                            0.48             +--------------+---------+------+-----------+------------+--------+ GSV dist thighno                            0.38             +--------------+---------+------+-----------+------------+--------+ GSV at knee   no                            0.38             +--------------+---------+------+-----------+------------+--------+ GSV prox calf no                            0.36              +--------------+---------+------+-----------+------------+--------+ SSV Pop Fossa no                            0.36             +--------------+---------+------+-----------+------------+--------+ SSV prox calf no                            0.29             +--------------+---------+------+-----------+------------+--------+ SSV mid calf  no                            0.29             +--------------+---------+------+-----------+------------+--------+   Summary: Right: - No evidence of deep vein thrombosis seen in the right lower extremity, from the common femoral through the popliteal veins. - No evidence of superficial venous thrombosis in the right lower extremity. - There is no evidence of venous reflux seen in the right lower extremity. - No evidence of superficial venous reflux seen in the right greater saphenous vein. - No evidence of superficial venous reflux seen in the right short saphenous vein.  Left: - No evidence of deep vein thrombosis seen in the left lower extremity, from the common femoral through the popliteal veins. - No evidence of superficial venous thrombosis in the left lower extremity. - No evidence of superficial venous reflux seen in the left greater saphenous vein. - No evidence of superficial venous reflux seen in the left  short saphenous vein. - Well defined anechoic structure measuring 2.38 x 2.60 cm seen at popliteal fossa consistent with Baker's cyst.  *See table(s) above for measurements and observations. Electronically signed by Levora Dredge MD on 10/13/2023 at 8:52:37 AM.    Final      Assessment/Plan There are no diagnoses linked to this encounter.   Levora Dredge, MD  11/03/2023 12:59 PM

## 2023-11-04 ENCOUNTER — Encounter (INDEPENDENT_AMBULATORY_CARE_PROVIDER_SITE_OTHER): Payer: Self-pay | Admitting: Vascular Surgery

## 2023-11-04 DIAGNOSIS — I701 Atherosclerosis of renal artery: Secondary | ICD-10-CM | POA: Insufficient documentation

## 2023-11-04 DIAGNOSIS — M47817 Spondylosis without myelopathy or radiculopathy, lumbosacral region: Secondary | ICD-10-CM | POA: Insufficient documentation

## 2023-12-24 ENCOUNTER — Encounter: Payer: Self-pay | Admitting: Acute Care

## 2024-01-31 NOTE — Progress Notes (Deleted)
 MRN : [id number]  Jessica Dalton is a 73 y.o. (1951-02-19) female who presents with chief complaint of check circulation.  History of Present Illness:   The patient returns for follow-up of painful lower extremities. Patient notes the pain is variable and not always associated with activity.  The pain is somewhat consistent day to day occurring on most days. The patient notes the pain also occurs with standing or sitting for long periods.  The extremity pain routinely seems worse as the day wears on. The pain has been progressive over the past several years. The patient states these symptoms are causing a negative impact on quality of life and daily activities which was a factor in the evaluation.   The patient has a history of back problems and DJD of the lumbar and sacral spine.  Currently she is following with Dr. Moshe Cipro in pain management at [facility].   The patient denies rest pain or dangling of an extremity off the side of the bed during the night for relief. No open wounds or sores at this time. No history of DVT or phlebitis. No prior vascular interventions or surgeries.   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   Previous venous duplex is negative for chronic changes in the deep venous system.  No evidence for deep or superficial reflux is identified.  No outpatient medications have been marked as taking for the 02/02/24 encounter (Appointment) with Gilda Crease, Latina Craver, MD.    Past Medical History:  Diagnosis Date   A-fib (HCC)    Anemia    Asthma    Cancer (HCC)    uterine ca   Chronic cough    COPD (chronic obstructive pulmonary disease) (HCC)    Dysrhythmia    ATRIAL FIB.   Endometrial cancer (HCC)    Gallstones 10/16/2016   Hypertension    Hypoxemia    Obesity     Oxygen dependent    Pneumonia    Sleep apnea    Tachycardia    Tobacco use     Past Surgical History:  Procedure Laterality Date   ABDOMINAL HYSTERECTOMY     CATARACT EXTRACTION, BILATERAL  2015   CHOLECYSTECTOMY N/A 01/23/2017   Procedure: LAPAROSCOPIC CHOLECYSTECTOMY WITH INTRAOPERATIVE CHOLANGIOGRAM;  Surgeon: Earline Mayotte, MD;  Location: [facility];  Service: General;  Laterality: N/A;   COLONOSCOPY WITH PROPOFOL N/A 09/08/2017   Procedure: COLONOSCOPY WITH PROPOFOL;  Surgeon: Scot Jun, MD;  Location: [facility];  Service: Endoscopy;  Laterality: N/A;   COLONOSCOPY WITH PROPOFOL N/A 12/22/2019   Procedure: COLONOSCOPY WITH PROPOFOL;  Surgeon: Toledo, Boykin Nearing, MD;  Location: [facility];  Service: Gastroenterology;  Laterality: N/A;   COLONOSCOPY WITH PROPOFOL N/A 08/27/2022   Procedure: COLONOSCOPY WITH PROPOFOL;  Surgeon: Regis Bill, MD;  Location: [facility];  Service: Endoscopy;  Laterality: N/A;   ESOPHAGOGASTRODUODENOSCOPY (EGD) WITH PROPOFOL N/A 09/08/2017   Procedure: ESOPHAGOGASTRODUODENOSCOPY (EGD) WITH PROPOFOL;  Surgeon: Scot Jun, MD;  Location: [facility];  Service: Endoscopy;  Laterality: N/A;   EYE SURGERY     PELVIC LYMPH NODE DISSECTION N/A 10/16/2016   Procedure: PELVIC LYMPH NODE DISSECTION;  Surgeon: Artelia Laroche, MD;  Location: [facility];  Service: Gynecology;  Laterality: N/A;   ROBOTIC ASSISTED TOTAL HYSTERECTOMY WITH BILATERAL SALPINGO OOPHERECTOMY Bilateral 10/16/2016   Procedure: ROBOTIC ASSISTED TOTAL HYSTERECTOMY WITH BILATERAL SALPINGO OOPHORECTOMY;  Surgeon: Artelia Laroche, MD;  Location: [facility];  Service: Gynecology;  Laterality: Bilateral;   SENTINEL NODE BIOPSY N/A 10/16/2016   Procedure: SENTINEL NODE INJECTION;  Surgeon: Artelia Laroche, MD;  Location: [facility];  Service: Gynecology;  Laterality: N/A;   TUBAL LIGATION      Social History Social History   Tobacco Use    Smoking status: Every Day    Current packs/day: 0.50    Average packs/day: 0.5 packs/day for 54.0 years (27.0 ttl pk-yrs)    Types: Cigarettes   Smokeless tobacco: Never   Tobacco comments:    74 pk yr - 1.5 ppd; 49 years; started age 35  Vaping Use   Vaping status: Former  Substance Use Topics   Alcohol use: No   Drug use: No    Family History Family History  Problem Relation Age of Onset   Kidney cancer Mother 29       deceased 60   Colon cancer Father 8       deceased 37   Melanoma Sister 77       deceased 44   Uterine cancer Sister 2   Cancer Paternal Grandmother        unk. age or type   Cancer Other        maternal and paternal aunts/uncles with cancer; no specifics available   Breast cancer Paternal Aunt     Allergies  Allergen Reactions   Bismuth Subsalicylate Nausea And Vomiting     REVIEW OF SYSTEMS (Negative unless checked)  Constitutional: [] Weight loss  [] Fever  [] Chills Cardiac: [] Chest pain   [] Chest pressure   [] Palpitations   [] Shortness of breath when laying flat   [] Shortness of breath with exertion. Vascular:  [x] Pain in legs with walking   [] Pain in legs at rest  [] History of DVT   [] Phlebitis   [] Swelling in legs   [] Varicose veins   [] Non-healing ulcers Pulmonary:   [] Uses home oxygen   [] Productive cough   [] Hemoptysis   [] Wheeze  [] COPD   [] Asthma Neurologic:  [] Dizziness   [] Seizures   [] History of stroke   [] History of TIA  [] Aphasia   [] Vissual changes   [] Weakness or numbness in arm   [] Weakness or numbness in leg Musculoskeletal:   [] Joint swelling   [] Joint pain   [] Low back pain Hematologic:  [] Easy bruising  [] Easy bleeding   [] Hypercoagulable state   [] Anemic Gastrointestinal:  [] Diarrhea   [] Vomiting  [] Gastroesophageal reflux/heartburn   [] Difficulty swallowing. Genitourinary:  [] Chronic kidney disease   [] Difficult urination  [] Frequent urination   [] Blood in urine Skin:  [] Rashes   [] Ulcers  Psychological:  [] History of anxiety    []  History of major depression.  Physical Examination  There were no vitals filed for this visit. There is no height or weight on file to calculate BMI. Gen: WD/WN, NAD Head: Wise/AT, No temporalis wasting.  Ear/Nose/Throat: Hearing grossly intact, nares w/o erythema or drainage Eyes: PER, EOMI, sclera nonicteric.  Neck: Supple, no masses.  No bruit or JVD.  Pulmonary:  Good air movement, no audible  wheezing, no use of accessory muscles.  Cardiac: RRR, normal S1, S2, no Murmurs. Vascular:  mild trophic changes, no open wounds Vessel Right Left  Radial Palpable Palpable  PT Not Palpable Not Palpable  DP Not Palpable Not Palpable  Gastrointestinal: soft, non-distended. No guarding/no peritoneal signs.  Musculoskeletal: M/S 5/5 throughout.  No visible deformity.  Neurologic: CN 2-12 intact. Pain and light touch intact in extremities.  Symmetrical.  Speech is fluent. Motor exam as listed above. Psychiatric: Judgment intact, Mood & affect appropriate for pt's clinical situation. Dermatologic: No rashes or ulcers noted.  No changes consistent with cellulitis.   CBC Lab Results  Component Value Date   WBC 10.1 01/15/2017   HGB 14.0 01/15/2017   HCT 40.9 01/15/2017   MCV 87.8 01/15/2017   PLT 337 01/15/2017    BMET    Component Value Date/Time   NA 141 01/15/2017 0938   NA 138 07/04/2012 1319   K 3.5 01/15/2017 0938   K 3.8 07/04/2012 1319   CL 101 01/15/2017 0938   CL 104 07/04/2012 1319   CO2 30 01/15/2017 0938   CO2 27 07/04/2012 1319   GLUCOSE 82 01/15/2017 0938   GLUCOSE 98 07/04/2012 1319   BUN 9 01/15/2017 0938   BUN 9 07/04/2012 1319   CREATININE 0.60 06/21/2022 0928   CREATININE 0.58 (L) 07/04/2012 1319   CALCIUM 9.3 01/15/2017 0938   CALCIUM 8.9 07/04/2012 1319   GFRNONAA >60 09/03/2017 1203   GFRNONAA >60 07/04/2012 1319   GFRAA >60 09/03/2017 1203   GFRAA >60 07/04/2012 1319   CrCl cannot be calculated (Patient's most recent lab result is older than the  maximum 21 days allowed.).  COAG No results found for: "INR", "PROTIME"  Radiology No results found.   Assessment/Plan There are no diagnoses linked to this encounter.   Levora Dredge, MD  01/31/2024 4:39 PM

## 2024-02-02 ENCOUNTER — Ambulatory Visit (INDEPENDENT_AMBULATORY_CARE_PROVIDER_SITE_OTHER): Payer: Medicare HMO | Admitting: Vascular Surgery

## 2024-02-02 ENCOUNTER — Encounter (INDEPENDENT_AMBULATORY_CARE_PROVIDER_SITE_OTHER): Payer: Medicare HMO

## 2024-02-02 DIAGNOSIS — I2510 Atherosclerotic heart disease of native coronary artery without angina pectoris: Secondary | ICD-10-CM

## 2024-02-02 DIAGNOSIS — I890 Lymphedema, not elsewhere classified: Secondary | ICD-10-CM

## 2024-02-02 DIAGNOSIS — I10 Essential (primary) hypertension: Secondary | ICD-10-CM

## 2024-02-02 DIAGNOSIS — I70213 Atherosclerosis of native arteries of extremities with intermittent claudication, bilateral legs: Secondary | ICD-10-CM

## 2024-02-02 DIAGNOSIS — I480 Paroxysmal atrial fibrillation: Secondary | ICD-10-CM

## 2024-02-02 DIAGNOSIS — I701 Atherosclerosis of renal artery: Secondary | ICD-10-CM

## 2024-03-15 ENCOUNTER — Other Ambulatory Visit: Payer: Self-pay | Admitting: Pulmonary Disease

## 2024-03-15 DIAGNOSIS — J449 Chronic obstructive pulmonary disease, unspecified: Secondary | ICD-10-CM

## 2024-03-15 DIAGNOSIS — J42 Unspecified chronic bronchitis: Secondary | ICD-10-CM

## 2024-03-29 ENCOUNTER — Ambulatory Visit
Admission: RE | Admit: 2024-03-29 | Discharge: 2024-03-29 | Disposition: A | Discharge status: Home / Self Care | Source: Ambulatory Visit | Attending: Pulmonary Disease | Admitting: Pulmonary Disease

## 2024-03-29 ENCOUNTER — Other Ambulatory Visit: Payer: Self-pay | Admitting: Pulmonary Disease

## 2024-03-29 DIAGNOSIS — J449 Chronic obstructive pulmonary disease, unspecified: Secondary | ICD-10-CM

## 2024-03-29 DIAGNOSIS — J42 Unspecified chronic bronchitis: Secondary | ICD-10-CM | POA: Diagnosis present

## 2024-04-27 ENCOUNTER — Encounter (INDEPENDENT_AMBULATORY_CARE_PROVIDER_SITE_OTHER): Payer: Self-pay

## 2024-11-08 ENCOUNTER — Other Ambulatory Visit: Payer: Self-pay | Admitting: Pulmonary Disease

## 2024-11-08 DIAGNOSIS — J449 Chronic obstructive pulmonary disease, unspecified: Secondary | ICD-10-CM

## 2024-11-24 ENCOUNTER — Ambulatory Visit: Admission: RE | Admit: 2024-11-24 | Discharge: 2024-11-24 | Attending: Pulmonary Disease

## 2024-11-24 DIAGNOSIS — J449 Chronic obstructive pulmonary disease, unspecified: Secondary | ICD-10-CM | POA: Diagnosis present

## 2024-11-26 ENCOUNTER — Other Ambulatory Visit: Payer: Self-pay | Admitting: Gastroenterology

## 2024-11-26 DIAGNOSIS — G8929 Other chronic pain: Secondary | ICD-10-CM

## 2024-11-26 DIAGNOSIS — K760 Fatty (change of) liver, not elsewhere classified: Secondary | ICD-10-CM

## 2024-11-29 ENCOUNTER — Ambulatory Visit
Admission: RE | Admit: 2024-11-29 | Discharge: 2024-11-29 | Disposition: A | Discharge status: Home / Self Care | Source: Ambulatory Visit | Attending: Gastroenterology | Admitting: Gastroenterology

## 2024-11-29 DIAGNOSIS — R1031 Right lower quadrant pain: Secondary | ICD-10-CM | POA: Insufficient documentation

## 2024-11-29 DIAGNOSIS — G8929 Other chronic pain: Secondary | ICD-10-CM | POA: Diagnosis present

## 2024-11-29 DIAGNOSIS — K760 Fatty (change of) liver, not elsewhere classified: Secondary | ICD-10-CM | POA: Insufficient documentation

## 2024-11-29 MED ORDER — IOHEXOL 300 MG/ML  SOLN
100.0000 mL | Freq: Once | INTRAMUSCULAR | Status: AC | PRN
  Administered 2024-11-29: 100 mL via INTRAVENOUS
